# Patient Record
Sex: Male | Born: 1965 | Race: Black or African American | Hispanic: No | Smoking: Never smoker
Health system: Southern US, Community
[De-identification: ages and names within clinical notes are randomized; demographics above are authoritative.]

## PROBLEM LIST (undated history)

## (undated) DIAGNOSIS — I10 Essential (primary) hypertension: Secondary | ICD-10-CM

## (undated) DIAGNOSIS — I639 Cerebral infarction, unspecified: Secondary | ICD-10-CM

## (undated) DIAGNOSIS — E119 Type 2 diabetes mellitus without complications: Secondary | ICD-10-CM

## (undated) HISTORY — DX: Type 2 diabetes mellitus without complications: E11.9

## (undated) HISTORY — DX: Cerebral infarction, unspecified: I63.9

## (undated) HISTORY — PX: CHOLECYSTECTOMY: SHX55

---

## 2016-07-21 ENCOUNTER — Ambulatory Visit: Payer: Self-pay

## 2016-07-21 ENCOUNTER — Other Ambulatory Visit: Payer: Self-pay | Admitting: Occupational Medicine

## 2016-07-21 DIAGNOSIS — M79641 Pain in right hand: Secondary | ICD-10-CM

## 2019-08-31 ENCOUNTER — Encounter (HOSPITAL_COMMUNITY): Payer: Self-pay

## 2019-08-31 ENCOUNTER — Emergency Department (HOSPITAL_COMMUNITY)
Admission: EM | Admit: 2019-08-31 | Discharge: 2019-08-31 | Disposition: A | Discharge status: Home / Self Care | Payer: BC Managed Care – PPO | Attending: Emergency Medicine | Admitting: Emergency Medicine

## 2019-08-31 ENCOUNTER — Other Ambulatory Visit: Payer: Self-pay

## 2019-08-31 DIAGNOSIS — R509 Fever, unspecified: Secondary | ICD-10-CM | POA: Diagnosis not present

## 2019-08-31 DIAGNOSIS — M791 Myalgia, unspecified site: Secondary | ICD-10-CM | POA: Diagnosis not present

## 2019-08-31 DIAGNOSIS — Z20822 Contact with and (suspected) exposure to covid-19: Secondary | ICD-10-CM | POA: Insufficient documentation

## 2019-08-31 DIAGNOSIS — I10 Essential (primary) hypertension: Secondary | ICD-10-CM | POA: Insufficient documentation

## 2019-08-31 DIAGNOSIS — R519 Headache, unspecified: Secondary | ICD-10-CM | POA: Diagnosis not present

## 2019-08-31 DIAGNOSIS — Z79899 Other long term (current) drug therapy: Secondary | ICD-10-CM | POA: Diagnosis not present

## 2019-08-31 HISTORY — DX: Essential (primary) hypertension: I10

## 2019-08-31 LAB — SARS CORONAVIRUS 2 BY RT PCR (HOSPITAL ORDER, PERFORMED IN ~~LOC~~ HOSPITAL LAB): SARS Coronavirus 2: NEGATIVE

## 2019-08-31 MED ORDER — DOXYCYCLINE HYCLATE 100 MG PO CAPS: 100.0000 mg | ORAL_CAPSULE | Freq: Two times a day (BID) | ORAL | 0 refills | Status: AC

## 2019-08-31 MED ORDER — AMLODIPINE BESYLATE 10 MG PO TABS: 10.0000 mg | ORAL_TABLET | Freq: Every day | ORAL | 0 refills | Status: DC

## 2019-08-31 MED ORDER — ACETAMINOPHEN 500 MG PO TABS
1000.0000 mg | ORAL_TABLET | Freq: Once | ORAL | Status: AC
  Administered 2019-08-31: 18:00:00 1000 mg via ORAL
  Filled 2019-08-31: qty 2

## 2019-08-31 MED ORDER — AMLODIPINE BESYLATE 5 MG PO TABS
10.0000 mg | ORAL_TABLET | Freq: Once | ORAL | Status: AC
  Administered 2019-08-31: 18:00:00 10 mg via ORAL
  Filled 2019-08-31: qty 2

## 2019-08-31 MED ORDER — HYDROCHLOROTHIAZIDE 12.5 MG PO CAPS
25.0000 mg | ORAL_CAPSULE | Freq: Once | ORAL | Status: AC
  Administered 2019-08-31: 18:00:00 25 mg via ORAL
  Filled 2019-08-31: qty 2

## 2019-08-31 MED ORDER — HYDROCHLOROTHIAZIDE 25 MG PO TABS: 25.0000 mg | ORAL_TABLET | Freq: Every day | ORAL | 0 refills | Status: DC

## 2019-08-31 NOTE — Discharge Instructions (Addendum)
You were given a prescription for antibiotics. Please take the antibiotic prescription fully.   Please follow up with your primary care provider within 5-7 days for re-evaluation of your symptoms. If you do not have a primary care provider, information for a healthcare clinic has been provided for you to make arrangements for follow up care. Please return to the emergency department for any new or worsening symptoms.  

## 2019-08-31 NOTE — ED Triage Notes (Signed)
She c/o aches and uri sx x 1-2 days. She is in no distress.

## 2019-08-31 NOTE — ED Provider Notes (Signed)
[facility]-EMERGENCY DEPT Provider Note   CSN: [id number] Arrival date & time: 08/31/19  1521     History Chief Complaint  Patient presents with   Fever    [name] [name] is a 54 y.o. male.  HPI   Pt is a 54 y/o male who history of gout, hypertension, who presents the emergency department today for evaluation of generalized body aches and headache.  States symptoms started about a week ago, headache located to the right side of the head.  Symptoms started gradually and have been intermittent since onset.  Rates pain 5-6/10.  Denies worst headache of life. Denies vision changes, facial droop, speech difficulty, numbness/weakness, dizziness, lightheadedness. Denies rhinorrhea, congestion, sore throat, loss of taste and smell, cough, chest pain, shortness of breath, NVD, abd pain, dysuria, frequency.  Denies h/o IVDU or HIV  Past Medical History:  Diagnosis Date   Hypertension     There are no problems to display for this patient.   Past Surgical History:  Procedure Laterality Date   CHOLECYSTECTOMY         History reviewed. No pertinent family history.  Social History   Tobacco Use   Smoking status: Never Smoker   Smokeless tobacco: Never Used  Substance Use Topics   Alcohol use: Never   Drug use: Not on file    Home Medications Prior to Admission medications   Medication Sig Start Date End Date Taking? Authorizing Provider  amLODipine (NORVASC) 10 MG tablet Take 1 tablet (10 mg total) by mouth daily. 08/31/19 09/30/19  Ethyn Schetter S, PA-C  doxycycline (VIBRAMYCIN) 100 MG capsule Take 1 capsule (100 mg total) by mouth 2 (two) times daily for 7 days. 08/31/19 09/07/19  Laveyah Oriol S, PA-C  hydrochlorothiazide (HYDRODIURIL) 25 MG tablet Take 1 tablet (25 mg total) by mouth daily. 08/31/19 09/30/19  Calayah Guadarrama S, PA-C    Allergies    Patient has no known allergies.  Review of Systems   Review of Systems  Constitutional: Negative  for fever.  HENT: Negative for ear pain and sore throat.   Eyes: Negative for visual disturbance.  Respiratory: Negative for cough and shortness of breath.   Cardiovascular: Negative for chest pain.  Gastrointestinal: Negative for abdominal pain, constipation, diarrhea, nausea and vomiting.  Genitourinary: Negative for dysuria and hematuria.  Musculoskeletal: Positive for myalgias.  Skin: Negative for rash.  Neurological: Positive for headaches. Negative for dizziness, syncope, speech difficulty, weakness, light-headedness and numbness.  All other systems reviewed and are negative.   Physical Exam Updated Vital Signs BP (!) 158/118 (BP Location: Left Arm)    Pulse 98    Temp 100.2 F (37.9 C) (Oral)    Resp 18    Ht 6' (1.829 m)    Wt (!) 157.3 kg    SpO2 96%    BMI 47.03 kg/m   Physical Exam Vitals and nursing note reviewed.  Constitutional:      Appearance: He is well-developed.  HENT:     Head: Normocephalic and atraumatic.  Eyes:     Extraocular Movements: Extraocular movements intact.     Conjunctiva/sclera: Conjunctivae normal.     Pupils: Pupils are equal, round, and reactive to light.  Neck:     Comments: No nuchal rigidity Cardiovascular:     Rate and Rhythm: Normal rate and regular rhythm.     Heart sounds: Normal heart sounds. No murmur heard.   Pulmonary:     Effort: Pulmonary effort is normal. No respiratory distress.  Breath sounds: Normal breath sounds. No wheezing, rhonchi or rales.  Abdominal:     General: Bowel sounds are normal.     Palpations: Abdomen is soft.     Tenderness: There is no abdominal tenderness. There is no guarding or rebound.  Musculoskeletal:     Cervical back: Neck supple.  Skin:    General: Skin is warm and dry.  Neurological:     Mental Status: He is alert.     Comments: Mental Status:  Alert, thought content appropriate, able to give a coherent history. Speech fluent without evidence of aphasia. Able to follow 2 step  commands without difficulty.  Cranial Nerves:  II: pupils equal, round, reactive to light III,IV, VI: ptosis not present, extra-ocular motions intact bilaterally  V,VII: smile symmetric, facial light touch sensation equal VIII: hearing grossly normal to voice  X: uvula elevates symmetrically  XI: bilateral shoulder shrug symmetric and strong XII: midline tongue extension without fassiculations Motor:  Normal tone. 5/5 strength of BUE and BLE major muscle groups including strong and equal grip strength and dorsiflexion/plantar flexion Sensory: light touch normal in all extremities.      ED Results / Procedures / Treatments   Labs (all labs ordered are listed, but only abnormal results are displayed) Labs Reviewed  SARS CORONAVIRUS 2 BY RT PCR (HOSPITAL ORDER, PERFORMED IN [facility])    EKG None  Radiology No results found.  Procedures Procedures (including critical care time)  Medications Ordered in ED Medications  amLODipine (NORVASC) tablet 10 mg (10 mg Oral Given 08/31/19 1748)  hydrochlorothiazide (MICROZIDE) capsule 25 mg (25 mg Oral Given 08/31/19 1748)  acetaminophen (TYLENOL) tablet 1,000 mg (1,000 mg Oral Given 08/31/19 1748)    ED Course  I have reviewed the triage vital signs and the nursing notes.  Pertinent labs & imaging results that were available during my care of the patient were reviewed by me and considered in my medical decision making (see chart for details).    MDM Rules/Calculators/A&P                          54 year old male presenting for evaluation of headache and body aches for the last week.  Denies any other associated symptoms.  Was noted to have significantly elevated blood pressure initially.  Was also noted to be borderline febrile and marginally tachycardic.  Reviewed records per care everywhere, patient has had hypertension at multiple of his prior visits in the same range as noted today.  He reports he has been out of his  blood pressure medications for the last 3 months due to traveling for his job.  He is due to see his PCP in 5 days.  Denies any neurologic complaints other than headache.  Neuro exam is normal.  No chest pain, shortness of breath, or other symptoms to suggest hypertensive emergency at this time.  Suspect that he may have viral illness such as coronavirus and will test for such.  We will also administer his blood pressure medications and give Tylenol.  Will recheck BP.  COVID neg  Rechecked BP after meds/tylenol and BP improving. HA also improving. States he may have been exposed to a tick bite as he noticed an abnormality to the skin on his right thigh a few days ago and pulled something off of it. We will cover his from [facility] with doxy. He does not have any other infectious sxs at this time and is well  appearing on exam. Have advised close f/u with pcp and strict return precautions. He voices understanding of the plan and reasons to return. All questions answered, pt stable for discharge.   Final Clinical Impression(s) / ED Diagnoses Final diagnoses:  Febrile illness    Rx / DC Orders ED Discharge Orders         Ordered    amLODipine (NORVASC) 10 MG tablet  Daily     Discontinue  Reprint     08/31/19 1824    hydrochlorothiazide (HYDRODIURIL) 25 MG tablet  Daily     Discontinue  Reprint     08/31/19 1824    doxycycline (VIBRAMYCIN) 100 MG capsule  2 times daily     Discontinue  Reprint     08/31/19 1824           Sabreena Vogan S, PA-C 08/31/19 1826    Alvira Monday, MD 09/02/19 2056

## 2020-02-19 ENCOUNTER — Encounter (HOSPITAL_COMMUNITY): Payer: Self-pay

## 2020-02-19 ENCOUNTER — Other Ambulatory Visit: Payer: Self-pay

## 2020-02-19 DIAGNOSIS — Z79899 Other long term (current) drug therapy: Secondary | ICD-10-CM | POA: Diagnosis not present

## 2020-02-19 DIAGNOSIS — L03116 Cellulitis of left lower limb: Secondary | ICD-10-CM | POA: Diagnosis not present

## 2020-02-19 DIAGNOSIS — U071 COVID-19: Secondary | ICD-10-CM | POA: Insufficient documentation

## 2020-02-19 DIAGNOSIS — I10 Essential (primary) hypertension: Secondary | ICD-10-CM | POA: Insufficient documentation

## 2020-02-19 DIAGNOSIS — R059 Cough, unspecified: Secondary | ICD-10-CM | POA: Diagnosis present

## 2020-02-19 LAB — CBC WITH DIFFERENTIAL/PLATELET
Abs Immature Granulocytes: 0.04 10*3/uL (ref 0.00–0.07)
Basophils Absolute: 0 10*3/uL (ref 0.0–0.1)
Basophils Relative: 0 %
Eosinophils Absolute: 0.1 10*3/uL (ref 0.0–0.5)
Eosinophils Relative: 1 %
HCT: 40.5 % (ref 39.0–52.0)
Hemoglobin: 13.5 g/dL (ref 13.0–17.0)
Immature Granulocytes: 1 %
Lymphocytes Relative: 17 %
Lymphs Abs: 1.1 10*3/uL (ref 0.7–4.0)
MCH: 30 pg (ref 26.0–34.0)
MCHC: 33.3 g/dL (ref 30.0–36.0)
MCV: 90 fL (ref 80.0–100.0)
Monocytes Absolute: 0.6 10*3/uL (ref 0.1–1.0)
Monocytes Relative: 9 %
Neutro Abs: 4.6 10*3/uL (ref 1.7–7.7)
Neutrophils Relative %: 72 %
Platelets: 203 10*3/uL (ref 150–400)
RBC: 4.5 MIL/uL (ref 4.22–5.81)
RDW: 13.7 % (ref 11.5–15.5)
WBC: 6.4 10*3/uL (ref 4.0–10.5)
nRBC: 0 % (ref 0.0–0.2)

## 2020-02-19 LAB — COMPREHENSIVE METABOLIC PANEL
ALT: 61 U/L — ABNORMAL HIGH (ref 0–44)
AST: 58 U/L — ABNORMAL HIGH (ref 15–41)
Albumin: 3.4 g/dL — ABNORMAL LOW (ref 3.5–5.0)
Alkaline Phosphatase: 61 U/L (ref 38–126)
Anion gap: 11 (ref 5–15)
BUN: 22 mg/dL — ABNORMAL HIGH (ref 6–20)
CO2: 26 mmol/L (ref 22–32)
Calcium: 8.6 mg/dL — ABNORMAL LOW (ref 8.9–10.3)
Chloride: 100 mmol/L (ref 98–111)
Creatinine, Ser: 1.68 mg/dL — ABNORMAL HIGH (ref 0.61–1.24)
GFR, Estimated: 48 mL/min — ABNORMAL LOW (ref >60)
Glucose, Bld: 220 mg/dL — ABNORMAL HIGH (ref 70–99)
Potassium: 4 mmol/L (ref 3.5–5.1)
Sodium: 137 mmol/L (ref 135–145)
Total Bilirubin: 1.1 mg/dL (ref 0.3–1.2)
Total Protein: 7.6 g/dL (ref 6.5–8.1)

## 2020-02-19 LAB — LACTIC ACID, PLASMA
Lactic Acid, Venous: 1.2 mmol/L (ref 0.5–1.9)
Lactic Acid, Venous: 1 mmol/L (ref 0.5–1.9)

## 2020-02-19 NOTE — ED Triage Notes (Signed)
Pt arrived via walk in, c/o left leg wound infection. States he had two open areas from work. Was seen at [facility], given abx, last dose x2 days ago. States it is draining the last couple 24 hrs.

## 2020-02-20 ENCOUNTER — Emergency Department (HOSPITAL_COMMUNITY)
Admission: EM | Admit: 2020-02-20 | Discharge: 2020-02-20 | Disposition: A | Discharge status: Home / Self Care | Payer: BC Managed Care – PPO | Attending: Emergency Medicine | Admitting: Emergency Medicine

## 2020-02-20 DIAGNOSIS — R0602 Shortness of breath: Secondary | ICD-10-CM

## 2020-02-20 DIAGNOSIS — R509 Fever, unspecified: Secondary | ICD-10-CM

## 2020-02-20 DIAGNOSIS — L03116 Cellulitis of left lower limb: Secondary | ICD-10-CM

## 2020-02-20 LAB — SARS CORONAVIRUS 2 (TAT 6-24 HRS): SARS Coronavirus 2: POSITIVE — AB

## 2020-02-20 MED ORDER — DOXYCYCLINE HYCLATE 100 MG PO TABS
100.0000 mg | ORAL_TABLET | Freq: Once | ORAL | Status: AC
  Administered 2020-02-20: 100 mg via ORAL
  Filled 2020-02-20: qty 1

## 2020-02-20 MED ORDER — DOXYCYCLINE HYCLATE 100 MG PO CAPS: 100.0000 mg | ORAL_CAPSULE | Freq: Two times a day (BID) | ORAL | 0 refills | Status: AC

## 2020-02-20 NOTE — ED Provider Notes (Signed)
[facility]-EMERGENCY DEPT Provider Note   CSN: [id number] Arrival date & time: 02/19/20  1516     History Chief Complaint  Patient presents with  . Wound Infection    [name] [name] is a 55 y.o. male with a history of hypertension who presents the emergency department with a chief complaint of left leg wound.  The patient reports that he sustained 2 cuts on his left lower leg while he was at work on December 12.  He was cleaning the area with alcohol and peroxide, but the wound did not improve so he went to urgent care on December 19 and was started on a 10-day course of Keflex.  He reports that he was compliant with the course of Keflex, but has continued to have sharp, burning, nonradiating pain to the wound on the left lower leg and the wound has not healed.  He denies redness, warmth, red streaking up the leg, numbness, weakness, or discharge from the wound.  He denies a history of previous nonhealing wounds.  No history of diabetes mellitus.  He also notes that he had a fever of 100.4 on 01/04.  He also reports that he has had some mild shortness of breath over the last week and a nonproductive cough.  No loss of sense of taste or smell, chest pain, abdominal pain, leg swelling, sore throat, or other URI symptoms.  He is unvaccinated against COVID-19.  He has unsure of any known, sick contacts.   The history is provided by the patient and medical records. No language interpreter was used.       Past Medical History:  Diagnosis Date  . Hypertension     There are no problems to display for this patient.   Past Surgical History:  Procedure Laterality Date  . CHOLECYSTECTOMY         History reviewed. No pertinent family history.  Social History   Tobacco Use  . Smoking status: Never Smoker  . Smokeless tobacco: Never Used  Substance Use Topics  . Alcohol use: Never    Home Medications Prior to Admission medications   Medication Sig Start Date End  Date Taking? Authorizing Provider  doxycycline (VIBRAMYCIN) 100 MG capsule Take 1 capsule (100 mg total) by mouth 2 (two) times daily for 7 days. 02/20/20 02/27/20 Yes Naveh Rickles A, PA-C  amLODipine (NORVASC) 10 MG tablet Take 1 tablet (10 mg total) by mouth daily. 08/31/19 09/30/19  Couture, Cortni S, PA-C  hydrochlorothiazide (HYDRODIURIL) 25 MG tablet Take 1 tablet (25 mg total) by mouth daily. 08/31/19 09/30/19  Couture, Cortni S, PA-C    Allergies    Patient has no known allergies.  Review of Systems   Review of Systems  Constitutional: Positive for fever. Negative for appetite change.  HENT: Negative for congestion and sore throat.   Respiratory: Positive for cough and shortness of breath. Negative for wheezing.   Cardiovascular: Negative for chest pain.  Gastrointestinal: Negative for abdominal pain, constipation, diarrhea, nausea and vomiting.  Genitourinary: Negative for dysuria.  Musculoskeletal: Positive for myalgias. Negative for arthralgias, back pain and gait problem.  Skin: Positive for wound. Negative for color change and rash.  Allergic/Immunologic: Negative for immunocompromised state.  Neurological: Negative for syncope, weakness, numbness and headaches.  Psychiatric/Behavioral: Negative for confusion.    Physical Exam Updated Vital Signs BP (!) 185/106 (BP Location: Left Arm)   Pulse 97   Temp 99.3 F (37.4 C) (Oral)   Resp (!) 27   Ht 6' (  1.829 m)   Wt (!) 166.5 kg   SpO2 95%   BMI 49.77 kg/m   Physical Exam Vitals and nursing note reviewed.  Constitutional:      Appearance: He is well-developed.     Comments: Morbidly obese  HENT:     Head: Normocephalic.  Eyes:     Conjunctiva/sclera: Conjunctivae normal.  Cardiovascular:     Rate and Rhythm: Normal rate and regular rhythm.     Heart sounds: No murmur heard.   Pulmonary:     Effort: Pulmonary effort is normal. No respiratory distress.     Breath sounds: Normal breath sounds. No stridor. No  wheezing, rhonchi or rales.     Comments: Lungs are clear to auscultation bilaterally.  No increased work of breathing. Chest:     Chest wall: No tenderness.  Abdominal:     General: There is no distension.     Palpations: Abdomen is soft.  Musculoskeletal:     Cervical back: Neck supple.     Comments: Chronic appearing lymphedema noted to the bilateral lower extremities.  3+ pitting edema bilaterally.  Neurovascularly intact throughout the bilateral lower extremities.  Skin:    General: Skin is warm and dry.     Comments: There are 2 superficial skin ulceration noted to the left lower leg with a small amount of surrounding erythema, but no warmth.  Erythema is not circumferential.  No red streaking.  No active drainage or drainage able to be expressed.  No fluctuance.  No induration.  Neurological:     Mental Status: He is alert.  Psychiatric:        Behavior: Behavior normal.          ED Results / Procedures / Treatments   Labs (all labs ordered are listed, but only abnormal results are displayed) Labs Reviewed  COMPREHENSIVE METABOLIC PANEL - Abnormal; Notable for the following components:      Result Value   Glucose, Bld 220 (*)    BUN 22 (*)    Creatinine, Ser 1.68 (*)    Calcium 8.6 (*)    Albumin 3.4 (*)    AST 58 (*)    ALT 61 (*)    GFR, Estimated 48 (*)    All other components within normal limits  SARS CORONAVIRUS 2 (TAT 6-24 HRS)  LACTIC ACID, PLASMA  LACTIC ACID, PLASMA  CBC WITH DIFFERENTIAL/PLATELET    EKG None  Radiology No results found.  Procedures Procedures (including critical care time)  Medications Ordered in ED Medications  doxycycline (VIBRA-TABS) tablet 100 mg (100 mg Oral Given 02/20/20 5621)    ED Course  I have reviewed the triage vital signs and the nursing notes.  Pertinent labs & imaging results that were available during my care of the patient were reviewed by me and considered in my medical decision making (see chart for  details).    MDM Rules/Calculators/A&P                          55 year old male with a history of hypertension who presents to the emergency department with 2 nonhealing wounds to the left lower leg after he was cut by a piece of glass on December 12.  He was treated with a 10-day course of Keflex, but wounds have not resolved.  He reports they have not been worsening, but he did have a fever 2 days ago.  Notably, he mentions mild shortness of breath that began  about a week ago and a nonproductive cough.  Labs have been reviewed and independently interpreted by me.  Glucose is 220.  Patient has no history of diabetes mellitus.  I have reviewed his recent A1c's.  He does report that he drank 2 sodas during his extended wait in the emergency department due to boarding.  Creatinine is 1.68, stable from previous.  He has no leukocytosis.  Lactate is normal.  I performed a bedside ultrasound, but was unable to save the images.  There is a small amount of cobblestoning, but no fluid collection concerning for an abscess.  I think it is reasonable to treat the patient with a course of Doxy cyclin and have him follow closely with primary care and/or wound care.  In regard to his fever, I have a low suspicion that it is from his left leg wound as he reports that it has not been worsening, but just has not resolved.  Given that he has had some mild shortness of breath and cough over the last week, I am suspicious for COVID-19 and a COVID-19 test has been ordered and is pending.  He has had no chest pain.  Patient is aware that test is pending.  He has been advised to check his MyChart account or that he may receive a call from the hospital if his test is positive.  He has been advised that he must remain out of work until he receives the results of his COVID-19 test.  Doubt abscess, myositis, osteomyelitis, bacteremia.  His first dose of doxycycline has been administered in the ER.  Home wound care  instructions discussed.  He is hemodynamically stable and in no acute distress.  Safe for discharge to home with outpatient follow-up as indicated.   Final Clinical Impression(s) / ED Diagnoses Final diagnoses:  Cellulitis of left leg  Fever, unspecified fever cause  Shortness of breath    Rx / DC Orders ED Discharge Orders         Ordered    doxycycline (VIBRAMYCIN) 100 MG capsule  2 times daily        02/20/20 0331           Joline Maxcy A, PA-C 02/20/20 0341    Fatima Blank, MD 02/20/20 1845

## 2020-02-20 NOTE — Discharge Instructions (Signed)
Thank you for allowing me to care for you today in the Emergency Department.   You are seen today for a wound on your left leg.  Your bedside ultrasound did not show an abscess.  You were given a dose of doxycycline.  You will be sent home with a 7-day course of doxycycline.  Take 1 tablet by mouth 2 times daily until you finish the entire course.  You can take 650 mg of Tylenol once every 6 hours for pain.  I would avoid NSAID medications, such as ibuprofen or naproxen, because this can be harmful to your kidneys.  Elevate your leg so that your toes are at or above the level of your nose to help with pain and swelling.  You can apply an ice pack or heating pad to the area for 15 to 20 minutes up to 3-4 times a day.  Gently clean the area with warm water and soap once daily.  Pat the area dry then cover it with a bandage.  Avoid putting peroxide and alcohol on the wound as this may cause worsening damage to the skin.  Please follow-up with primary care for a wound recheck.  However, if you finish the entire course of antibiotics and the wound does not start healing, you can follow-up with wound care.  Their office information is listed above.  Since he did have a fever 1 day earlier this week and have been having some mild shortness of breath over the last week, you were tested for COVID-19 today.  Your test result is pending.  You should receive a call from the hospital if the test is positive.  You can also check the results in MyChart.  Instructions on MyChart are attached along with your discharge paperwork.  If the test is positive, you must quarantine at home for 10 days after your symptoms began.  Since she started having some mild shortness of breath a week ago, you would need to quarantine for 10 days from that time.  If your test is negative, you can return to work on Friday.  If you do test positive for COVID-19, avoid public spaces.  Try to social distance at least 6 feet and wear a mask  around other individuals.  He should return to the emergency department if you start having persistent fevers with worsening shortness of breath, redness, worsening swelling to your leg, red streaking up the leg, thick, mucus-like drainage from the wound, or other new, concerning symptoms

## 2020-03-25 ENCOUNTER — Other Ambulatory Visit: Payer: Self-pay

## 2020-03-25 ENCOUNTER — Encounter (HOSPITAL_BASED_OUTPATIENT_CLINIC_OR_DEPARTMENT_OTHER): Payer: BC Managed Care – PPO | Attending: Physician Assistant | Admitting: Physician Assistant

## 2020-03-25 DIAGNOSIS — I872 Venous insufficiency (chronic) (peripheral): Secondary | ICD-10-CM | POA: Insufficient documentation

## 2020-03-25 DIAGNOSIS — E11622 Type 2 diabetes mellitus with other skin ulcer: Secondary | ICD-10-CM | POA: Insufficient documentation

## 2020-03-25 DIAGNOSIS — L97822 Non-pressure chronic ulcer of other part of left lower leg with fat layer exposed: Secondary | ICD-10-CM | POA: Diagnosis not present

## 2020-03-25 NOTE — Progress Notes (Addendum)
NICOLES, MONTESINO ([id number]) Visit Report for 03/25/2020 Chief Complaint Document Details Patient Name: Date of Service: [place], [place] 03/25/2020 9:00 A M Medical Record Number: [id number] Patient Account Number: [id number] Date of Birth/Sex: Treating RN: 1965/09/20 (55 y.o. Male) Zenaida Deed Primary Care Provider: [name] Other Clinician: Referring Provider: Treating Provider/Extender: [name] in Treatment: 0 Information Obtained from: Patient Chief Complaint Left LE Ulcers Electronic Signature(s) Signed: 03/25/2020 9:37:52 AM By: Lenda Kelp PA-C Entered By: Lenda Kelp on 03/25/2020 09:37:51 -------------------------------------------------------------------------------- Debridement Details Patient Name: Date of Service: [place], [place] 03/25/2020 9:00 A M Medical Record Number: [id number] Patient Account Number: [id number] Date of Birth/Sex: Treating RN: [birth year] (55 y.o. Male) Zenaida Deed Primary Care Provider: [name] Other Clinician: Referring Provider: Treating Provider/Extender: [name] in Treatment: 0 Debridement Performed for Assessment: Wound #1 Left,Proximal,Anterior Lower Leg Performed By: Physician Lenda Kelp, PA Debridement Type: Debridement Severity of Tissue Pre Debridement: Fat layer exposed Level of Consciousness (Pre-procedure): Awake and Alert Pre-procedure Verification/Time Out Yes - 09:40 Taken: Start Time: 09:43 Pain Control: Lidocaine 4% T opical Solution T Area Debrided (L x W): otal 1.5 (cm) x 1.7 (cm) = 2.55 (cm) Tissue and other material debrided: Viable, Non-Viable, Slough, Subcutaneous, Slough Level: Skin/Subcutaneous Tissue Debridement Description: Excisional Instrument: Curette Bleeding: Minimum Hemostasis Achieved: Pressure Procedural Pain: 6 Post Procedural Pain: 4 Response to Treatment: Procedure was tolerated well Level of Consciousness (Post- Awake  and Alert procedure): Post Debridement Measurements of Total Wound Length: (cm) 1.5 Width: (cm) 1.7 Depth: (cm) 0.1 Volume: (cm) 0.2 Character of Wound/Ulcer Post Debridement: Requires Further Debridement Severity of Tissue Post Debridement: Fat layer exposed Post Procedure Diagnosis Same as Pre-procedure Electronic Signature(s) Signed: 03/25/2020 5:10:26 PM By: Zenaida Deed RN, BSN Signed: 03/25/2020 6:51:30 PM By: Lenda Kelp PA-C Entered By: Zenaida Deed on 03/25/2020 09:45:46 -------------------------------------------------------------------------------- Debridement Details Patient Name: Date of Service: [place], [place] 03/25/2020 9:00 A M Medical Record Number: [id number] Patient Account Number: [id number] Date of Birth/Sex: Treating RN: Aug 18, 1965 (55 y.o. Male) Zenaida Deed Primary Care Provider: [name] Other Clinician: Referring Provider: Treating Provider/Extender: [name] Weeks in Treatment: 0 Debridement Performed for Assessment: Wound #2 Left,Distal,Anterior Lower Leg Performed By: Physician Lenda Kelp, PA Debridement Type: Debridement Severity of Tissue Pre Debridement: Fat layer exposed Level of Consciousness (Pre-procedure): Awake and Alert Pre-procedure Verification/Time Out Yes - 09:40 Taken: Start Time: 09:43 Pain Control: Lidocaine 4% T opical Solution T Area Debrided (L x W): otal 1 (cm) x 0.6 (cm) = 0.6 (cm) Tissue and other material debrided: Viable, Non-Viable, Slough, Subcutaneous, Slough Level: Skin/Subcutaneous Tissue Debridement Description: Excisional Instrument: Curette Bleeding: Minimum Hemostasis Achieved: Pressure End Time: 09:46 Procedural Pain: 6 Post Procedural Pain: 4 Response to Treatment: Procedure was tolerated well Level of Consciousness (Post- Awake and Alert procedure): Post Debridement Measurements of Total Wound Length: (cm) 1 Width: (cm) 0.6 Depth: (cm) 0.1 Volume: (cm)  0.047 Character of Wound/Ulcer Post Debridement: Requires Further Debridement Severity of Tissue Post Debridement: Fat layer exposed Post Procedure Diagnosis Same as Pre-procedure Electronic Signature(s) Signed: 03/25/2020 5:10:26 PM By: Zenaida Deed RN, BSN Signed: 03/25/2020 6:51:30 PM By: Lenda Kelp PA-C Entered By: Zenaida Deed on 03/25/2020 09:46:37 -------------------------------------------------------------------------------- HPI Details Patient Name: Date of Service: [place], [place] 03/25/2020 9:00 A M Medical Record Number: [id number] Patient Account Number: [id number] Date of Birth/Sex: Treating RN: 16-Jun-1965 (55 y.o. Male) Zenaida Deed Primary Care Provider:  [name] Other Clinician: Referring Provider: Treating Provider/Extender: [name] in Treatment: 0 History of Present Illness HPI Description: 03/25/2020 upon evaluation today patient appears to be doing somewhat poorly in regard to his left lower extremity on the anterior portion. He has 2 wounds here that are actually giving him quite a bit of pain. They occurred due to an injury on December 12 when he struck his anterior portion of the leg with a window. He was carrying this at the time. With that being said it was a little cut he thought it would heal he put a little bit antibiotic ointment was using peroxide. With that being said he tells me that on December 19 he actually went [facility] because it did not seem to be healing appropriately they give him Keflex told him to quit using the peroxide and it he was hoping that would be the end of it. Unfortunately 2 weeks later on January 5 he ended up in the ER at [facility] where they gave him doxycycline and recommended that he come here. He does have a history of diabetes mellitus type 2 his most recent hemoglobin A1c on October 2021 was 6.7. With that being said he tells me he does not take any medications for this currently. He  does also have a history of sleep apnea though his ischium is broken at the moment he does know he needs to call to get that fixed. He also has hypertension which has been higher due to the fact that his sleep apnea is not controlled he tells me. With regard to his wound I think the biggest issue we see here is really that he has significant swelling due to venous stasis that likely is limiting his ability to be able to heal appropriately. I think that we may need to see what we can do to improve his edema status. Electronic Signature(s) Signed: 03/25/2020 9:51:29 AM By: Lenda Kelp PA-C Entered By: Lenda Kelp on 03/25/2020 09:51:29 -------------------------------------------------------------------------------- Physical Exam Details Patient Name: Date of Service: [place], [place] 03/25/2020 9:00 A M Medical Record Number: [id number] Patient Account Number: [id number] Date of Birth/Sex: Treating RN: 02/06/1966 (55 y.o. Male) Zenaida Deed Primary Care Provider: [name] Other Clinician: Referring Provider: Treating Provider/Extender: [name] in Treatment: 0 Constitutional patient is hypertensive.. pulse regular and within target range for patient.Marland Kitchen respirations regular, non-labored and within target range for patient.Marland Kitchen temperature within target range for patient.. Well-nourished and well-hydrated in no acute distress. Eyes conjunctiva clear no eyelid edema noted. pupils equal round and reactive to light and accommodation. Ears, Nose, Mouth, and Throat no gross abnormality of ear auricles or external auditory canals. normal hearing noted during conversation. mucus membranes moist. Respiratory normal breathing without difficulty. Cardiovascular 2+ dorsalis pedis/posterior tibialis pulses. 2+ pitting edema of the bilateral lower extremities. Musculoskeletal normal gait and posture. no significant deformity or arthritic changes, no loss or range of  motion, no clubbing. Psychiatric this patient is able to make decisions and demonstrates good insight into disease process. Alert and Oriented x 3. pleasant and cooperative. Notes Upon inspection patient's wound bed actually did not require some sharp debridement due to the fact that he had some necrotic tissue on the surface of the wound. I did perform sharp debridement though unfortunately he was having some discomfort I was not able to completely clean this away. For that reason I am likely can initiate treatment with Iodoflex to  try to help clean this up a little better. Electronic Signature(s) Signed: 03/25/2020 9:52:14 AM By: Lenda Kelp PA-C Entered By: Lenda Kelp on 03/25/2020 09:52:14 -------------------------------------------------------------------------------- Physician Orders Details Patient Name: Date of Service: [place], [place] 03/25/2020 9:00 A M Medical Record Number: [id number] Patient Account Number: [id number] Date of Birth/Sex: Treating RN: August 03, 1965 (55 y.o. Male) Zenaida Deed Primary Care Provider: [name] Other Clinician: Referring Provider: Treating Provider/Extender: [name] in Treatment: 0 Verbal / Phone Orders: No Diagnosis Coding ICD-10 Coding Code Description I87.2 Venous insufficiency (chronic) (peripheral) L97.822 Non-pressure chronic ulcer of other part of left lower leg with fat layer exposed I10 Essential (primary) hypertension E11.622 Type 2 diabetes mellitus with other skin ulcer G47.30 Sleep apnea, unspecified Follow-up Appointments Return Appointment in 1 week. Bathing/ Shower/ Hygiene May shower with protection but do not get wound dressing(s) wet. - use cast protector to cover wrap in shower Edema Control - Lymphedema / SCD / Other Bilateral Lower Extremities Elevate legs to the level of the heart or above for 30 minutes daily and/or when sitting, a frequency of: - 3-4 times per day Avoid  standing for long periods of time. Exercise regularly Wound Treatment Wound #1 - Lower Leg Wound Laterality: Left, Anterior, Proximal Peri-Wound Care: Sween Lotion (Moisturizing lotion) 1 x Per Week/7 Days Discharge Instructions: Apply moisturizing lotion as directed Prim Dressing: IODOFLEX 0.9% Cadexomer Iodine Pad 4x6 cm 1 x Per Week/7 Days ary Discharge Instructions: Apply to wound bed as instructed Secondary Dressing: Woven Gauze Sponge, Non-Sterile 4x4 in 1 x Per Week/7 Days Discharge Instructions: Apply over primary dressing as directed. Compression Wrap: FourPress (4 layer compression wrap) 1 x Per Week/7 Days Discharge Instructions: Apply four layer compression as directed. Wound #2 - Lower Leg Wound Laterality: Left, Anterior, Distal Peri-Wound Care: Sween Lotion (Moisturizing lotion) 1 x Per Week/7 Days Discharge Instructions: Apply moisturizing lotion as directed Prim Dressing: IODOFLEX 0.9% Cadexomer Iodine Pad 4x6 cm 1 x Per Week/7 Days ary Discharge Instructions: Apply to wound bed as instructed Secondary Dressing: Woven Gauze Sponge, Non-Sterile 4x4 in 1 x Per Week/7 Days Discharge Instructions: Apply over primary dressing as directed. Compression Wrap: FourPress (4 layer compression wrap) 1 x Per Week/7 Days Discharge Instructions: Apply four layer compression as directed. Electronic Signature(s) Signed: 03/25/2020 5:10:26 PM By: Zenaida Deed RN, BSN Signed: 03/25/2020 6:51:30 PM By: Lenda Kelp PA-C Entered By: Zenaida Deed on 03/25/2020 09:48:57 -------------------------------------------------------------------------------- Problem List Details Patient Name: Date of Service: [place], [place] 03/25/2020 9:00 A M Medical Record Number: [id number] Patient Account Number: [id number] Date of Birth/Sex: Treating RN: 13-Feb-1966 (55 y.o. 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/10/2020 No Yes I10 Essential (primary) hypertension 03/25/2020 No Yes E11.622 Type 2 diabetes mellitus with other skin ulcer 03/25/2020 No Yes G47.30 Sleep apnea, unspecified 03/25/2020 No Yes Inactive Problems Resolved Problems Electronic Signature(s) Signed: 03/25/2020 9:37:40 AM By: Lenda Kelp PA-C Entered By: Lenda Kelp on 03/25/2020 09:37:40 -------------------------------------------------------------------------------- Progress Note Details Patient Name: Date of Service: [place], [place] 03/25/2020 9:00 A M Medical Record Number: [id number] Patient Account Number: [id number] Date of Birth/Sex: Treating RN: 1965/12/22 (55 y.o. Male) Zenaida Deed Primary Care  Provider: Azzie RoupAnderson, Shane Other Clinician: Referring Provider: Treating Provider/Extender: Julious PayerStone III, Reeves Musick Anderson, Shane Weeks in Treatment: 0 Subjective Chief Complaint Information obtained from Patient Left LE Ulcers History of Present Illness (HPI) 03/25/2020 upon evaluation today patient appears to be doing somewhat poorly in regard to his left lower extremity on the anterior portion. He has 2 wounds here that are actually giving him quite a bit of pain. They occurred due to an injury on December 12 when he struck his anterior portion of the leg with a window. He was carrying this at the time. With that being said it was a little cut he thought it would heal he put a little bit antibiotic ointment was using peroxide. With that being said he tells me that on December 19 [facility] because it did not seem to be healing appropriately they give him Keflex told him to quit using the peroxide and it he was hoping that would be  the end of it. Unfortunately 2 weeks later on January 5 he ended up in the ER at [facility] where they gave him doxycycline and recommended that he come here. He does have a history of diabetes mellitus type 2 his most recent hemoglobin A1c on October 2021 was 6.7. With that being said he tells me he does not take any medications for this currently. He does also have a history of sleep apnea though his ischium is broken at the moment he does know he needs to call to get that fixed. He also has hypertension which has been higher due to the fact that his sleep apnea is not controlled he tells me. With regard to his wound I think the biggest issue we see here is really that he has significant swelling due to venous stasis that likely is limiting his ability to be able to heal appropriately. I think that we may need to see what we can do to improve his edema status. Patient History Information obtained from Patient. Allergies prednisone (Reaction: nausea) Family History Cancer - Siblings, Diabetes - Siblings, Hypertension - Siblings,Father, Kidney Disease - Siblings, Stroke - Mother, No family history of Heart Disease, Hereditary Spherocytosis, Lung Disease, Seizures, Thyroid Problems, Tuberculosis. Social History Never smoker, Marital Status - Single, Alcohol Use - Rarely, Drug Use - No History, Caffeine Use - Moderate - tea. Medical History Respiratory Patient has history of Sleep Apnea Cardiovascular Patient has history of Hypertension Endocrine Patient has history of Type II Diabetes Musculoskeletal Patient has history of Gout Patient is treated with Controlled Diet. Blood sugar is not tested. Review of Systems (ROS) Constitutional Symptoms (General Health) Denies complaints or symptoms of Fatigue, Fever, Chills, Marked Weight Change. Eyes Complains or has symptoms of Glasses / Contacts - glasses. Ear/Nose/Mouth/Throat Denies complaints or symptoms of Chronic sinus problems or  rhinitis. Respiratory Denies complaints or symptoms of Chronic or frequent coughs, Shortness of Breath. Gastrointestinal Denies complaints or symptoms of Frequent diarrhea, Nausea, Vomiting. Endocrine Denies complaints or symptoms of Heat/cold intolerance. Genitourinary Denies complaints or symptoms of Frequent urination. Integumentary (Skin) Complains or has symptoms of Wounds - wound on left lower leg. Musculoskeletal Denies complaints or symptoms of Muscle Pain, Muscle Weakness. Neurologic Denies complaints or symptoms of Numbness/parasthesias. Psychiatric Denies complaints or symptoms of Claustrophobia, Suicidal. Objective Constitutional patient is hypertensive.. pulse regular and within target range for patient.Marland Kitchen. respirations regular, non-labored and within target range for patient.Marland Kitchen. temperature within target range for patient.. Well-nourished and well-hydrated in no acute distress. Vitals Time  Taken: 8:58 AM, Height: 72 in, Source: Stated, Weight: 360 lbs, Source: Stated, BMI: 48.8, Temperature: 99.4 F. Eyes conjunctiva clear no eyelid edema noted. pupils equal round and reactive to light and accommodation. Ears, Nose, Mouth, and Throat no gross abnormality of ear auricles or external auditory canals. normal hearing noted during conversation. mucus membranes moist. Respiratory normal breathing without difficulty. Cardiovascular 2+ dorsalis pedis/posterior tibialis pulses. 2+ pitting edema of the bilateral lower extremities. Musculoskeletal normal gait and posture. no significant deformity or arthritic changes, no loss or range of motion, no clubbing. Psychiatric this patient is able to make decisions and demonstrates good insight into disease process. Alert and Oriented x 3. pleasant and cooperative. General Notes: Upon inspection patient's wound bed actually did not require some sharp debridement due to the fact that he had some necrotic tissue on the surface of the wound.  I did perform sharp debridement though unfortunately he was having some discomfort I was not able to completely clean this away. For that reason I am likely can initiate treatment with Iodoflex to try to help clean this up a little better. Integumentary (Hair, Skin) Wound #1 status is Open. Original cause of wound was Trauma. The wound is located on the Left,Proximal,Anterior Lower Leg. The wound measures 1.5cm length x 1.7cm width x 0.1cm depth; 2.003cm^2 area and 0.2cm^3 volume. There is Fat Layer (Subcutaneous Tissue) exposed. There is no tunneling or undermining noted. There is a medium amount of serosanguineous drainage noted. The wound margin is flat and intact. There is medium (34-66%) pink granulation within the wound bed. There is a medium (34-66%) amount of necrotic tissue within the wound bed including Adherent Slough. Wound #2 status is Open. Original cause of wound was Trauma. The wound is located on the [facility] Lower Leg. The wound measures 1cm length x 0.6cm width x 0.1cm depth; 0.471cm^2 area and 0.047cm^3 volume. There is Fat Layer (Subcutaneous Tissue) exposed. There is no tunneling or undermining noted. There is a medium amount of serosanguineous drainage noted. The wound margin is flat and intact. There is medium (34-66%) pink granulation within the wound bed. There is a medium (34-66%) amount of necrotic tissue within the wound bed including Adherent Slough. Assessment Active Problems ICD-10 Venous insufficiency (chronic) (peripheral) Non-pressure chronic ulcer of other part of left lower leg with fat layer exposed Essential (primary) hypertension Type 2 diabetes mellitus with other skin ulcer Sleep apnea, unspecified Procedures Wound #1 Pre-procedure diagnosis of Wound #1 is a Venous Leg Ulcer located on the Left,Proximal,Anterior Lower Leg .Severity of Tissue Pre Debridement is: Fat layer exposed. There was a Excisional Skin/Subcutaneous Tissue Debridement  with a total area of 2.55 sq cm performed by Lenda Kelp, PA. With the following instrument(s): Curette to remove Viable and Non-Viable tissue/material. Material removed includes Subcutaneous Tissue and Slough and after achieving pain control using Lidocaine 4% T opical Solution. No specimens were taken. A time out was conducted at 09:40, prior to the start of the procedure. A Minimum amount of bleeding was controlled with Pressure. The procedure was tolerated well with a pain level of 6 throughout and a pain level of 4 following the procedure. Post Debridement Measurements: 1.5cm length x 1.7cm width x 0.1cm depth; 0.2cm^3 volume. Character of Wound/Ulcer Post Debridement requires further debridement. Severity of Tissue Post Debridement is: Fat layer exposed. Post procedure Diagnosis Wound #1: Same as Pre-Procedure Pre-procedure diagnosis of Wound #1 is a Venous Leg Ulcer located on the Left,Proximal,Anterior Lower Leg . There was a Four  Layer Compression Therapy Procedure by Fonnie Mu, RN. Post procedure Diagnosis Wound #1: Same as Pre-Procedure Wound #2 Pre-procedure diagnosis of Wound #2 is a Venous Leg Ulcer located on the Left,Distal,Anterior Lower Leg .Severity of Tissue Pre Debridement is: Fat layer exposed. There was a Excisional Skin/Subcutaneous Tissue Debridement with a total area of 0.6 sq cm performed by Lenda Kelp, PA. With the following instrument(s): Curette to remove Viable and Non-Viable tissue/material. Material removed includes Subcutaneous Tissue and Slough and after achieving pain control using Lidocaine 4% T opical Solution. No specimens were taken. A time out was conducted at 09:40, prior to the start of the procedure. A Minimum amount of bleeding was controlled with Pressure. The procedure was tolerated well with a pain level of 6 throughout and a pain level of 4 following the procedure. Post Debridement Measurements: 1cm length x 0.6cm width x 0.1cm depth;  0.047cm^3 volume. Character of Wound/Ulcer Post Debridement requires further debridement. Severity of Tissue Post Debridement is: Fat layer exposed. Post procedure Diagnosis Wound #2: Same as Pre-Procedure Pre-procedure diagnosis of Wound #2 is a Venous Leg Ulcer located on the Left,Distal,Anterior Lower Leg . There was a Four Layer Compression Therapy Procedure by Fonnie Mu, RN. Post procedure Diagnosis Wound #2: Same as Pre-Procedure Plan Follow-up Appointments: Return Appointment in 1 week. Bathing/ Shower/ Hygiene: May shower with protection but do not get wound dressing(s) wet. - use cast protector to cover wrap in shower Edema Control - Lymphedema / SCD / Other: Elevate legs to the level of the heart or above for 30 minutes daily and/or when sitting, a frequency of: - 3-4 times per day Avoid standing for long periods of time. Exercise regularly WOUND #1: - Lower Leg Wound Laterality: Left, Anterior, Proximal Peri-Wound Care: Sween Lotion (Moisturizing lotion) 1 x Per Week/7 Days Discharge Instructions: Apply moisturizing lotion as directed Prim Dressing: IODOFLEX 0.9% Cadexomer Iodine Pad 4x6 cm 1 x Per Week/7 Days ary Discharge Instructions: Apply to wound bed as instructed Secondary Dressing: Woven Gauze Sponge, Non-Sterile 4x4 in 1 x Per Week/7 Days Discharge Instructions: Apply over primary dressing as directed. Com pression Wrap: FourPress (4 layer compression wrap) 1 x Per Week/7 Days Discharge Instructions: Apply four layer compression as directed. WOUND #2: - Lower Leg Wound Laterality: Left, Anterior, Distal Peri-Wound Care: Sween Lotion (Moisturizing lotion) 1 x Per Week/7 Days Discharge Instructions: Apply moisturizing lotion as directed Prim Dressing: IODOFLEX 0.9% Cadexomer Iodine Pad 4x6 cm 1 x Per Week/7 Days ary Discharge Instructions: Apply to wound bed as instructed Secondary Dressing: Woven Gauze Sponge, Non-Sterile 4x4 in 1 x Per Week/7  Days Discharge Instructions: Apply over primary dressing as directed. Com pression Wrap: FourPress (4 layer compression wrap) 1 x Per Week/7 Days Discharge Instructions: Apply four layer compression as directed. 1. Would recommend currently that we going continue with the recommendation for elevation. I do believe the patient is getting the compression stockings as we get closer to time will give him measurements for this. With that being said I do think that at this moment Iodoflex is good to be the best option as far as medication is concerned to help treat the ulceration. 2. Muscle can recommend a 4-layer compression wrap try to help keep edema under control. 3. I am also can recommend the patient should try to avoid standing long periods of time and elevate his legs much as possible I have no limitations on him being able to walk. We will see patient back for reevaluation in 1  week here in the clinic. If anything worsens or changes patient will contact our office for additional recommendations. Electronic Signature(s) Signed: 03/25/2020 9:52:51 AM By: Lenda Kelp PA-C Entered By: Lenda Kelp on 03/25/2020 09:52:51 -------------------------------------------------------------------------------- HxROS Details Patient Name: Date of Service: [place], [place] 03/25/2020 9:00 A M Medical Record Number: [id number] Patient Account Number: [id number] Date of Birth/Sex: Treating RN: [birth year] (55 y.o. Male) Zandra Abts Primary Care Provider: [name] Other Clinician: Referring Provider: Treating Provider/Extender: [name] in Treatment: 0 Information Obtained From Patient Constitutional Symptoms (General Health) Complaints and Symptoms: Negative for: Fatigue; Fever; Chills; Marked Weight Change Eyes Complaints and Symptoms: Positive for: Glasses / Contacts - glasses Ear/Nose/Mouth/Throat Complaints and Symptoms: Negative for: Chronic sinus  problems or rhinitis Respiratory Complaints and Symptoms: Negative for: Chronic or frequent coughs; Shortness of Breath Medical History: Positive for: Sleep Apnea Gastrointestinal Complaints and Symptoms: Negative for: Frequent diarrhea; Nausea; Vomiting Endocrine Complaints and Symptoms: Negative for: Heat/cold intolerance Medical History: Positive for: Type II Diabetes Treated with: Diet Blood sugar tested every day: No Genitourinary Complaints and Symptoms: Negative for: Frequent urination Integumentary (Skin) Complaints and Symptoms: Positive for: Wounds - wound on left lower leg Musculoskeletal Complaints and Symptoms: Negative for: Muscle Pain; Muscle Weakness Medical History: Positive for: Gout Neurologic Complaints and Symptoms: Negative for: Numbness/parasthesias Psychiatric Complaints and Symptoms: Negative for: Claustrophobia; Suicidal Cardiovascular Medical History: Positive for: Hypertension Immunological Oncologic Immunizations Pneumococcal Vaccine: Received Pneumococcal Vaccination: No Implantable Devices None Family and Social History Cancer: Yes - Siblings; Diabetes: Yes - Siblings; Heart Disease: No; Hereditary Spherocytosis: No; Hypertension: Yes - Siblings,Father; Kidney Disease: Yes - Siblings; Lung Disease: No; Seizures: No; Stroke: Yes - Mother; Thyroid Problems: No; Tuberculosis: No; Never smoker; Marital Status - Single; Alcohol Use: Rarely; Drug Use: No History; Caffeine Use: Moderate - tea; Financial Concerns: No; Food, Clothing or Shelter Needs: No; Support System Lacking: No; Transportation Concerns: No Electronic Signature(s) Signed: 03/25/2020 5:02:12 PM By: Zandra Abts RN, BSN Signed: 03/25/2020 6:51:30 PM By: Lenda Kelp PA-C Entered By: Zandra Abts on 03/25/2020 09:12:35 -------------------------------------------------------------------------------- SuperBill Details Patient Name: Date of Service: [place], [place]  03/25/2020 Medical Record Number: [id number] Patient Account Number: [id number] Date of Birth/Sex: Treating RN: 05/07/1965 (55 y.o. Male) Zenaida Deed Primary Care Provider: [name] Other Clinician: Referring Provider: Treating Provider/Extender: [name] in Treatment: 0 Diagnosis Coding ICD-10 Codes Code Description I87.2 Venous insufficiency (chronic) (peripheral) L97.822 Non-pressure chronic ulcer of other part of left lower leg with fat layer exposed I10 Essential (primary) hypertension E11.622 Type 2 diabetes mellitus with other skin ulcer G47.30 Sleep apnea, unspecified Facility Procedures CPT4 Code: 12458099 Description: 99213 - WOUND CARE VISIT-LEV 3 EST PT Modifier: 25 Quantity: 1 CPT4 Code: 83382505 Description: 11042 - DEB SUBQ TISSUE 20 SQ CM/< ICD-10 Diagnosis Description L97.822 Non-pressure chronic ulcer of other part of left lower leg with fat layer expos Modifier: ed Quantity: 1 Physician Procedures : CPT4 Code Description Modifier [id number] WC PHYS LEVEL 3 NEW PT 25 ICD-10 Diagnosis Description I87.2 Venous insufficiency (chronic) (peripheral) L97.822 Non-pressure chronic ulcer of other part of left lower leg with fat layer exposed I10 Essential  (primary) hypertension E11.622 Type 2 diabetes mellitus with other skin ulcer Quantity: 1 : 1937902 11042 - WC PHYS SUBQ TISS 20 SQ CM ICD-10 Diagnosis Description L97.822 Non-pressure chronic ulcer of other part of left lower leg with fat layer exposed Quantity: 1 Electronic Signature(s) Signed: 03/26/2020 5:58:12 PM By: Burnadette Peter,  Emeterio Reeve RN, BSN Signed: 03/27/2020 4:22:56 PM By: Lenda Kelp PA-C Previous Signature: 03/25/2020 9:53:11 AM Version By: Lenda Kelp PA-C Entered By: Zandra Abts on 03/26/2020 09:57:01

## 2020-03-25 NOTE — Progress Notes (Signed)
ARTORIUS, EGELAND ([id number]) Visit Report for 03/25/2020 Allergy List Details Patient Name: Date of Service: [place], [place] 03/25/2020 9:00 A M Medical Record Number: [id number] Patient Account Number: [id number] Date of Birth/Sex: Treating RN: 03-02-1965 (55 y.o. Elizebeth Koller Primary Care Latorie Montesano: [name] Other Clinician: Referring Baby Stairs: Treating Seon Gaertner/Extender: [name] Weeks in Treatment: 0 Allergies Active Allergies prednisone Reaction: nausea Type: Food Allergy Notes Electronic Signature(s) Signed: 03/25/2020 5:02:12 PM By: Zandra Abts RN, BSN Entered By: Zandra Abts on 03/25/2020 09:02:14 -------------------------------------------------------------------------------- Arrival Information Details Patient Name: Date of Service: [place] 03/25/2020 9:00 A M Medical Record Number: [id number] Patient Account Number: [id number] Date of Birth/Sex: Treating RN: [birth year] (56 y.o. Elizebeth Koller Primary Care Demarie Uhlig: [name] Other Clinician: Referring Domonick Sittner: Treating Boubacar Lerette/Extender: [name] in Treatment: 0 Visit Information Patient Arrived: Ambulatory Arrival Time: 08:57 Accompanied By: alone Transfer Assistance: None Patient Identification Verified: Yes Secondary Verification Process Completed: Yes Patient Requires Transmission-Based Precautions: No Patient Has Alerts: Yes Patient Alerts: L ABI non compressible Electronic Signature(s) Signed: 03/25/2020 5:02:12 PM By: Zandra Abts RN, BSN Entered By: Zandra Abts on 03/25/2020 09:17:36 -------------------------------------------------------------------------------- Clinic Level of Care Assessment Details Patient Name: Date of Service: [place], [place] 03/25/2020 9:00 A M Medical Record Number: [id number] Patient Account Number: [id number] Date of Birth/Sex: Treating RN: [birth year] (55 y.o. [name] Primary Care Amberley Hamler:  [name] Other Clinician: Referring Jago Carton: Treating Darlisa Spruiell/Extender: [name] in Treatment: 0 Clinic Level of Care Assessment Items TOOL 1 Quantity Score []  - 0 Use when EandM and Procedure is performed on INITIAL visit ASSESSMENTS - Nursing Assessment / Reassessment X- 1 20 General Physical Exam (combine w/ comprehensive assessment (listed just below) when performed on new pt. evals) X- 1 25 Comprehensive Assessment (HX, ROS, Risk Assessments, Wounds Hx, etc.) ASSESSMENTS - Wound and Skin Assessment / Reassessment []  - 0 Dermatologic / Skin Assessment (not related to wound area) ASSESSMENTS - Ostomy and/or Continence Assessment and Care []  - 0 Incontinence Assessment and Management []  - 0 Ostomy Care Assessment and Management (repouching, etc.) PROCESS - Coordination of Care X - Simple Patient / Family Education for ongoing care 1 15 []  - 0 Complex (extensive) Patient / Family Education for ongoing care X- 1 10 Staff obtains Chiropractor, Records, T Results / Process Orders est []  - 0 Staff telephones HHA, Nursing Homes / Clarify orders / etc []  - 0 Routine Transfer to another Facility (non-emergent condition) []  - 0 Routine Hospital Admission (non-emergent condition) X- 1 15 New Admissions / Manufacturing engineer / Ordering NPWT Apligraf, etc. , []  - 0 Emergency Hospital Admission (emergent condition) PROCESS - Special Needs []  - 0 Pediatric / Minor Patient Management []  - 0 Isolation Patient Management []  - 0 Hearing / Language / Visual special needs []  - 0 Assessment of Community assistance (transportation, D/C planning, etc.) []  - 0 Additional assistance / Altered mentation []  - 0 Support Surface(s) Assessment (bed, cushion, seat, etc.) INTERVENTIONS - Miscellaneous []  - 0 External ear exam []  - 0 Patient Transfer (multiple staff / Nurse, adult / Similar devices) []  - 0 Simple Staple / Suture removal (25 or  less) []  - 0 Complex Staple / Suture removal (26 or more) []  - 0 Hypo/Hyperglycemic Management (do not check if billed separately) X- 1 15 Ankle / Brachial Index (ABI) - do not check if billed separately Has the patient been seen at the hospital within the  last three years: Yes Total Score: 100 Level Of Care: New/Established - Level 3 Electronic Signature(s) Signed: 03/25/2020 5:10:26 PM By: Zenaida Deed RN, BSN Entered By: Zenaida Deed on 03/25/2020 09:44:18 -------------------------------------------------------------------------------- Compression Therapy Details Patient Name: Date of Service: [place], [place] 03/25/2020 9:00 A M Medical Record Number: [id number] Patient Account Number: [id number] Date of Birth/Sex: Treating RN: 1965/05/19 (55 y.o. [name] Primary Care Tiasia Weberg: [name] Other Clinician: Referring Tamala Manzer: Treating Kierre Deines/Extender: [name] in Treatment: 0 Compression Therapy Performed for Wound Assessment: Wound #1 Left,Proximal,Anterior Lower Leg Performed By: Clinician Fonnie Mu, RN Compression Type: Four Layer Post Procedure Diagnosis Same as Pre-procedure Electronic Signature(s) Signed: 03/25/2020 5:10:26 PM By: Zenaida Deed RN, BSN Entered By: Zenaida Deed on 03/25/2020 09:50:34 -------------------------------------------------------------------------------- Compression Therapy Details Patient Name: Date of Service: [place], [place] 03/25/2020 9:00 A M Medical Record Number: [id number] Patient Account Number: [id number] Date of Birth/Sex: Treating RN: 10-14-1965 (55 y.o. [name] Primary Care Zamiyah Resendes: [name] Other Clinician: Referring Torrin Crihfield: Treating Shaneequa Bahner/Extender: [name] in Treatment: 0 Compression Therapy Performed for Wound Assessment: Wound #2 Left,Distal,Anterior Lower Leg Performed By: Clinician Fonnie Mu,  RN Compression Type: Four Layer Post Procedure Diagnosis Same as Pre-procedure Electronic Signature(s) Signed: 03/25/2020 5:10:26 PM By: Zenaida Deed RN, BSN Entered By: Zenaida Deed on 03/25/2020 09:50:34 -------------------------------------------------------------------------------- Encounter Discharge Information Details Patient Name: Date of Service: [place], TRA [place] 03/25/2020 9:00 A M Medical Record Number: [id number] Patient Account Number: [id number] Date of Birth/Sex: Treating RN: March 27, 1965 (55 y.o. [name], [name] Primary Care Perry Brucato: [name] Other Clinician: Referring Kaylan Yates: Treating Cheyne Bungert/Extender: [name] in Treatment: 0 Encounter Discharge Information Items Post Procedure Vitals Discharge Condition: Stable Temperature (F): 99.4 Ambulatory Status: Ambulatory Pulse (bpm): 102 Discharge Destination: Home Respiratory Rate (breaths/min): 17 Transportation: Private Auto Blood Pressure (mmHg): 195/115 Accompanied By: self Schedule Follow-up Appointment: Yes Clinical Summary of Care: Patient Declined Electronic Signature(s) Signed: 03/25/2020 5:02:07 PM By: Fonnie Mu RN Entered By: Fonnie Mu on 03/25/2020 10:30:02 -------------------------------------------------------------------------------- Lower Extremity Assessment Details Patient Name: Date of Service: [place], [place] 03/25/2020 9:00 A M Medical Record Number: [id number] Patient Account Number: [id number] Date of Birth/Sex: Treating RN: 27-Mar-1965 (55 y.o. Elizebeth Koller Primary Care Andrei Mccook: [name] Other Clinician: Referring Wynnie Pacetti: Treating Temprance Wyre/Extender: [name] Weeks in Treatment: 0 Edema Assessment Assessed: [Left: No] [Right: No] Edema: [Left: Ye] [Right: s] Calf Left: Right: Point of Measurement: 40 cm From Medial Instep 52.5 cm Ankle Left: Right: Point of Measurement: 10 cm From Medial  Instep 32.8 cm Knee To Floor Left: Right: From Medial Instep 46 cm Vascular Assessment Pulses: Dorsalis Pedis Palpable: [Left:Yes] Electronic Signature(s) Signed: 03/25/2020 5:02:07 PM By: Fonnie Mu RN Signed: 03/25/2020 5:02:12 PM By: Zandra Abts RN, BSN Entered By: Fonnie Mu on 03/25/2020 10:12:52 -------------------------------------------------------------------------------- Multi-Disciplinary Care Plan Details Patient Name: Date of Service: [place], [place] 03/25/2020 9:00 A M Medical Record Number: [id number] Patient Account Number: [id number] Date of Birth/Sex: Treating RN: September 15, 1965 (55 y.o. [name] Primary Care Verlin Duke: [name] Other Clinician: Referring Damond Borchers: Treating Aimee Timmons/Extender: [name] in Treatment: 0 Active Inactive Nutrition Nursing Diagnoses: Impaired glucose control: actual or potential Potential for alteratiion in Nutrition/Potential for imbalanced nutrition Goals: Patient/caregiver will maintain therapeutic glucose control Date Initiated: 03/25/2020 Target Resolution Date: 04/22/2020 Goal Status: Active Interventions: Assess HgA1c results as ordered upon admission and as needed Assess patient  nutrition upon admission and as needed per policy Provide education on elevated blood sugars and impact on wound healing Treatment Activities: Patient referred to Primary Care Physician for further nutritional evaluation : 03/25/2020 Notes: Venous Leg Ulcer Nursing Diagnoses: Knowledge deficit related to disease process and management Potential for venous Insuffiency (use before diagnosis confirmed) Goals: Patient will maintain optimal edema control Date Initiated: 03/25/2020 Target Resolution Date: 04/22/2020 Goal Status: Active Interventions: Assess peripheral edema status every visit. Compression as ordered Treatment Activities: Therapeutic compression applied : 03/25/2020 Notes: Wound/Skin  Impairment Nursing Diagnoses: Impaired tissue integrity Knowledge deficit related to smoking impact on wound healing Knowledge deficit related to ulceration/compromised skin integrity Goals: Patient/caregiver will verbalize understanding of skin care regimen Date Initiated: 03/25/2020 Target Resolution Date: 04/22/2020 Goal Status: Active Ulcer/skin breakdown will have a volume reduction of 30% by week 4 Date Initiated: 03/25/2020 Target Resolution Date: 04/22/2020 Goal Status: Active Interventions: Assess patient/caregiver ability to obtain necessary supplies Assess patient/caregiver ability to perform ulcer/skin care regimen upon admission and as needed Assess ulceration(s) every visit Provide education on ulcer and skin care Treatment Activities: Skin care regimen initiated : 03/25/2020 Topical wound management initiated : 03/25/2020 Notes: Electronic Signature(s) Signed: 03/25/2020 5:10:26 PM By: Zenaida Deed RN, BSN Entered By: Zenaida Deed on 03/25/2020 09:42:30 -------------------------------------------------------------------------------- Pain Assessment Details Patient Name: Date of Service: [place], [place] 03/25/2020 9:00 A M Medical Record Number: [id number] Patient Account Number: [id number] Date of Birth/Sex: Treating RN: 12-04-1965 (55 y.o. Elizebeth Koller Primary Care Jannifer Fischler: [name] Other Clinician: Referring Jemina Scahill: Treating Anuel Sitter/Extender: [name] in Treatment: 0 Active Problems Location of Pain Severity and Description of Pain Patient Has Paino Yes Site Locations Pain Location: Pain in Ulcers With Dressing Change: Yes Duration of the Pain. Constant / Intermittento Intermittent Rate the pain. Current Pain Level: 5 Worst Pain Level: 8 Least Pain Level: 0 Character of Pain Describe the Pain: Stabbing, Throbbing Pain Management and Medication Current Pain Management: Medication: Yes Cold Application: No Rest:  No Massage: No Activity: No T.E.N.S.: No Heat Application: No Leg drop or elevation: No Is the Current Pain Management Adequate: Adequate How does your wound impact your activities of daily livingo Sleep: No Bathing: No Appetite: No Relationship With Others: No Bladder Continence: No Emotions: No Bowel Continence: No Work: No Toileting: No Drive: No Dressing: No Hobbies: No Psychologist, prison and probation services) Signed: 03/25/2020 5:02:12 PM By: Zandra Abts RN, BSN Entered By: Zandra Abts on 03/25/2020 09:20:49 -------------------------------------------------------------------------------- Patient/Caregiver Education Details Patient Name: Date of Service: [place], [place] 2/9/2022andnbsp9:00 A M Medical Record Number: [id number] Patient Account Number: [id number] Date of Birth/Gender: Treating RN: 1965-08-10 (55 y.o. [name] Primary Care Physician: [name] Other Clinician: Referring Physician: Treating Physician/Extender: [name] in Treatment: 0 Education Assessment Education Provided To: Patient Education Topics Provided Elevated Blood Sugar/ Impact on Healing: Handouts: Elevated Blood Sugars: How Do They Affect Wound Healing Methods: Explain/Verbal, Printed Responses: Reinforcements needed, State content correctly Venous: Handouts: Controlling Swelling with Multilayered Compression Wraps Methods: Explain/Verbal, Printed Responses: Reinforcements needed, State content correctly [facility]: o Handouts: Welcome [facility] o Methods: Explain/Verbal, Printed Responses: Reinforcements needed, State content correctly Wound/Skin Impairment: Electronic Signature(s) Signed: 03/25/2020 5:10:26 PM By: Zenaida Deed RN, BSN Entered By: Zenaida Deed on 03/25/2020 09:43:41 -------------------------------------------------------------------------------- Wound Assessment Details Patient Name: Date  of Service: [place], [place] 03/25/2020 9:00 A M Medical Record Number: [id number] Patient Account Number: [id number]  Date of Birth/Sex: Treating RN: [birth year] (55 y.o. Elizebeth KollerM) Lynch, Shatara Primary Care Jihad Brownlow: Azzie RoupAnderson, Shane Other Clinician: Referring Virlee Stroschein: Treating Breahna Boylen/Extender: Concha PyoStone III, Hoyt Anderson, Shane Weeks in Treatment: 0 Wound Status Wound Number: 1 Primary Etiology: Venous Leg Ulcer Wound Location: Left, Proximal, Anterior Lower Leg Wound Status: Open Wounding Event: Trauma Comorbid History: Sleep Apnea, Hypertension, Type II Diabetes, Gout Date Acquired: 01/26/2020 Weeks Of Treatment: 0 Clustered Wound: No Wound Measurements Length: (cm) 1.5 Width: (cm) 1.7 Depth: (cm) 0.1 Area: (cm) 2.003 Volume: (cm) 0.2 % Reduction in Area: % Reduction in Volume: Epithelialization: None Tunneling: No Undermining: No Wound Description Classification: Full Thickness Without Exposed Support Structures Wound Margin: Flat and Intact Exudate Amount: Medium Exudate Type: Serosanguineous Exudate Color: red, brown Foul Odor After Cleansing: No Slough/Fibrino Yes Wound Bed Granulation Amount: Medium (34-66%) Exposed Structure Granulation Quality: Pink Fascia Exposed: No Necrotic Amount: Medium (34-66%) Fat Layer (Subcutaneous Tissue) Exposed: Yes Necrotic Quality: Adherent Slough Tendon Exposed: No Muscle Exposed: No Joint Exposed: No Bone Exposed: No Treatment Notes Wound #1 (Lower Leg) Wound Laterality: Left, Anterior, Proximal Cleanser Peri-Wound Care Sween Lotion (Moisturizing lotion) Discharge Instruction: Apply moisturizing lotion as directed Topical Primary Dressing IODOFLEX 0.9% Cadexomer Iodine Pad 4x6 cm Discharge Instruction: Apply to wound bed as instructed Secondary Dressing Woven Gauze Sponge, Non-Sterile 4x4 in Discharge Instruction: Apply over primary dressing as directed. Secured With Compression Wrap FourPress (4 layer compression  wrap) Discharge Instruction: Apply four layer compression as directed. Compression Stockings Add-Ons Electronic Signature(s) Signed: 03/25/2020 5:02:12 PM By: Zandra AbtsLynch, Shatara RN, BSN Entered By: Zandra AbtsLynch, Shatara on 03/25/2020 09:17:14 -------------------------------------------------------------------------------- Wound Assessment Details Patient Name: Date of Service: [place] 03/25/2020 9:00 A M Medical Record Number: [id number] Patient Account Number: [id number] Date of Birth/Sex: Treating RN: [birth year] (55 y.o. Elizebeth KollerM) Lynch, Shatara Primary Care Garnett Rekowski: Azzie RoupAnderson, Shane Other Clinician: Referring Tjay Velazquez: Treating Virgilia Quigg/Extender: Concha PyoStone III, Hoyt Anderson, Shane Weeks in Treatment: 0 Wound Status Wound Number: 2 Primary Etiology: Venous Leg Ulcer Wound Location: Left, Distal, Anterior Lower Leg Wound Status: Open Wounding Event: Trauma Comorbid History: Sleep Apnea, Hypertension, Type II Diabetes, Gout Date Acquired: 01/26/2020 Weeks Of Treatment: 0 Clustered Wound: No Wound Measurements Length: (cm) 1 Width: (cm) 0.6 Depth: (cm) 0.1 Area: (cm) 0.471 Volume: (cm) 0.047 % Reduction in Area: % Reduction in Volume: Epithelialization: None Tunneling: No Undermining: No Wound Description Classification: Full Thickness Without Exposed Support Str Wound Margin: Flat and Intact Exudate Amount: Medium Exudate Type: Serosanguineous Exudate Color: red, brown uctures Foul Odor After Cleansing: No Slough/Fibrino Yes Wound Bed Granulation Amount: Medium (34-66%) Exposed Structure Granulation Quality: Pink Fascia Exposed: No Necrotic Amount: Medium (34-66%) Fat Layer (Subcutaneous Tissue) Exposed: Yes Necrotic Quality: Adherent Slough Tendon Exposed: No Muscle Exposed: No Joint Exposed: No Bone Exposed: No Treatment Notes Wound #2 (Lower Leg) Wound Laterality: Left, Anterior, Distal Cleanser Peri-Wound Care Sween Lotion (Moisturizing lotion) Discharge  Instruction: Apply moisturizing lotion as directed Topical Primary Dressing IODOFLEX 0.9% Cadexomer Iodine Pad 4x6 cm Discharge Instruction: Apply to wound bed as instructed Secondary Dressing Woven Gauze Sponge, Non-Sterile 4x4 in Discharge Instruction: Apply over primary dressing as directed. Secured With Compression Wrap FourPress (4 layer compression wrap) Discharge Instruction: Apply four layer compression as directed. Compression Stockings Add-Ons Electronic Signature(s) Signed: 03/25/2020 5:02:12 PM By: Zandra AbtsLynch, Shatara RN, BSN Entered By: Zandra AbtsLynch, Shatara on 03/25/2020 09:18:46 -------------------------------------------------------------------------------- Vitals Details Patient Name: Date of Service: [place] 03/25/2020 9:00 A M Medical Record Number: [id number] Patient Account Number: [id number] Date of Birth/Sex: Treating  RN: 1965-05-11 (55 y.o. Elizebeth Koller Primary Care Symon Norwood: [name] Other Clinician: Referring Zelie Asbill: Treating Ryla Cauthon/Extender: [name] in Treatment: 0 Vital Signs Time Taken: 08:58 Temperature (F): 99.4 Height (in): 72 Pulse (bpm): 102 Source: Stated Respiratory Rate (breaths/min): 18 Weight (lbs): 360 Blood Pressure (mmHg): 195/115 Source: Stated Reference Range: 80 - 120 mg / dl Body Mass Index (BMI): 48.8 Electronic Signature(s) Signed: 03/25/2020 5:02:12 PM By: Zandra Abts RN, BSN Entered By: Zandra Abts on 03/25/2020 10:12:45

## 2020-03-25 NOTE — Progress Notes (Signed)
[name] [name], [name] [name] ([id number]) Visit Report for 03/25/2020 Abuse/Suicide Risk Screen Details Patient Name: Date of Service: [name], [place] 03/25/2020 9:00 A M Medical Record Number: [id number] Patient Account Number: [id number] Date of Birth/Sex: Treating RN: May 12, 1965 (55 y.o. [name] [name] Primary Care [name] [name]: [name] Other Clinician: Referring [name] [name]: Treating [name] [name]/Extender: [name] in Treatment: 0 Abuse/Suicide Risk Screen Items Answer ABUSE RISK SCREEN: Has anyone close to you tried to hurt or harm you recentlyo No Do you feel uncomfortable with anyone in your familyo No Has anyone forced you do things that you didnt want to doo No Electronic Signature(s) Signed: 03/25/2020 5:02:12 PM By: Zandra Abts RN, BSN Entered By: Zandra Abts on 03/25/2020 09:12:41 -------------------------------------------------------------------------------- Activities of Daily Living Details Patient Name: Date of Service: [place] 03/25/2020 9:00 A M Medical Record Number: [id number] Patient Account Number: [id number] Date of Birth/Sex: Treating RN: 1965-09-01 (55 y.o. [name] [name] Primary Care [name] [name]: [name] Other Clinician: Referring [name] [name]: Treating [name] [name]/Extender: [name] in Treatment: 0 Activities of Daily Living Items Answer Activities of Daily Living (Please select one for each item) Drive Automobile Completely Able T Medications ake Completely Able Use T elephone Completely Able Care for Appearance Completely Able Use T oilet Completely Able Bath / Shower Completely Able Dress Self Completely Able Feed Self Completely Able Walk Completely Able Get In / Out Bed Completely Able Housework Completely Able Prepare Meals Completely Able Handle Money Completely Able Shop for Self Completely Able Electronic Signature(s) Signed: 03/25/2020 5:02:12 PM By: Zandra Abts RN, BSN Entered By:  Zandra Abts on 03/25/2020 09:13:09 -------------------------------------------------------------------------------- Education Screening Details Patient Name: Date of Service: [place] 03/25/2020 9:00 A M Medical Record Number: [id number] Patient Account Number: [id number] Date of Birth/Sex: Treating RN: 1965/08/17 (55 y.o. [name] [name] Primary Care [name] [name]: [name] Other Clinician: Referring [name] [name]: Treating Darly Massi/Extender: [name] in Treatment: 0 Primary Learner Assessed: Patient Learning Preferences/Education Level/Primary Language Learning Preference: Explanation, Demonstration, Printed Material Highest Education Level: High School Preferred Language: English Cognitive Barrier Language Barrier: No Translator Needed: No Memory Deficit: No Emotional Barrier: No Cultural/Religious Beliefs Affecting Medical Care: No Physical Barrier Impaired Vision: No Impaired Hearing: No Decreased Hand dexterity: No Knowledge/Comprehension Knowledge Level: High Comprehension Level: High Ability to understand written instructions: High Ability to understand verbal instructions: High Motivation Anxiety Level: Calm Cooperation: Cooperative Education Importance: Acknowledges Need Interest in Health Problems: Asks Questions Perception: Coherent Willingness to Engage in Self-Management High Activities: Readiness to Engage in Self-Management High Activities: Electronic Signature(s) Signed: 03/25/2020 5:02:12 PM By: Zandra Abts RN, BSN Entered By: Zandra Abts on 03/25/2020 09:13:31 -------------------------------------------------------------------------------- Fall Risk Assessment Details Patient Name: Date of Service: [place] 03/25/2020 9:00 A M Medical Record Number: [id number] Patient Account Number: [id number] Date of Birth/Sex: Treating RN: [birth year] (54 y.o. [name] [name] Primary Care [name] [name]: [name] Other Clinician: Referring [name] [name]: Treating [name] [name]/Extender: [name] in Treatment: 0 Fall Risk Assessment Items Have you had 2 or more falls in the last 12 monthso 0 No Have you had any fall that resulted in injury in the last 12 monthso 0 No FALLS RISK SCREEN History of falling - immediate or within 3 months 0 No Secondary diagnosis (Do you have 2 or more medical diagnoseso) 15 Yes Ambulatory aid None/bed rest/wheelchair/nurse 0 Yes Crutches/cane/walker 0 No Furniture 0 No Intravenous therapy Access/Saline/Heparin Lock 0 No Gait/Transferring Normal/ bed rest/  wheelchair 0 Yes Weak (short steps with or without shuffle, stooped but able to lift head while walking, may seek 0 No support from furniture) Impaired (short steps with shuffle, may have difficulty arising from chair, head down, impaired 0 No balance) Mental Status Oriented to own ability 0 Yes Electronic Signature(s) Signed: 03/25/2020 5:02:12 PM By: Zandra Abts RN, BSN Entered By: Zandra Abts on 03/25/2020 09:13:41 -------------------------------------------------------------------------------- Foot Assessment Details Patient Name: Date of Service: [place] 03/25/2020 9:00 A M Medical Record Number: [id number] Patient Account Number: [id number] Date of Birth/Sex: Treating RN: October 14, 1965 (55 y.o. [name] [name] Primary Care [name] [name]: [name] Other Clinician: Referring [name] [name]: Treating [name] [name]/Extender: [name] in Treatment: 0 Foot Assessment Items Site Locations + = Sensation present, - = Sensation absent, C = Callus, U = Ulcer R = Redness, W = Warmth, M = Maceration, PU = Pre-ulcerative lesion F = Fissure, S = Swelling, D = Dryness Assessment Right: Left: Other Deformity: No No Prior Foot Ulcer: No No Prior Amputation: No No Charcot Joint: No No Ambulatory Status: Ambulatory Without Help Gait: Steady Electronic  Signature(s) Signed: 03/25/2020 5:02:12 PM By: Zandra Abts RN, BSN Entered By: Zandra Abts on 03/25/2020 09:21:55 -------------------------------------------------------------------------------- Nutrition Risk Screening Details Patient Name: Date of Service: [place] 03/25/2020 9:00 A M Medical Record Number: [id number] Patient Account Number: [id number] Date of Birth/Sex: Treating RN: 1965-04-10 (55 y.o. [name] [name] Primary Care Malekai Markwood: [name] Other Clinician: Referring Laketra Bowdish: Treating Naydene Kamrowski/Extender: [name] in Treatment: 0 Height (in): 72 Weight (lbs): 360 Body Mass Index (BMI): 48.8 Nutrition Risk Screening Items Score Screening NUTRITION RISK SCREEN: I have an illness or condition that made me change the kind and/or amount of food I eat 2 Yes I eat fewer than two meals per day 0 No I eat few fruits and vegetables, or milk products 0 No I have three or more drinks of beer, liquor or wine almost every day 0 No I have tooth or mouth problems that make it hard for me to eat 0 No I don't always have enough money to buy the food I need 0 No I eat alone most of the time 0 No I take three or more different prescribed or over-the-counter drugs a day 1 Yes Without wanting to, I have lost or gained 10 pounds in the last six months 0 No I am not always physically able to shop, cook and/or feed myself 0 No Nutrition Protocols Good Risk Protocol Moderate Risk Protocol 0 Provide education on nutrition High Risk Proctocol Risk Level: Moderate Risk Score: 3 Electronic Signature(s) Signed: 03/25/2020 5:02:12 PM By: Zandra Abts RN, BSN Entered By: Zandra Abts on 03/25/2020 09:13:58

## 2020-04-03 ENCOUNTER — Other Ambulatory Visit: Payer: Self-pay

## 2020-04-03 ENCOUNTER — Encounter (HOSPITAL_BASED_OUTPATIENT_CLINIC_OR_DEPARTMENT_OTHER): Payer: BC Managed Care – PPO | Admitting: Internal Medicine

## 2020-04-03 DIAGNOSIS — E11622 Type 2 diabetes mellitus with other skin ulcer: Secondary | ICD-10-CM | POA: Diagnosis not present

## 2020-04-03 NOTE — Progress Notes (Signed)
[name] [name], [name] [name] ([id number]) Visit Report for 04/03/2020 Arrival Information Details Patient Name: [place], [place] [facility]03/2020 3:30 PM Medical Record Number: [id number] Patient Account Number: [id number] Date of Birth/Sex: Treating RN: 1965/08/18 (55 y.o. [name] SoursM) Deaton, Bobbi Primary Care Shalese Strahan: Azzie RoupAnderson, Shane Other Clinician: Referring Keiona Jenison: Treating Siah Kannan/Extender: Tenny Crawobson, Michael Anderson, Shane Weeks in Treatment: 1 Visit Information History Since Last Visit Added or deleted any medications: No Patient Arrived: Ambulatory Any new allergies or adverse reactions: No Arrival Time: 15:38 Had a fall or experienced change in No Accompanied By: self activities of daily living that may affect Transfer Assistance: None risk of falls: Patient Identification Verified: Yes Signs or symptoms of abuse/neglect since last visito No Secondary Verification Process Completed: Yes Hospitalized since last visit: No Patient Requires Transmission-Based Precautions: No Implantable device outside of the clinic excluding No Patient Has Alerts: Yes cellular tissue based products placed in the center Patient Alerts: L ABI non compressible since last visit: Has Dressing in Place as Prescribed: Yes Has Compression in Place as Prescribed: Yes Pain Present Now: Yes Electronic Signature(s) Signed: 04/03/2020 5:04:58 PM By: Shawn Stalleaton, Bobbi Entered By: Shawn Stalleaton, Bobbi on 04/03/2020 15:38:32 -------------------------------------------------------------------------------- Compression Therapy Details Patient Name: [place] 04/03/2020 3:30 PM Medical Record Number: [id number] Patient Account Number: [id number] Date of Birth/Sex: Treating RN: 1965/08/18 (55 y.o. [name] SchoonerM) Boehlein, Linda Primary Care Macy Lingenfelter: Azzie RoupAnderson, Shane Other Clinician: Referring Mcgwire Dasaro: Treating Caliph Borowiak/Extender: Tenny Crawobson, Michael Anderson, Shane Weeks in Treatment: 1 Compression Therapy Performed for Wound  Assessment: Wound #1 Left,Proximal,Anterior Lower Leg Performed By: Clinician Shawn Stalleaton, Bobbi, RN Compression Type: Double Layer Post Procedure Diagnosis Same as Pre-procedure Electronic Signature(s) Signed: 04/03/2020 5:33:43 PM By: Zenaida DeedBoehlein, Linda RN, BSN Entered By: Zenaida DeedBoehlein, Linda on 04/03/2020 16:03:54 -------------------------------------------------------------------------------- Encounter Discharge Information Details Patient Name: [place] of [place] 04/03/2020 3:30 PM Medical Record Number: [id number] Patient Account Number: [id number] Date of Birth/Sex: Treating RN: 1965/08/18 (55 y.o. [name] SoursM) Deaton, Bobbi Primary Care [name]: Azzie RoupAnderson, Shane Other Clinician: Referring Adalid Beckmann: Treating Quenton Recendez/Extender: Tenny Crawobson, Michael Anderson, Shane Weeks in Treatment: 1 Encounter Discharge Information Items Discharge Condition: Stable Ambulatory Status: Ambulatory Discharge Destination: Home Transportation: Private Auto Accompanied By: self Schedule Follow-up Appointment: Yes Clinical Summary of Care: Electronic Signature(s) Signed: 04/03/2020 5:04:58 PM By: Shawn Stalleaton, Bobbi Entered By: Shawn Stalleaton, Bobbi on 04/03/2020 16:16:53 -------------------------------------------------------------------------------- Lower Extremity Assessment Details Patient Name: [place] 04/03/2020 3:30 PM Medical Record Number: [id number] Patient Account Number: [id number] Date of Birth/Sex: Treating RN: 1965/08/18 (55 y.o. [name] SoursM) Deaton, Bobbi Primary Care Jerad Dunlap: Azzie RoupAnderson, Shane Other Clinician: Referring Olga Bourbeau: Treating Jenissa Tyrell/Extender: Tenny Crawobson, Michael Anderson, Shane Weeks in Treatment: 1 Edema Assessment Assessed: Kyra Searles[Left: Yes] Franne Forts[Right: No] Edema: [Left: Ye] [Right: s] Calf Left: Right: Point of Measurement: 40 cm From Medial Instep 49.5 cm Ankle Left: Right: Point of Measurement: 10 cm From Medial Instep 30.2 cm Vascular Assessment Pulses: Dorsalis  Pedis Palpable: [Left:Yes] Electronic Signature(s) Signed: 04/03/2020 5:04:58 PM By: Shawn Stalleaton, Bobbi Entered By: Shawn Stalleaton, Bobbi on 04/03/2020 15:48:04 -------------------------------------------------------------------------------- Multi Wound Chart Details Patient Name: [place] 04/03/2020 3:30 PM Medical Record Number: [id number] Patient Account Number: [id number] Date of Birth/Sex: Treating RN: 1965/08/18 (55 y.o. [name] SchoonerM) Boehlein, Linda Primary Care [name] [name]: Azzie RoupAnderson, Shane Other Clinician: Referring Dave Mannes: Treating Calistro Rauf/Extender: Tenny Crawobson, Michael Anderson, Shane Weeks in Treatment: 1 Vital Signs Height(in): 72 Pulse(bpm): 94 Weight(lbs): 360 Blood Pressure(mmHg): 185/113 Body Mass Index(BMI): 49 Temperature(F): 98.5 Respiratory Rate(breaths/min): 20 Photos: [1:No Photos Left, Proximal, Anterior Lower Leg] [2:No Photos  Left, Distal, Anterior Lower Leg] [N/A:N/A N/A] Wound Location: [1:Trauma] [2:Trauma] [N/A:N/A] Wounding Event: [1:Venous Leg Ulcer] [2:Venous Leg Ulcer] [N/A:N/A] Primary Etiology: [1:Sleep Apnea, Hypertension, Type II Sleep Apnea, Hypertension, Type II N/A] Comorbid History: [1:Diabetes, Gout 01/26/2020] [2:Diabetes, Gout 01/26/2020] [N/A:N/A] Date Acquired: [1:1] [2:1] [N/A:N/A] Weeks of Treatment: [1:Open] [2:Open] [N/A:N/A] Wound Status: [1:1.7x2.1x0.2] [2:0.1x0.1x0.1] [N/A:N/A] Measurements L x W x D (cm) [1:2.804] [2:0.008] [N/A:N/A] A (cm) : rea [1:0.561] [2:0.001] [N/A:N/A] Volume (cm) : [1:-40.00%] [2:98.30%] [N/A:N/A] % Reduction in Area: [1:-180.50%] [2:97.90%] [N/A:N/A] % Reduction in Volume: [1:Full Thickness Without Exposed] [2:Full Thickness Without Exposed] [N/A:N/A] Classification: [1:Support Structures Medium] [2:Support Structures Medium] [N/A:N/A] Exudate A mount: [1:Serosanguineous] [2:Serosanguineous] [N/A:N/A] Exudate Type: [1:red, brown] [2:red, brown] [N/A:N/A] Exudate Color: [1:Flat and Intact] [2:Flat and  Intact] [N/A:N/A] Wound Margin: [1:Large (67-100%)] [2:Large (67-100%)] [N/A:N/A] Granulation Amount: [1:Pink] [2:Pink] [N/A:N/A] Granulation Quality: [1:Small (1-33%)] [2:None Present (0%)] [N/A:N/A] Necrotic Amount: [1:Eschar, Adherent Slough] [2:N/A] [N/A:N/A] Necrotic Tissue: [1:Fat Layer (Subcutaneous Tissue): Yes Fat Layer (Subcutaneous Tissue): Yes N/A] Exposed Structures: [1:Fascia: No Tendon: No Muscle: No Joint: No Bone: No None] [2:Fascia: No Tendon: No Muscle: No Joint: No Bone: No Large (67-100%)] [N/A:N/A] Epithelialization: [1:Compression Therapy] [2:N/A] [N/A:N/A] Treatment Notes Electronic Signature(s) Signed: 04/03/2020 5:25:02 PM By: Baltazar Najjar MD Signed: 04/03/2020 5:33:43 PM By: Zenaida Deed RN, BSN Entered By: Baltazar Najjar on 04/03/2020 16:10:00 -------------------------------------------------------------------------------- Multi-Disciplinary Care Plan Details Patient Name: Date of Service: [place], [place] 04/03/2020 3:30 PM Medical Record Number: [id number] Patient Account Number: [id number] Date of Birth/Sex: Treating RN: 12-28-1965 (55 y.o. [name] [name] Primary Care Francely Craw: [name] Other Clinician: Referring Siddhant Hashemi: Treating Devonte Migues/Extender: Tenny Craw in Treatment: 1 Active Inactive Nutrition Nursing Diagnoses: Impaired glucose control: actual or potential Potential for alteratiion in Nutrition/Potential for imbalanced nutrition Goals: Patient/caregiver will maintain therapeutic glucose control Date Initiated: 03/25/2020 Target Resolution Date: 04/22/2020 Goal Status: Active Interventions: Assess HgA1c results as ordered upon admission and as needed Assess patient nutrition upon admission and as needed per policy Provide education on elevated blood sugars and impact on wound healing Treatment Activities: Patient referred to Primary Care Physician for further nutritional evaluation :  03/25/2020 Notes: Venous Leg Ulcer Nursing Diagnoses: Knowledge deficit related to disease process and management Potential for venous Insuffiency (use before diagnosis confirmed) Goals: Patient will maintain optimal edema control Date Initiated: 03/25/2020 Target Resolution Date: 04/22/2020 Goal Status: Active Interventions: Assess peripheral edema status every visit. Compression as ordered Treatment Activities: Therapeutic compression applied : 03/25/2020 Notes: Wound/Skin Impairment Nursing Diagnoses: Impaired tissue integrity Knowledge deficit related to smoking impact on wound healing Knowledge deficit related to ulceration/compromised skin integrity Goals: Patient/caregiver will verbalize understanding of skin care regimen Date Initiated: 03/25/2020 Target Resolution Date: 04/22/2020 Goal Status: Active Ulcer/skin breakdown will have a volume reduction of 30% by week 4 Date Initiated: 03/25/2020 Target Resolution Date: 04/22/2020 Goal Status: Active Interventions: Assess patient/caregiver ability to obtain necessary supplies Assess patient/caregiver ability to perform ulcer/skin care regimen upon admission and as needed Assess ulceration(s) every visit Provide education on ulcer and skin care Treatment Activities: Skin care regimen initiated : 03/25/2020 Topical wound management initiated : 03/25/2020 Notes: Electronic Signature(s) Signed: 04/03/2020 5:33:43 PM By: Zenaida Deed RN, BSN Entered By: Zenaida Deed on 04/03/2020 16:01:45 -------------------------------------------------------------------------------- Pain Assessment Details Patient Name: Date of Service: [place], [place] 04/03/2020 3:30 PM Medical Record Number: [id number] Patient Account Number: [id number] Date of Birth/Sex: Treating RN: [birth year] (55 y.o. [name] [name] Primary Care Asheton Viramontes: [name] Other Clinician: Referring Naraya Stoneberg:  Treating Kynadee Dam/Extender: Tenny Craw in Treatment: 1 Active Problems Location of Pain Severity and Description of Pain Patient Has Paino Yes Site Locations Pain Location: Pain in Ulcers Rate the pain. Current Pain Level: 7 Worst Pain Level: 10 Least Pain Level: 0 Tolerable Pain Level: 8 Character of Pain Describe the Pain: Heavy, Sharp Pain Management and Medication Current Pain Management: Medication: No Cold Application: No Rest: No Massage: No Activity: No T.E.N.S.: No Heat Application: No Leg drop or elevation: No Is the Current Pain Management Adequate: Adequate How does your wound impact your activities of daily livingo Sleep: No Bathing: No Appetite: No Relationship With Others: No Bladder Continence: No Emotions: No Bowel Continence: No Work: No Toileting: No Drive: No Dressing: No Hobbies: No Electronic Signature(s) Signed: 04/03/2020 5:04:58 PM By: Shawn Stall Entered By: Shawn Stall on 04/03/2020 15:47:36 -------------------------------------------------------------------------------- Patient/Caregiver Education Details Patient Name: Date of Service: Perlie Mayo 2/18/2022andnbsp3:30 PM Medical Record Number: [id number] Patient Account Number: [id number] Date of Birth/Gender: Treating RN: [birth year] (55 y.o. [name] [name] Primary Care Physician: [name] Other Clinician: Referring Physician: Treating Physician/Extender: Tenny Craw in Treatment: 1 Education Assessment Education Provided To: Patient Education Topics Provided Elevated Blood Sugar/ Impact on Healing: Methods: Explain/Verbal Responses: Reinforcements needed, State content correctly Venous: Methods: Explain/Verbal Responses: Reinforcements needed, State content correctly Wound/Skin Impairment: Methods: Explain/Verbal Responses: Reinforcements needed, State content correctly Electronic Signature(s) Signed: 04/03/2020 5:33:43 PM By: Zenaida Deed RN,  BSN Entered By: Zenaida Deed on 04/03/2020 16:02:14 -------------------------------------------------------------------------------- Wound Assessment Details Patient Name: Date of Service: [place], [place] 04/03/2020 3:30 PM Medical Record Number: [id number] Patient Account Number: [id number] Date of Birth/Sex: Treating RN: 1965/04/21 (55 y.o. Harlon Flor, Yvonne Kendall Primary Care Candyce Gambino: [name] Other Clinician: Referring Reanna Scoggin: Treating Mays Paino/Extender: Tenny Craw in Treatment: 1 Wound Status Wound Number: 1 Primary Etiology: Venous Leg Ulcer Wound Location: Left, Proximal, Anterior Lower Leg Wound Status: Open Wounding Event: Trauma Comorbid History: Sleep Apnea, Hypertension, Type II Diabetes, Gout Date Acquired: 01/26/2020 Weeks Of Treatment: 1 Clustered Wound: No Wound Measurements Length: (cm) 1.7 Width: (cm) 2.1 Depth: (cm) 0.2 Area: (cm) 2.804 Volume: (cm) 0.561 % Reduction in Area: -40% % Reduction in Volume: -180.5% Epithelialization: None Tunneling: No Undermining: No Wound Description Classification: Full Thickness Without Exposed Support Structures Wound Margin: Flat and Intact Exudate Amount: Medium Exudate Type: Serosanguineous Exudate Color: red, brown Foul Odor After Cleansing: No Slough/Fibrino Yes Wound Bed Granulation Amount: Large (67-100%) Exposed Structure Granulation Quality: Pink Fascia Exposed: No Necrotic Amount: Small (1-33%) Fat Layer (Subcutaneous Tissue) Exposed: Yes Necrotic Quality: Eschar, Adherent Slough Tendon Exposed: No Muscle Exposed: No Joint Exposed: No Bone Exposed: No Treatment Notes Wound #1 (Lower Leg) Wound Laterality: Left, Anterior, Proximal Cleanser Peri-Wound Care Sween Lotion (Moisturizing lotion) Discharge Instruction: Apply moisturizing lotion as directed Topical Primary Dressing Hydrofera Blue Classic Foam, 2x2 in Discharge Instruction: Moisten with saline prior to  applying to wound bed Secondary Dressing Woven Gauze Sponge, Non-Sterile 4x4 in Discharge Instruction: Apply over primary dressing as directed. Secured With Compression Wrap 2 press compression wrap Compression Stockings Add-Ons Electronic Signature(s) Signed: 04/03/2020 5:04:58 PM By: Shawn Stall Entered By: Shawn Stall on 04/03/2020 15:51:22 -------------------------------------------------------------------------------- Wound Assessment Details Patient Name: Date of Service: [place] 04/03/2020 3:30 PM Medical Record Number: [id number] Patient Account Number: [id number] Date of Birth/Sex: Treating RN: 1965/02/22 (55 y.o. [name] [name] Primary Care Karder Goodin: [name] Other Clinician: Referring Edan Serratore: Treating Ledger Heindl/Extender: Baltazar Najjar  [name] Weeks in Treatment: 1 Wound Status Wound Number: 2 Primary Etiology: Venous Leg Ulcer Wound Location: Left, Distal, Anterior Lower Leg Wound Status: Open Wounding Event: Trauma Comorbid History: Sleep Apnea, Hypertension, Type II Diabetes, Gout Date Acquired: 01/26/2020 Weeks Of Treatment: 1 Clustered Wound: No Wound Measurements Length: (cm) 0.1 Width: (cm) 0.1 Depth: (cm) 0.1 Area: (cm) 0.008 Volume: (cm) 0.001 % Reduction in Area: 98.3% % Reduction in Volume: 97.9% Epithelialization: Large (67-100%) Tunneling: No Undermining: No Wound Description Classification: Full Thickness Without Exposed Support Structures Wound Margin: Flat and Intact Exudate Amount: Medium Exudate Type: Serosanguineous Exudate Color: red, brown Foul Odor After Cleansing: No Slough/Fibrino Yes Wound Bed Granulation Amount: Large (67-100%) Exposed Structure Granulation Quality: Pink Fascia Exposed: No Necrotic Amount: None Present (0%) Fat Layer (Subcutaneous Tissue) Exposed: Yes Tendon Exposed: No Muscle Exposed: No Joint Exposed: No Bone Exposed: No Treatment Notes Wound #2 (Lower Leg) Wound  Laterality: Left, Anterior, Distal Cleanser Peri-Wound Care Sween Lotion (Moisturizing lotion) Discharge Instruction: Apply moisturizing lotion as directed Topical Primary Dressing Hydrofera Blue Classic Foam, 2x2 in Discharge Instruction: Moisten with saline prior to applying to wound bed Secondary Dressing Woven Gauze Sponge, Non-Sterile 4x4 in Discharge Instruction: Apply over primary dressing as directed. Secured With Compression Wrap 2 press compression wrap Compression Stockings Add-Ons Electronic Signature(s) Signed: 04/03/2020 5:04:58 PM By: Shawn Stall Entered By: Shawn Stall on 04/03/2020 15:49:41 -------------------------------------------------------------------------------- Vitals Details Patient Name: Date of Service: [place], [place] 04/03/2020 3:30 PM Medical Record Number: [id number] Patient Account Number: [id number] Date of Birth/Sex: Treating RN: 1966-01-06 (55 y.o. [name] [name] Primary Care Kekoa Fyock: [name] Other Clinician: Referring Aunya Lemler: Treating Zyaira Vejar/Extender: Tenny Craw in Treatment: 1 Vital Signs Time Taken: 15:38 Temperature (F): 98.5 Height (in): 72 Pulse (bpm): 94 Weight (lbs): 360 Respiratory Rate (breaths/min): 20 Body Mass Index (BMI): 48.8 Blood Pressure (mmHg): 185/113 Reference Range: 80 - 120 mg / dl Notes MD and case manager made aware of BP. Recheck once intake complete and increased 195/122. Per patient seen cardiologist Monday and had added additional BP med, unfortunately patient does not know what BP med it is. Electronic Signature(s) Signed: 04/03/2020 5:04:58 PM By: Shawn Stall Entered By: Shawn Stall on 04/03/2020 16:16:05

## 2020-04-03 NOTE — Progress Notes (Signed)
[name] [name], [name] [name] ([id number]) Visit Report for 04/03/2020 HPI Details Patient Name: Date of Service: [place], [place] 04/03/2020 3:30 PM Medical Record Number: [id number] Patient Account Number: [id number] Date of Birth/Sex: Treating RN: June 11, 1965 (55 y.o. [name] [name] Primary Care Provider: [name] Other Clinician: Referring Provider: Treating Provider/Extender: Tenny Craw in Treatment: 1 History of Present Illness HPI Description: 03/25/2020 upon evaluation today patient appears to be doing somewhat poorly in regard to his left lower extremity on the anterior portion. He has 2 wounds here that are actually giving him quite a bit of pain. They occurred due to an injury on December 12 when he struck his anterior portion of the leg with a window. He was carrying this at the time. With that being said it was a little cut he thought it would heal he put a little bit antibiotic ointment was using peroxide. With that being said he tells me that on December 19 he actually went [facility] because it did not seem to be healing appropriately they give him Keflex told him to quit using the peroxide and it he was hoping that would be the end of it. Unfortunately 2 weeks later on January 5 he ended up in the ER at [facility] where they gave him doxycycline and recommended that he come here. He does have a history of diabetes mellitus type 2 his most recent hemoglobin A1c on October 2021 was 6.7. With that being said he tells me he does not take any medications for this currently. He does also have a history of sleep apnea though his ischium is broken at the moment he does know he needs to call to get that fixed. He also has hypertension which has been higher due to the fact that his sleep apnea is not controlled he tells me. With regard to his wound I think the biggest issue we see here is really that he has significant swelling due to venous stasis that likely is limiting his  ability to be able to heal appropriately. I think that we may need to see what we can do to improve his edema status. 2/18; this was a patient who was admitted that the clinic last week. He has a area on the left anterior lower leg x2 when he arrived here. Changes of chronic venous insufficiency. We used Iodoflex under a 2 layer compression system. He arrives back in clinic complaining of a lot of pain, pain with elevation of his leg. 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 He arrives in clinic with the more distal wound closed over although there is some callus in this area. The more proximal wound is larger still with some debris on the center. Because of his aggression of the complaints of pain I did not debride this. Change the primary dressing to [facility] question irritation from [facility]) Signed: 04/03/2020 5:25:02 PM By: Baltazar Najjar MD Entered By: Baltazar Najjar on 04/03/2020 16:11:52 -------------------------------------------------------------------------------- Physical Exam Details Patient Name: Date of Service: [name] [name], [place] 04/03/2020 3:30 PM Medical Record Number: [id number] Patient Account Number: [id number] Date of Birth/Sex: Treating RN: 1965-07-26 (55 y.o. [name] [name] Primary Care Provider: [name] Other Clinician: Referring Provider: Treating Provider/Extender: Tenny Craw in Treatment: 1 Constitutional Patient  is hypertensive.. Pulse regular and within target range for patient.Marland Kitchen Respirations regular, non-labored and within target range.. Temperature is normal and within the target range for the patient.Marland Kitchen Appears in no distress. Cardiovascular Pedal pulses palpable and strong on the left. Notes Wound exam; the  distal area seems to have closed over although I am not really happy with the dry callus the skin here. The more proximal wound actually is larger. Still some debris on the surface. Because of the patient's complaints of pain I did not debride this. It does not look infected. His edema control is strictly marginal Electronic Signature(s) Signed: 04/03/2020 5:25:02 PM By: Baltazar Najjar MD Entered By: Baltazar Najjar on 04/03/2020 16:14:39 -------------------------------------------------------------------------------- Physician Orders Details Patient Name: Date of Service: [place], [place] 04/03/2020 3:30 PM Medical Record Number: [id number] Patient Account Number: [id number] Date of Birth/Sex: Treating RN: 23-Feb-1965 (55 y.o. [name] [name] Primary Care Provider: [name] Other Clinician: Referring Provider: Treating Provider/Extender: Tenny Craw in Treatment: 1 Verbal / Phone Orders: No Diagnosis Coding ICD-10 Coding Code Description I87.2 Venous insufficiency (chronic) (peripheral) L97.822 Non-pressure chronic ulcer of other part of left lower leg with fat layer exposed I10 Essential (primary) hypertension E11.622 Type 2 diabetes mellitus with other skin ulcer G47.30 Sleep apnea, unspecified Follow-up Appointments Return Appointment in 1 week. Bathing/ Shower/ Hygiene May shower with protection but do not get wound dressing(s) wet. - use cast protector to cover wrap in shower Edema Control - Lymphedema / SCD / Other Bilateral Lower Extremities Elevate legs to the level of the heart or above for 30 minutes daily and/or when sitting, a frequency of: - 3-4 times per day Avoid standing for long periods of time. Exercise regularly Wound Treatment Wound #1 - Lower Leg Wound Laterality: Left, Anterior, Proximal Peri-Wound Care: Sween Lotion (Moisturizing lotion) 1 x Per Week/7 Days Discharge Instructions: Apply moisturizing lotion as  directed Prim Dressing: Hydrofera Blue Classic Foam, 2x2 in 1 x Per Week/7 Days ary Discharge Instructions: Moisten with saline prior to applying to wound bed Secondary Dressing: Woven Gauze Sponge, Non-Sterile 4x4 in 1 x Per Week/7 Days Discharge Instructions: Apply over primary dressing as directed. Compression Wrap: 2 press compression wrap 1 x Per Week/7 Days Wound #2 - Lower Leg Wound Laterality: Left, Anterior, Distal Peri-Wound Care: Sween Lotion (Moisturizing lotion) 1 x Per Week/7 Days Discharge Instructions: Apply moisturizing lotion as directed Prim Dressing: Hydrofera Blue Classic Foam, 2x2 in 1 x Per Week/7 Days ary Discharge Instructions: Moisten with saline prior to applying to wound bed Secondary Dressing: Woven Gauze Sponge, Non-Sterile 4x4 in 1 x Per Week/7 Days Discharge Instructions: Apply over primary dressing as directed. Compression Wrap: 2 press compression wrap 1 x Per Week/7 Days Electronic Signature(s) Signed: 04/03/2020 5:25:02 PM By: Baltazar Najjar MD Signed: 04/03/2020 5:33:43 PM By: Zenaida Deed RN, BSN Signed: 04/03/2020 5:33:43 PM By: Zenaida Deed RN, BSN Entered By: Zenaida Deed on 04/03/2020 16:10:03 -------------------------------------------------------------------------------- Problem List Details Patient Name: Date of Service: [place], [place] 04/03/2020 3:30 PM Medical Record Number: [id number] Patient Account Number: [id number] Date of Birth/Sex: Treating RN: 18-Oct-1965 (55 y.o. [name] [name] Primary Care Provider: [name] Other Clinician: Referring Provider: Treating Provider/Extender: Tenny Craw in Treatment: 1 Active Problems ICD-10 Encounter Code Description Active Date MDM Diagnosis I87.2 Venous insufficiency (chronic) (peripheral) 03/25/2020 No Yes L97.822 Non-pressure chronic ulcer of other part of left lower leg with fat layer exposed2/10/2020 No Yes I10 Essential (primary) hypertension  03/25/2020 No  Yes E11.622 Type 2 diabetes mellitus with other skin ulcer 03/25/2020 No Yes G47.30 Sleep apnea, unspecified 03/25/2020 No Yes Inactive Problems Resolved Problems Electronic Signature(s) Signed: 04/03/2020 5:25:02 PM By: Baltazar Najjar MD Entered By: Baltazar Najjar on 04/03/2020 16:09:47 -------------------------------------------------------------------------------- Progress Note Details Patient Name: Date of Service: [place], [place] 04/03/2020 3:30 PM Medical Record Number: [id number] Patient Account Number: [id number] Date of Birth/Sex: Treating RN: 07-Oct-1965 (55 y.o. [name] [name] Primary Care Provider: [name] Other Clinician: Referring Provider: Treating Provider/Extender: Tenny Craw in Treatment: 1 Subjective History of Present Illness (HPI) 03/25/2020 upon evaluation today patient appears to be doing somewhat poorly in regard to his left lower extremity on the anterior portion. He has 2 wounds here that are actually giving him quite a bit of pain. They occurred due to an injury on December 12 when he struck his anterior portion of the leg with a window. He was carrying this at the time. With that being said it was a little cut he thought it would heal he put a little bit antibiotic ointment was using peroxide. With that being said he tells me that on December 19 [facility] because it did not seem to be healing appropriately they give him Keflex told him to quit using the peroxide and it he was hoping that would be the end of it. Unfortunately 2 weeks later on January 5 he ended up in the ER at [facility] where they gave him doxycycline and recommended that he come here. He does have a history of diabetes mellitus type 2 his most recent hemoglobin A1c on October 2021 was 6.7. With that being said he tells me he does not take any medications for this currently. He does also have a history of sleep apnea though his  ischium is broken at the moment he does know he needs to call to get that fixed. He also has hypertension which has been higher due to the fact that his sleep apnea is not controlled he tells me. With regard to his wound I think the biggest issue we see here is really that he has significant swelling due to venous stasis that likely is limiting his ability to be able to heal appropriately. I think that we may need to see what we can do to improve his edema status. 2/18; this was a patient who was admitted that the clinic last week. He has a area on the left anterior lower leg x2 when he arrived here. Changes of chronic venous insufficiency. We used Iodoflex under a 2 layer compression system. He arrives back in clinic complaining of a lot of pain, pain with elevation of his leg. 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 He arrives in clinic with the more distal wound closed over although there is some callus in this area. The more proximal wound is larger still with some debris on the center. Because of his aggression of the complaints of pain I did not debride this. Change the primary dressing to [facility] Blue question irritation from Iodoflex Objective Constitutional Patient is hypertensive.. Pulse regular and within target range for patient.Marland Kitchen Respirations regular, non-labored and within target range.. Temperature is normal and within the target range for the patient.Marland Kitchen Appears in no distress. Vitals Time  Taken: 3:38 PM, Height: 72 in, Weight: 360 lbs, BMI: 48.8, Temperature: 98.5 F, Pulse: 94 bpm, Respiratory Rate: 20 breaths/min, Blood Pressure: 185/113 mmHg. Cardiovascular Pedal pulses palpable and strong on the left. General Notes: Wound exam; the distal area seems to have closed over although I am not  really happy with the dry callus the skin here. The more proximal wound actually is larger. Still some debris on the surface. Because of the patient's complaints of pain I did not debride this. It does not look infected. His edema control is strictly marginal Integumentary (Hair, Skin) Wound #1 status is Open. Original cause of wound was Trauma. The wound is located on the Left,Proximal,Anterior Lower Leg. The wound measures 1.7cm length x 2.1cm width x 0.2cm depth; 2.804cm^2 area and 0.561cm^3 volume. There is Fat Layer (Subcutaneous Tissue) exposed. There is no tunneling or undermining noted. There is a medium amount of serosanguineous drainage noted. The wound margin is flat and intact. There is large (67-100%) pink granulation within the wound bed. There is a small (1-33%) amount of necrotic tissue within the wound bed including Eschar and Adherent Slough. Wound #2 status is Open. Original cause of wound was Trauma. The wound is [facility] the [facility],Distal,Anterior Lower Leg. The wound measures 0.1cm length x 0.1cm width x 0.1cm depth; 0.008cm^2 area and 0.001cm^3 volume. There is Fat Layer (Subcutaneous Tissue) exposed. There is no tunneling or undermining noted. There is a medium amount of serosanguineous drainage noted. The wound margin is flat and intact. There is large (67-100%) pink granulation within the wound bed. There is no necrotic tissue within the wound bed. Assessment Active Problems ICD-10 Venous insufficiency (chronic) (peripheral) Non-pressure chronic ulcer of other part of left lower leg with fat layer exposed Essential (primary) hypertension Type 2 diabetes mellitus with other skin ulcer Sleep apnea, unspecified Procedures Wound #1 Pre-procedure diagnosis of Wound #1 is a Venous Leg Ulcer located on the Left,Proximal,Anterior Lower Leg . There was a Double Layer Compression Therapy Procedure by Shawn Stalleaton, Bobbi, RN. Post procedure Diagnosis Wound #1: Same as  Pre-Procedure Plan Follow-up Appointments: Return Appointment in 1 week. Bathing/ Shower/ Hygiene: May shower with protection but do not get wound dressing(s) wet. - use cast protector to cover wrap in shower Edema Control - Lymphedema / SCD / Other: Elevate legs to the level of the heart or above for 30 minutes daily and/or when sitting, a frequency of: - 3-4 times per day Avoid standing for long periods of time. Exercise regularly WOUND #1: - Lower Leg Wound Laterality: Left, Anterior, Proximal Peri-Wound Care: Sween Lotion (Moisturizing lotion) 1 x Per Week/7 Days Discharge Instructions: Apply moisturizing lotion as directed Prim Dressing: Hydrofera Blue Classic Foam, 2x2 in 1 x Per Week/7 Days ary Discharge Instructions: Moisten with saline prior to applying to wound bed Secondary Dressing: Woven Gauze Sponge, Non-Sterile 4x4 in 1 x Per Week/7 Days Discharge Instructions: Apply over primary dressing as directed. Com pression Wrap: 2 press compression wrap 1 x Per Week/7 Days WOUND #2: - Lower Leg Wound Laterality: Left, Anterior, Distal Peri-Wound Care: Sween Lotion (Moisturizing lotion) 1 x Per Week/7 Days Discharge Instructions: Apply moisturizing lotion as directed Prim Dressing: Hydrofera Blue Classic Foam, 2x2 in 1 x Per Week/7 Days ary Discharge Instructions: Moisten with saline prior to applying to wound bed Secondary Dressing: Woven Gauze Sponge, Non-Sterile 4x4 in 1 x Per Week/7 Days Discharge Instructions: Apply over primary dressing as directed. Com pression Wrap: 2 press compression wrap 1 x Per Week/7 Days  1. I change the primary dressing here to [facility] hoping perhaps the Iodoflex was is source of some of this patient's complaint of discomfort. 2. He is on his feet for 6 hours a day I think he will probably need 4-layer compression. I asked him to bring his work boot next time to see if we can wrap more tightly especially if the wound does not progress here. 3.  His blood pressure here was really very high although he says he has recently been to his cardiologist and had medication adjustments he has not picked up the medications. He also relates that his blood pressure is much better in his home environment at roughly 145/85 4. I do not think there is any infection or significant PAD even though he complains about pain when he elevates his legs. His pedal pulses on the left are really quite robust Electronic Signature(s) Signed: 04/03/2020 5:25:02 PM By: Baltazar Najjar MD Entered By: Baltazar Najjar on 04/03/2020 16:17:20 -------------------------------------------------------------------------------- SuperBill Details Patient Name: Date of Service: [place], [place] 04/03/2020 Medical Record Number: [id number] Patient Account Number: [id number] Date of Birth/Sex: Treating RN: 05/25/1965 (55 y.o. [name] [name] Primary Care Provider: [name] Other Clinician: Referring Provider: Treating Provider/Extender: Tenny Craw in Treatment: 1 Diagnosis Coding ICD-10 Codes Code Description I87.2 Venous insufficiency (chronic) (peripheral) L97.822 Non-pressure chronic ulcer of other part of left lower leg with fat layer exposed I10 Essential (primary) hypertension E11.622 Type 2 diabetes mellitus with other skin ulcer G47.30 Sleep apnea, unspecified Facility Procedures CPT4 Code: [id number] Description: (Facility Use Only) [phone] - APPLY MULTLAY COMPRS LWR LT LEG Modifier: Quantity: 1 Physician Procedures : CPT4 Code Description Modifier [id number] - WC PHYS LEVEL 3 - EST PT ICD-10 Diagnosis Description L97.822 Non-pressure chronic ulcer of other part of left lower leg with fat layer exposed I87.2 Venous insufficiency (chronic) (peripheral) Quantity: 1 Electronic Signature(s) Signed: 04/03/2020 5:25:02 PM By: Baltazar Najjar MD Entered By: Baltazar Najjar on 04/03/2020 16:16:52

## 2020-04-10 ENCOUNTER — Encounter (HOSPITAL_BASED_OUTPATIENT_CLINIC_OR_DEPARTMENT_OTHER): Payer: BC Managed Care – PPO | Admitting: Internal Medicine

## 2020-04-10 ENCOUNTER — Other Ambulatory Visit: Payer: Self-pay

## 2020-04-10 DIAGNOSIS — E11622 Type 2 diabetes mellitus with other skin ulcer: Secondary | ICD-10-CM | POA: Diagnosis not present

## 2020-04-10 NOTE — Progress Notes (Signed)
DALVIN, CLIPPER ([id number]) Visit Report for 04/10/2020 Arrival Information Details Patient Name: Date of Service: [place], [place] [facility] 04/10/2020 2:30 PM Medical Record Number: [id number] Patient Account Number: [id number] Date of Birth/Sex: Treating RN: 05-31-1965 (55 y.o. [name], [name] Primary Care Provider: [name] Other Clinician: Referring Provider: Treating Provider/Extender: Tenny Craw in Treatment: 2 Visit Information History Since Last Visit Added or deleted any medications: No Patient Arrived: Ambulatory Any new allergies or adverse reactions: No Arrival Time: 14:24 Had a fall or experienced change in No Accompanied By: self activities of daily living that may affect Transfer Assistance: None risk of falls: Patient Identification Verified: Yes Signs or symptoms of abuse/neglect since last visito No Secondary Verification Process Completed: Yes Hospitalized since last visit: No Patient Requires Transmission-Based Precautions: No Implantable device outside of the clinic excluding No Patient Has Alerts: Yes cellular tissue based products placed in the center Patient Alerts: L ABI non compressible since last visit: Has Dressing in Place as Prescribed: Yes Pain Present Now: Yes Electronic Signature(s) Signed: 04/10/2020 5:08:13 PM By: Fonnie Mu RN Entered By: Fonnie Mu on 04/10/2020 14:26:00 -------------------------------------------------------------------------------- Compression Therapy Details Patient Name: Date of Service: [place], [place] 04/10/2020 2:30 PM Medical Record Number: [id number] Patient Account Number: [id number] Date of Birth/Sex: Treating RN: 1966-02-12 (55 y.o. [name] Primary Care Provider: [name] Other Clinician: Referring Provider: Treating Provider/Extender: Tenny Craw in Treatment: 2 Compression Therapy Performed for Wound Assessment: Wound #1  Left,Proximal,Anterior Lower Leg Performed By: Clinician Shawn Stall, RN Compression Type: Four Layer Post Procedure Diagnosis Same as Pre-procedure Electronic Signature(s) Signed: 04/10/2020 4:54:48 PM By: Zenaida Deed RN, BSN Entered By: Zenaida Deed on 04/10/2020 14:58:58 -------------------------------------------------------------------------------- Encounter Discharge Information Details Patient Name: Date of Service: [place], [place] 04/10/2020 2:30 PM Medical Record Number: [id number] Patient Account Number: [id number] Date of Birth/Sex: Treating RN: 03-Nov-1965 (55 y.o. [name] Primary Care Provider: [name] Other Clinician: Referring Provider: Treating Provider/Extender: Tenny Craw in Treatment: 2 Encounter Discharge Information Items Discharge Condition: Stable Ambulatory Status: Ambulatory Discharge Destination: Home Transportation: Private Auto Accompanied By: self Schedule Follow-up Appointment: Yes Clinical Summary of Care: Electronic Signature(s) Signed: 04/10/2020 5:07:55 PM By: Shawn Stall Entered By: Shawn Stall on 04/10/2020 17:06:04 -------------------------------------------------------------------------------- Lower Extremity Assessment Details Patient Name: Date of Service: [place] 04/10/2020 2:30 PM Medical Record Number: [id number] Patient Account Number: [id number] Date of Birth/Sex: Treating RN: 1966-01-05 (55 y.o. [name], [name] Primary Care Provider: [name] Other Clinician: Referring Provider: Treating Provider/Extender: Tenny Craw in Treatment: 2 Edema Assessment Assessed: Kyra Searles: Yes] Franne Forts: No] Edema: [Left: Ye] [Right: s] Calf Left: Right: Point of Measurement: 40 cm From Medial Instep 49.5 cm Ankle Left: Right: Point of Measurement: 10 cm From Medial Instep 30.2 cm Vascular Assessment Pulses: Dorsalis Pedis Palpable:  [Left:Yes] Posterior Tibial Palpable: [Left:Yes] Electronic Signature(s) Signed: 04/10/2020 5:08:13 PM By: Fonnie Mu RN Entered By: Fonnie Mu on 04/10/2020 14:36:56 -------------------------------------------------------------------------------- Multi Wound Chart Details Patient Name: Date of Service: [place], [place] 04/10/2020 2:30 PM Medical Record Number: [id number] Patient Account Number: [id number] Date of Birth/Sex: Treating RN: 11-14-1965 (55 y.o. [name] Primary Care Provider: [name] Other Clinician: Referring Provider: Treating Provider/Extender: Tenny Craw in Treatment: 2 Vital Signs Height(in): 72 Pulse(bpm): 99 Weight(lbs): 360 Blood Pressure(mmHg): 178/102 Body Mass Index(BMI): 49 Temperature(F): 97.9 Respiratory Rate(breaths/min): 17 Photos: [1:No Photos Left, Proximal, Anterior Lower Leg] [2:No Photos Left,  Distal, Anterior Lower Leg] [N/A:N/A N/A] Wound Location: [1:Trauma] [2:Trauma] [N/A:N/A] Wounding Event: [1:Venous Leg Ulcer] [2:Venous Leg Ulcer] [N/A:N/A] Primary Etiology: [1:Sleep Apnea, Hypertension, Type II N/A] [N/A:N/A] Comorbid History: [1:Diabetes, Gout 01/26/2020] [2:01/26/2020] [N/A:N/A] Date Acquired: [1:2] [2:2] [N/A:N/A] Weeks of Treatment: [1:Open] [2:Healed - Epithelialized] [N/A:N/A] Wound Status: [1:1.8x2x0.2] [2:0x0x0] [N/A:N/A] Measurements L x W x D (cm) [1:2.827] [2:0] [N/A:N/A] A (cm) : rea [1:0.565] [2:0] [N/A:N/A] Volume (cm) : [1:-41.10%] [2:100.00%] [N/A:N/A] % Reduction in Area: [1:-182.50%] [2:100.00%] [N/A:N/A] % Reduction in Volume: [1:Full Thickness Without Exposed] [2:Full Thickness Without Exposed] [N/A:N/A] Classification: [1:Support Structures Medium] [2:Support Structures N/A] [N/A:N/A] Exudate A mount: [1:Serosanguineous] [2:N/A] [N/A:N/A] Exudate Type: [1:red, brown] [2:N/A] [N/A:N/A] Exudate Color: [1:Flat and Intact] [2:N/A] [N/A:N/A] Wound Margin:  [1:Medium (34-66%)] [2:N/A] [N/A:N/A] Granulation Amount: [1:Pink] [2:N/A] [N/A:N/A] Granulation Quality: [1:Medium (34-66%)] [2:N/A] [N/A:N/A] Necrotic Amount: [1:Eschar, Adherent Slough] [2:N/A] [N/A:N/A] Necrotic Tissue: [1:Fat Layer (Subcutaneous Tissue): Yes N/A] [N/A:N/A] Exposed Structures: [1:Fascia: No Tendon: No Muscle: No Joint: No Bone: No Small (1-33%)] [2:N/A] [N/A:N/A] Epithelialization: [1:Compression Therapy] [2:N/A] [N/A:N/A] Treatment Notes Electronic Signature(s) Signed: 04/10/2020 4:32:00 PM By: Baltazar Najjar MD Signed: 04/10/2020 4:54:48 PM By: Zenaida Deed RN, BSN Entered By: Baltazar Najjar on 04/10/2020 15:19:45 -------------------------------------------------------------------------------- Multi-Disciplinary Care Plan Details Patient Name: Date of Service: [place], [place] 04/10/2020 2:30 PM Medical Record Number: [id number] Patient Account Number: [id number] Date of Birth/Sex: Treating RN: [birth year] (55 y.o. [name] Primary Care Provider: [name] Other Clinician: Referring Provider: Treating Provider/Extender: Tenny Craw in Treatment: 2 Multidisciplinary Care Plan reviewed with physician Active Inactive Nutrition Nursing Diagnoses: Impaired glucose control: actual or potential Potential for alteratiion in Nutrition/Potential for imbalanced nutrition Goals: Patient/caregiver will maintain therapeutic glucose control Date Initiated: 03/25/2020 Target Resolution Date: 04/22/2020 Goal Status: Active Interventions: Assess HgA1c results as ordered upon admission and as needed Assess patient nutrition upon admission and as needed per policy Provide education on elevated blood sugars and impact on wound healing Treatment Activities: Patient referred to Primary Care Physician for further nutritional evaluation : 03/25/2020 Notes: Venous Leg Ulcer Nursing Diagnoses: Knowledge deficit related to disease process  and management Potential for venous Insuffiency (use before diagnosis confirmed) Goals: Patient will maintain optimal edema control Date Initiated: 03/25/2020 Target Resolution Date: 04/22/2020 Goal Status: Active Interventions: Assess peripheral edema status every visit. Compression as ordered Treatment Activities: Therapeutic compression applied : 03/25/2020 Notes: Wound/Skin Impairment Nursing Diagnoses: Impaired tissue integrity Knowledge deficit related to smoking impact on wound healing Knowledge deficit related to ulceration/compromised skin integrity Goals: Patient/caregiver will verbalize understanding of skin care regimen Date Initiated: 03/25/2020 Target Resolution Date: 04/22/2020 Goal Status: Active Ulcer/skin breakdown will have a volume reduction of 30% by week 4 Date Initiated: 03/25/2020 Target Resolution Date: 04/22/2020 Goal Status: Active Interventions: Assess patient/caregiver ability to obtain necessary supplies Assess patient/caregiver ability to perform ulcer/skin care regimen upon admission and as needed Assess ulceration(s) every visit Provide education on ulcer and skin care Treatment Activities: Skin care regimen initiated : 03/25/2020 Topical wound management initiated : 03/25/2020 Notes: Electronic Signature(s) Signed: 04/10/2020 4:54:48 PM By: Zenaida Deed RN, BSN Entered By: Zenaida Deed on 04/10/2020 14:57:42 -------------------------------------------------------------------------------- Pain Assessment Details Patient Name: Date of Service: [place], [place] 04/10/2020 2:30 PM Medical Record Number: [id number] Patient Account Number: [id number] Date of Birth/Sex: Treating RN: January 29, 1966 (56 y.o. [name] Primary Care Provider: [name] Other Clinician: Referring Provider: Treating Provider/Extender: Tenny Craw in Treatment: 2 Active Problems Location of Pain Severity and Description of  Pain Patient  Has Paino Yes Site Locations Pain Location: Pain in Ulcers With Dressing Change: Yes Duration of the Pain. Constant / Intermittento Intermittent Rate the pain. Current Pain Level: 3 Worst Pain Level: 10 Least Pain Level: 0 Tolerable Pain Level: 7 Character of Pain Describe the Pain: Aching Pain Management and Medication Current Pain Management: Medication: Yes Cold Application: No Rest: Yes Massage: No Activity: No T.E.N.S.: No Heat Application: No Leg drop or elevation: No Is the Current Pain Management Adequate: Adequate How does your wound impact your activities of daily livingo Sleep: No Bathing: No Appetite: No Relationship With Others: No Bladder Continence: No Emotions: No Bowel Continence: No Work: No Toileting: No Drive: No Dressing: No Hobbies: No Electronic Signature(s) Signed: 04/10/2020 5:08:13 PM By: Fonnie Mu RN Entered By: Fonnie Mu on 04/10/2020 14:36:44 -------------------------------------------------------------------------------- Patient/Caregiver Education Details Patient Name: Date of Service: Perlie [place] 2/25/2022andnbsp2:30 PM Medical Record Number: [id number] Patient Account Number: [id number] Date of Birth/Gender: Treating RN: 20-Nov-1965 (55 y.o. [name] Primary Care Physician: [name] Other Clinician: Referring Physician: Treating Physician/Extender: Tenny Craw in Treatment: 2 Education Assessment Education Provided To: Patient Education Topics Provided Venous: Methods: Explain/Verbal Responses: Reinforcements needed, State content correctly Wound/Skin Impairment: Methods: Explain/Verbal Responses: Reinforcements needed, State content correctly Electronic Signature(s) Signed: 04/10/2020 4:54:48 PM By: Zenaida Deed RN, BSN Entered By: Zenaida Deed on 04/10/2020 14:58:07 -------------------------------------------------------------------------------- Wound  Assessment Details Patient Name: Date of Service: [place], [place] 04/10/2020 2:30 PM Medical Record Number: [id number] Patient Account Number: [id number] Date of Birth/Sex: Treating RN: 1965-07-28 (55 y.o. [name], [name] Primary Care Provider: [name] Other Clinician: Referring Provider: Treating Provider/Extender: Tenny Craw in Treatment: 2 Wound Status Wound Number: 1 Primary Etiology: Venous Leg Ulcer Wound Location: Left, Proximal, Anterior Lower Leg Wound Status: Open Wounding Event: Trauma Comorbid History: Sleep Apnea, Hypertension, Type II Diabetes, Gout Date Acquired: 01/26/2020 Weeks Of Treatment: 2 Clustered Wound: No Photos Wound Measurements Length: (cm) 1.8 Width: (cm) 2 Depth: (cm) 0.2 Area: (cm) 2.827 Volume: (cm) 0.565 % Reduction in Area: -41.1% % Reduction in Volume: -182.5% Epithelialization: Small (1-33%) Tunneling: No Undermining: No Wound Description Classification: Full Thickness Without Exposed Support Structures Wound Margin: Flat and Intact Exudate Amount: Medium Exudate Type: Serosanguineous Exudate Color: red, brown Foul Odor After Cleansing: No Slough/Fibrino Yes Wound Bed Granulation Amount: Medium (34-66%) Exposed Structure Granulation Quality: Pink Fascia Exposed: No Necrotic Amount: Medium (34-66%) Fat Layer (Subcutaneous Tissue) Exposed: Yes Necrotic Quality: Eschar, Adherent Slough Tendon Exposed: No Muscle Exposed: No Joint Exposed: No Bone Exposed: No Treatment Notes Wound #1 (Lower Leg) Wound Laterality: Left, Anterior, Proximal Cleanser Peri-Wound Care Sween Lotion (Moisturizing lotion) Discharge Instruction: Apply moisturizing lotion as directed Topical Primary Dressing Hydrofera Blue Classic Foam, 2x2 in Discharge Instruction: Moisten with saline prior to applying to wound bed Secondary Dressing Woven Gauze Sponge, Non-Sterile 4x4 in Discharge Instruction: Apply over  primary dressing as directed. Secured With Compression Wrap FourPress (4 layer compression wrap) Discharge Instruction: Apply four layer compression as directed. Compression Stockings Add-Ons Electronic Signature(s) Signed: 04/10/2020 4:34:48 PM By: Karl Ito Signed: 04/10/2020 5:08:13 PM By: Fonnie Mu RN Entered By: Karl Ito on 04/10/2020 16:33:49 -------------------------------------------------------------------------------- Wound Assessment Details Patient Name: Date of Service: [place], [place] 04/10/2020 2:30 PM Medical Record Number: [id number] Patient Account Number: [id number] Date of Birth/Sex: Treating RN: [birth year] (55 y.o. [name], [name] Primary Care Provider: [name] Other Clinician: Referring Provider: Treating Provider/Extender: Erich Montane  Weeks in Treatment: 2 Wound Status Wound Number: 2 Primary Etiology: Venous Leg Ulcer Wound Location: Left, Distal, Anterior Lower Leg Wound Status: Healed - Epithelialized Wounding Event: Trauma Date Acquired: 01/26/2020 Weeks Of Treatment: 2 Clustered Wound: No Wound Measurements Length: (cm) Width: (cm) Depth: (cm) Area: (cm) Volume: (cm) 0 % Reduction in Area: 100% 0 % Reduction in Volume: 100% 0 0 0 Wound Description Classification: Full Thickness Without Exposed Support Structur es Treatment Notes Wound #2 (Lower Leg) Wound Laterality: Left, Anterior, Distal Cleanser Peri-Wound Care Topical Primary Dressing Secondary Dressing Secured With Compression Wrap Compression Stockings Add-Ons Electronic Signature(s) Signed: 04/10/2020 5:08:13 PM By: Fonnie MuBreedlove, [name] RN Entered By: Fonnie MuBreedlove, [name] on 04/10/2020 14:37:08 -------------------------------------------------------------------------------- Vitals Details Patient Name: Date of Service: [place], [place] 04/10/2020 2:30 PM Medical Record Number: [id number] Patient Account Number:  [id number] Date of Birth/Sex: Treating RN: Oct 11, 1965 (55 y.o. Charlean MerlM) Breedlove, [name] Primary Care Provider: Azzie RoupAnderson, Shane Other Clinician: Referring Provider: Treating Provider/Extender: Tenny Crawobson, Michael Anderson, Shane Weeks in Treatment: 2 Vital Signs Time Taken: 14:26 Temperature (F): 97.9 Height (in): 72 Pulse (bpm): 99 Weight (lbs): 360 Respiratory Rate (breaths/min): 17 Body Mass Index (BMI): 48.8 Blood Pressure (mmHg): 178/102 Reference Range: 80 - 120 mg / dl Electronic Signature(s) Signed: 04/10/2020 5:08:13 PM By: Fonnie MuBreedlove, [name] RN Entered By: Fonnie MuBreedlove, [name] on 04/10/2020 14:34:45

## 2020-04-10 NOTE — Progress Notes (Signed)
KREG, EARHART ([id number]) Visit Report for 04/10/2020 HPI Details Patient Name: Date of Service: [place], [place] 04/10/2020 2:30 PM Medical Record Number: [id number] Patient Account Number: [id number] Date of Birth/Sex: Treating RN: [birth year] (55 y.o. [name] Primary Care Provider: [name] Other Clinician: Referring Provider: Treating Provider/Extender: Tenny Craw in Treatment: 2 History of Present Illness HPI Description: 03/25/2020 upon evaluation today patient appears to be doing somewhat poorly in regard to his left lower extremity on the anterior portion. He has 2 wounds here that are actually giving him quite a bit of pain. They occurred due to an injury on December 12 when he struck his anterior portion of the leg with a window. He was carrying this at the time. With that being said it was a little cut he thought it would heal he put a little bit antibiotic ointment was using peroxide. With that being said he tells me that on December 19 he actually went [facility] because it did not seem to be healing appropriately they give him Keflex told him to quit using the peroxide and it he was hoping that would be the end of it. Unfortunately 2 weeks later on January 5 he ended up in the ER at [facility] where they gave him doxycycline and recommended that he come here. He does have a history of diabetes mellitus type 2 his most recent hemoglobin A1c on October 2021 was 6.7. With that being said he tells me he does not take any medications for this currently. He does also have a history of sleep apnea though his ischium is broken at the moment he does know he needs to call to get that fixed. He also has hypertension which has been higher due to the fact that his sleep apnea is not controlled he tells me. With regard to his wound I think the biggest issue we see here is really that he has significant swelling due to venous stasis that likely is limiting his  ability to be able to heal appropriately. I think that we may need to see what we can do to improve his edema status. 2/18; this was a patient who was admitted that the clinic last week. He has a area on the left anterior lower leg x2 when he arrived here. Changes of chronic venous insufficiency. We used Iodoflex under a 2 layer compression system. He arrives back in clinic complaining of a lot of pain, pain with elevation of his leg. 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 He arrives in clinic with the more distal wound closed over although there is some callus in this area. The more proximal wound is larger still with some debris on the center. Because of his aggression of the complaints of pain I did not debride this. Change the primary dressing to [facility] question irritation from Iodoflex 2/25; left anterior lower leg. Better controlled today. We are going to try 4-layer compression on him today. Hydrofera Blue seems to be better tolerated than I had of Electronic Signature(s) Signed: 04/10/2020 4:32:00 PM By: Baltazar Najjar MD Entered By: Baltazar Najjar on 04/10/2020 15:20:30 -------------------------------------------------------------------------------- Physical Exam Details Patient Name: Date of Service: [place], [place] 04/10/2020 2:30 PM Medical Record Number: [id number] Patient Account Number: [id number] Date of Birth/Sex:  Treating RN: 1965/08/30 (55 y.o. [name] Primary Care Provider: [name] Other Clinician: Referring Provider: Treating Provider/Extender: Tenny Craw in Treatment: 2 Constitutional Patient is hypertensive.. Pulse regular and within target range for patient.Marland Kitchen Respirations regular, non-labored and within target range.. Temperature  is normal and within the target range for the patient.Marland Kitchen Appears in no distress. Cardiovascular Pedal pulses palpable. Notes Wound exam; distal area seems to have closed the superior area slightly smaller still debris on the surface washes soft with Anasept and gauze. He does not tolerate this well. His swelling control is better Electronic Signature(s) Signed: 04/10/2020 4:32:00 PM By: Baltazar Najjar MD Entered By: Baltazar Najjar on 04/10/2020 15:21:25 -------------------------------------------------------------------------------- Physician Orders Details Patient Name: Date of Service: [place], [place] 04/10/2020 2:30 PM Medical Record Number: [id number] Patient Account Number: [id number] Date of Birth/Sex: Treating RN: Jun 28, 1965 (55 y.o. [name] Primary Care Provider: [name] Other Clinician: Referring Provider: Treating Provider/Extender: Tenny Craw in Treatment: 2 Verbal / Phone Orders: No Diagnosis Coding ICD-10 Coding Code Description I87.2 Venous insufficiency (chronic) (peripheral) L97.822 Non-pressure chronic ulcer of other part of left lower leg with fat layer exposed I10 Essential (primary) hypertension E11.622 Type 2 diabetes mellitus with other skin ulcer G47.30 Sleep apnea, unspecified Follow-up Appointments Return Appointment in 1 week. Bathing/ Shower/ Hygiene May shower with protection but do not get wound dressing(s) wet. - use cast protector to cover wrap in shower Edema Control - Lymphedema / SCD / Other Bilateral Lower Extremities Elevate legs to the level of the heart or above for 30 minutes daily and/or when sitting, a frequency of: - 3-4 times per day Avoid standing for long periods of time. Exercise regularly Wound Treatment Wound #1 - Lower Leg Wound Laterality: Left, Anterior, Proximal Peri-Wound Care: Sween Lotion (Moisturizing lotion) 1 x Per Week/7 Days Discharge Instructions: Apply moisturizing  lotion as directed Prim Dressing: Hydrofera Blue Classic Foam, 2x2 in 1 x Per Week/7 Days ary Discharge Instructions: Moisten with saline prior to applying to wound bed Secondary Dressing: Woven Gauze Sponge, Non-Sterile 4x4 in 1 x Per Week/7 Days Discharge Instructions: Apply over primary dressing as directed. Compression Wrap: FourPress (4 layer compression wrap) 1 x Per Week/7 Days Discharge Instructions: Apply four layer compression as directed. Electronic Signature(s) Signed: 04/10/2020 4:32:00 PM By: Baltazar Najjar MD Signed: 04/10/2020 4:54:48 PM By: Zenaida Deed RN, BSN Entered By: Zenaida Deed on 04/10/2020 15:00:45 -------------------------------------------------------------------------------- Problem List Details Patient Name: Date of Service: [place], [place] 04/10/2020 2:30 PM Medical Record Number: [id number] Patient Account Number: [id number] Date of Birth/Sex: Treating RN: 18-Feb-1965 (55 y.o. [name] Primary Care Provider: [name] Other Clinician: Referring Provider: Treating Provider/Extender: Tenny Craw in Treatment: 2 Active Problems ICD-10 Encounter Code Description Active Date MDM Diagnosis I87.2 Venous insufficiency (chronic) (peripheral) 03/25/2020 No Yes L97.822 Non-pressure chronic ulcer of other part of left lower leg with fat layer exposed2/10/2020 No Yes I10 Essential (primary) hypertension 03/25/2020 No Yes E11.622 Type 2 diabetes mellitus with other skin ulcer 03/25/2020 No Yes G47.30 Sleep apnea, unspecified 03/25/2020 No Yes Inactive Problems Resolved Problems Electronic Signature(s) Signed: 04/10/2020 4:32:00 PM By: Baltazar Najjar MD Entered By: Baltazar Najjar on 04/10/2020 15:19:37 -------------------------------------------------------------------------------- Progress Note Details Patient Name: Date of Service: [place], [place] 04/10/2020 2:30 PM Medical Record Number: [id number] Patient Account Number:  [id number] Date of Birth/Sex: Treating RN: [birth year] (55 y.o. [name] Primary Care Provider: [name] Other Clinician: Referring Provider:  Treating Provider/Extender: Tenny Craw in Treatment: 2 Subjective History of Present Illness (HPI) 03/25/2020 upon evaluation today patient appears to be doing somewhat poorly in regard to his left lower extremity on the anterior portion. He has 2 wounds here that are actually giving him quite a bit of pain. They occurred due to an injury on December 12 when he struck his anterior portion of the leg with a window. He was carrying this at the time. With that being said it was a little cut he thought it would heal he put a little bit antibiotic ointment was using peroxide. With that being said he tells me that on December 19 [facility] because it did not seem to be healing appropriately they give him Keflex told him to quit using the peroxide and it he was hoping that would be the end of it. Unfortunately 2 weeks later on January 5 he ended up in the ER at [facility] where they gave him doxycycline and recommended that he come here. He does have a history of diabetes mellitus type 2 his most recent hemoglobin A1c on October 2021 was 6.7. With that being said he tells me he does not take any medications for this currently. He does also have a history of sleep apnea though his ischium is broken at the moment he does know he needs to call to get that fixed. He also has hypertension which has been higher due to the fact that his sleep apnea is not controlled he tells me. With regard to his wound I think the biggest issue we see here is really that he has significant swelling due to venous stasis that likely is limiting his ability to be able to heal appropriately. I think that we may need to see what we can do to improve his edema status. 2/18; this was a patient who was admitted that the clinic last week.  He has a area on the left anterior lower leg x2 when he arrived here. Changes of chronic venous insufficiency. We used Iodoflex under a 2 layer compression system. He arrives back in clinic complaining of a lot of pain, pain with elevation of his leg. 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 He arrives in clinic with the more distal wound closed over although there is some callus in this area. The more proximal wound is larger still with some debris on the center. Because of his aggression of the complaints of pain I did not debride this. Change the primary dressing to [facility] question irritation from Iodoflex 2/25; left anterior lower leg. Better controlled today. We are going to try 4-layer compression on him today. Hydrofera Blue seems to be better tolerated than I had of Objective Constitutional Patient is hypertensive.. Pulse regular and within target range for patient.Marland Kitchen Respirations regular, non-labored and within target range.. Temperature is normal and within the target range for the patient.Marland Kitchen Appears in no distress. Vitals Time Taken: 2:26 PM, Height: 72 in, Weight: 360 lbs, BMI: 48.8, Temperature: 97.9 F, Pulse: 99 bpm, Respiratory Rate: 17 breaths/min, Blood Pressure: 178/102 mmHg. Cardiovascular Pedal pulses palpable. General Notes: Wound exam; distal area seems to have closed the superior area slightly smaller still debris on the surface washes soft with Anasept and gauze. He  does not tolerate this well. His swelling control is better Integumentary (Hair, Skin) Wound #1 status is Open. Original cause of wound was Trauma. The date acquired was: 01/26/2020. The wound has been in treatment 2 weeks. The wound is located on the Left,Proximal,Anterior Lower Leg. The wound measures 1.8cm length x  2cm width x 0.2cm depth; 2.827cm^2 area and 0.565cm^3 volume. There is Fat Layer (Subcutaneous Tissue) exposed. There is no tunneling or undermining noted. There is a medium amount of serosanguineous drainage noted. The wound margin is flat and intact. There is medium (34-66%) pink granulation within the wound bed. There is a medium (34-66%) amount of necrotic tissue within the wound bed including Eschar and Adherent Slough. Wound #2 status is Healed - Epithelialized. Original cause of wound was Trauma. The date acquired was: 01/26/2020. The wound has been in treatment 2 weeks. The wound is located on the [facility],Distal,Anterior Lower Leg. The wound measures 0cm length x 0cm width x 0cm depth; 0cm^2 area and 0cm^3 volume. Assessment Active Problems ICD-10 Venous insufficiency (chronic) (peripheral) Non-pressure chronic ulcer of other part of left lower leg with fat layer exposed Essential (primary) hypertension Type 2 diabetes mellitus with other skin ulcer Sleep apnea, unspecified Procedures Wound #1 Pre-procedure diagnosis of Wound #1 is a Venous Leg Ulcer located on the Left,Proximal,Anterior Lower Leg . There was a Four Layer Compression Therapy Procedure by Shawn Stalleaton, Bobbi, RN. Post procedure Diagnosis Wound #1: Same as Pre-Procedure Plan Follow-up Appointments: Return Appointment in 1 week. Bathing/ Shower/ Hygiene: May shower with protection but do not get wound dressing(s) wet. - use cast protector to cover wrap in shower Edema Control - Lymphedema / SCD / Other: Elevate legs to the level of the heart or above for 30 minutes daily and/or when sitting, a frequency of: - 3-4 times per day Avoid standing for long periods of time. Exercise regularly WOUND #1: - Lower Leg Wound Laterality: Left, Anterior, Proximal Peri-Wound Care: Sween Lotion (Moisturizing lotion) 1 x Per Week/7 Days Discharge Instructions: Apply moisturizing lotion as directed Prim Dressing: Hydrofera Blue Classic  Foam, 2x2 in 1 x Per Week/7 Days ary Discharge Instructions: Moisten with saline prior to applying to wound bed Secondary Dressing: Woven Gauze Sponge, Non-Sterile 4x4 in 1 x Per Week/7 Days Discharge Instructions: Apply over primary dressing as directed. Compression Wrap: FourPress (4 layer compression wrap) 1 x Per Week/7 Days Discharge Instructions: Apply four layer compression as directed. 1. Still Hydrofera Blue I put him under 4-layer compression today. He does not tolerate debridement or even manipulation of the wound bed at all well Electronic Signature(s) Signed: 04/10/2020 4:32:00 PM By: Baltazar Najjarobson, Michael MD Entered By: Baltazar Najjarobson, Michael on 04/10/2020 15:22:13 -------------------------------------------------------------------------------- SuperBill Details Patient Name: [place] 04/10/2020 Medical Record Number: [id number] Patient Account Number: [id number] Date of Birth/Sex: Treating RN: 07/12/1965 (55 y.o. Damaris SchoonerM) Boehlein, Linda Primary Care Provider: Azzie RoupAnderson, Shane Other Clinician: Referring Provider: Treating Provider/Extender: Tenny Crawobson, Michael Anderson, Shane Weeks in Treatment: 2 Diagnosis Coding ICD-10 Codes Code Description I87.2 Venous insufficiency (chronic) (peripheral) L97.822 Non-pressure chronic ulcer of other part of left lower leg with fat layer exposed I10 Essential (primary) hypertension E11.622 Type 2 diabetes mellitus with other skin ulcer G47.30 Sleep apnea, unspecified Facility Procedures CPT4 Code: [id number] Description: (Facility Use Only) [id number] - APPLY MULTLAY COMPRS LWR LT LEG Modifier: Quantity: 1 Physician Procedures : CPT4 Code Description Modifier [id number] - WC PHYS LEVEL 3 - EST PT ICD-10 Diagnosis Description L97.822 Non-pressure chronic ulcer of other part  of left lower leg with fat layer exposed Quantity: 1 Electronic Signature(s) Signed: 04/10/2020 4:32:00 PM By: Baltazar Najjar MD Entered By: Baltazar Najjar on  04/10/2020 15:22:32

## 2020-04-17 ENCOUNTER — Other Ambulatory Visit: Payer: Self-pay

## 2020-04-17 ENCOUNTER — Encounter (HOSPITAL_BASED_OUTPATIENT_CLINIC_OR_DEPARTMENT_OTHER): Payer: BC Managed Care – PPO | Attending: Internal Medicine | Admitting: Physician Assistant

## 2020-04-17 DIAGNOSIS — I10 Essential (primary) hypertension: Secondary | ICD-10-CM | POA: Insufficient documentation

## 2020-04-17 DIAGNOSIS — E11622 Type 2 diabetes mellitus with other skin ulcer: Secondary | ICD-10-CM | POA: Insufficient documentation

## 2020-04-17 DIAGNOSIS — L97822 Non-pressure chronic ulcer of other part of left lower leg with fat layer exposed: Secondary | ICD-10-CM | POA: Diagnosis not present

## 2020-04-17 DIAGNOSIS — I872 Venous insufficiency (chronic) (peripheral): Secondary | ICD-10-CM | POA: Insufficient documentation

## 2020-04-17 NOTE — Progress Notes (Addendum)
EASON, HOUSMAN ([id number]) Visit Report for 04/17/2020 Chief Complaint Document Details Patient Name: Date of Service: [place] [facility] 04/17/2020 3:15 PM Medical Record Number: [id number] Patient Account Number: [id number] Date of Birth/Sex: Treating RN: 1965-07-17 (55 y.o. [name] [name] Primary Care Provider: [name] Other Clinician: Referring Provider: Treating Provider/Extender: [name] in Treatment: 3 Information Obtained from: Patient Chief Complaint Left LE Ulcers Electronic Signature(s) Signed: 04/17/2020 3:08:45 PM By: Lenda Kelp PA-C Entered By: Lenda Kelp on 04/17/2020 15:08:45 -------------------------------------------------------------------------------- Debridement Details Patient Name: Date of Service: [place], [place] 04/17/2020 3:15 PM Medical Record Number: [id number] Patient Account Number: [id number] Date of Birth/Sex: Treating RN: [birth year] (55 y.o. [name] [name] Primary Care Provider: [name] Other Clinician: Referring Provider: Treating Provider/Extender: [name] in Treatment: 3 Debridement Performed for Assessment: Wound #1 Left,Proximal,Anterior Lower Leg Performed By: Physician Lenda Kelp, PA Debridement Type: Debridement Severity of Tissue Pre Debridement: Fat layer exposed Level of Consciousness (Pre-procedure): Awake and Alert Pre-procedure Verification/Time Out Yes - 16:25 Taken: Start Time: 16:26 Pain Control: Lidocaine 4% T opical Solution T Area Debrided (L x W): otal 2 (cm) x 1.7 (cm) = 3.4 (cm) Tissue and other material debrided: Viable, Non-Viable, Slough, Subcutaneous, Slough Level: Skin/Subcutaneous Tissue Debridement Description: Excisional Instrument: Curette Bleeding: Minimum Hemostasis Achieved: Pressure End Time: 16:28 Procedural Pain: 5 Post Procedural Pain: 3 Response to Treatment: Procedure was tolerated well Level of Consciousness (Post-  Awake and Alert procedure): Post Debridement Measurements of Total Wound Length: (cm) 2 Width: (cm) 1.7 Depth: (cm) 0.2 Volume: (cm) 0.534 Character of Wound/Ulcer Post Debridement: Improved Severity of Tissue Post Debridement: Fat layer exposed Post Procedure Diagnosis Same as Pre-procedure Electronic Signature(s) Signed: 04/17/2020 4:38:50 PM By: Lenda Kelp PA-C Signed: 04/17/2020 5:05:02 PM By: Zenaida Deed RN, BSN Entered By: Zenaida Deed on 04/17/2020 16:29:10 -------------------------------------------------------------------------------- HPI Details Patient Name: Date of Service: [place], [place] 04/17/2020 3:15 PM Medical Record Number: [id number] Patient Account Number: [id number] Date of Birth/Sex: Treating RN: 1965-04-25 (55 y.o. [name] [name] Primary Care Provider: [name] Other Clinician: Referring Provider: Treating Provider/Extender: [name] in Treatment: 3 History of Present Illness HPI Description: 03/25/2020 upon evaluation today patient appears to be doing somewhat poorly in regard to his left lower extremity on the anterior portion. He has 2 wounds here that are actually giving him quite a bit of pain. They occurred due to an injury on December 12 when he struck his anterior portion of the leg with a window. He was carrying this at the time. With that being said it was a little cut he thought it would heal he put a little bit antibiotic ointment was using peroxide. With that being said he tells me that on December 19 he actually went [facility] because it did not seem to be healing appropriately they give him Keflex told him to quit using the peroxide and it he was hoping that would be the end of it. Unfortunately 2 weeks later on January 5 he ended up in the ER at [facility] where they gave him doxycycline and recommended that he come here. He does have a history of diabetes mellitus type 2 his most recent hemoglobin  A1c on October 2021 was 6.7. With that being said he tells me he does not take any medications for this currently. He does also have a history of sleep apnea though his ischium is broken  at the moment he does know he needs to call to get that fixed. He also has hypertension which has been higher due to the fact that his sleep apnea is not controlled he tells me. With regard to his wound I think the biggest issue we see here is really that he has significant swelling due to venous stasis that likely is limiting his ability to be able to heal appropriately. I think that we may need to see what we can do to improve his edema status. 2/18; this was a patient who was admitted that the clinic last week. He has a area on the left anterior lower leg x2 when he arrived here. Changes of chronic venous insufficiency. We used Iodoflex under a 2 layer compression system. He arrives back in clinic complaining of a lot of pain, pain with elevation of his leg. 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 He arrives in clinic with the more distal wound closed over although there is some callus in this area. The more proximal wound is larger still with some debris on the center. Because of his aggression of the complaints of pain I did not debride this. Change the primary dressing to [facility] question irritation from Iodoflex 2/25; left anterior lower leg. Better controlled today. We are going to try 4-layer compression on him today. Hydrofera Blue seems to be better tolerated than I had of 3//22 upon evaluation today patient appears to be doing well with regard to his wound. He has been tolerating the dressing changes. Fortunately this seems to be showing some signs of turnaround and improvement overall. There does not appear to be  any evidence of active infection at this time which is great news. Electronic Signature(s) Signed: 04/17/2020 4:37:23 PM By: Lenda Kelp PA-C Entered By: Lenda Kelp on 04/17/2020 16:37:23 -------------------------------------------------------------------------------- Physical Exam Details Patient Name: Date of Service: [place], [place] 04/17/2020 3:15 PM Medical Record Number: [id number] Patient Account Number: [id number] Date of Birth/Sex: Treating RN: [birth year] (55 y.o. [name] [name] Primary Care Provider: [name] Other Clinician: Referring Provider: Treating Provider/Extender: [name] Weeks in Treatment: 3 Constitutional Well-nourished and well-hydrated in no acute distress. Respiratory normal breathing without difficulty. Psychiatric this patient is able to make decisions and demonstrates good insight into disease process. Alert and Oriented x 3. pleasant and cooperative. Notes Patient's wound bed showed signs of some slough on the surface of the wound will been using Hydrofera Blue to help with cleaning the surface up. I think maybe adding some Santyl underneath the Hydrofera Blue could be of benefit. He is in agreement with giving this a shot. Electronic Signature(s) Signed: 04/17/2020 4:37:38 PM By: Lenda Kelp PA-C Entered By: Lenda Kelp on 04/17/2020 16:37:38 -------------------------------------------------------------------------------- Physician Orders Details Patient Name: Date of Service: [place], [place] 04/17/2020 3:15 PM Medical Record Number: [id number] Patient Account Number: [id number] Date of Birth/Sex: Treating RN: 27-Jun-1965 (55 y.o. [name] [name] Primary Care Provider: [name] Other Clinician: Referring Provider: Treating Provider/Extender: [name] in Treatment: 3 Verbal / Phone Orders: No Diagnosis Coding ICD-10 Coding Code Description I87.2 Venous insufficiency  (chronic) (peripheral) L97.822 Non-pressure chronic ulcer of other part of left lower leg with fat layer exposed I10 Essential (primary) hypertension E11.622  Type 2 diabetes mellitus with other skin ulcer G47.30 Sleep apnea, unspecified Follow-up Appointments ppointment in 2 weeks. - MD Return A Nurse Visit: - 1 week Bathing/ Shower/ Hygiene May shower with protection but do not get wound dressing(s) wet. - use cast protector to cover wrap in shower Edema Control - Lymphedema / SCD / Other Bilateral Lower Extremities Elevate legs to the level of the heart or above for 30 minutes daily and/or when sitting, a frequency of: - 3-4 times per day Avoid standing for long periods of time. Exercise regularly Wound Treatment Wound #1 - Lower Leg Wound Laterality: Left, Anterior, Proximal Peri-Wound Care: Sween Lotion (Moisturizing lotion) 1 x Per Week/7 Days Discharge Instructions: Apply moisturizing lotion as directed Prim Dressing: Hydrofera Blue Classic Foam, 2x2 in 1 x Per Week/7 Days ary Discharge Instructions: Moisten with saline prior to applying to wound bed Prim Dressing: Santyl Ointment 1 x Per Week/7 Days ary Discharge Instructions: Apply nickel thick amount to wound bed under hydrofera Secondary Dressing: Woven Gauze Sponge, Non-Sterile 4x4 in 1 x Per Week/7 Days Discharge Instructions: Apply over primary dressing as directed. Compression Wrap: FourPress (4 layer compression wrap) 1 x Per Week/7 Days Discharge Instructions: Apply four layer compression as directed. Electronic Signature(s) Signed: 04/17/2020 4:38:50 PM By: Lenda Kelp PA-C Signed: 04/17/2020 5:05:02 PM By: Zenaida Deed RN, BSN Entered By: Zenaida Deed on 04/17/2020 16:33:39 -------------------------------------------------------------------------------- Problem List Details Patient Name: Date of Service: [place] 04/17/2020 3:15 PM Medical Record Number: [id number] Patient Account Number:  [id number] Date of Birth/Sex: Treating RN: 1965-10-07 (55 y.o. [name] [name] Primary Care Provider: [name] Other Clinician: Referring Provider: Treating Provider/Extender: [name] in Treatment: 3 Active Problems ICD-10 Encounter Code Description Active Date MDM Diagnosis I87.2 Venous insufficiency (chronic) (peripheral) 03/25/2020 No Yes L97.822 Non-pressure chronic ulcer of other part of left lower leg with fat layer exposed2/10/2020 No Yes I10 Essential (primary) hypertension 03/25/2020 No Yes E11.622 Type 2 diabetes mellitus with other skin ulcer 03/25/2020 No Yes G47.30 Sleep apnea, unspecified 03/25/2020 No Yes Inactive Problems Resolved Problems Electronic Signature(s) Signed: 04/17/2020 3:08:38 PM By: Lenda Kelp PA-C Entered By: Lenda Kelp on 04/17/2020 15:08:37 -------------------------------------------------------------------------------- Progress Note Details Patient Name: Date of Service: [place] 04/17/2020 3:15 PM Medical Record Number: [id number] Patient Account Number: [id number] Date of Birth/Sex: Treating RN: November 21, 1965 (55 y.o. [name] [name] Primary Care Provider: [name] Other Clinician: Referring Provider: Treating Provider/Extender: [name] in Treatment: 3 Subjective Chief Complaint Information obtained from Patient Left LE Ulcers History of Present Illness (HPI) 03/25/2020 upon evaluation today patient appears to be doing somewhat poorly in regard to his left lower extremity on the anterior portion. He has 2 wounds here that are actually giving him quite a bit of pain. They occurred due to an injury on December 12 when he struck his anterior portion of the leg with a window. He was carrying this at the time. With that being said it was a little cut he thought it would heal he put a little bit antibiotic ointment was using peroxide. With that being said he tells me  that on December 19 [facility] because it did not seem to be healing appropriately they give him Keflex told him to quit using the peroxide and it he was hoping that would be the end of it. Unfortunately 2 weeks later on January 5 he ended up in the  ER at [facility] where they gave him doxycycline and recommended that he come here. He does have a history of diabetes mellitus type 2 his most recent hemoglobin A1c on October 2021 was 6.7. With that being said he tells me he does not take any medications for this currently. He does also have a history of sleep apnea though his ischium is broken at the moment he does know he needs to call to get that fixed. He also has hypertension which has been higher due to the fact that his sleep apnea is not controlled he tells me. With regard to his wound I think the biggest issue we see here is really that he has significant swelling due to venous stasis that likely is limiting his ability to be able to heal appropriately. I think that we may need to see what we can do to improve his edema status. 2/18; this was a patient who was admitted that the clinic last week. He has a area on the left anterior lower leg x2 when he arrived here. Changes of chronic venous insufficiency. We used Iodoflex under a 2 layer compression system. He arrives back in clinic complaining of a lot of pain, pain with elevation of his leg. 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 He arrives in clinic with the more distal wound closed over although there is some callus in this area. The more proximal wound is larger still with some debris on the center. Because of his aggression of the complaints of pain I did not debride this. Change the primary dressing to [facility] question irritation  from Iodoflex 2/25; left anterior lower leg. Better controlled today. We are going to try 4-layer compression on him today. Hydrofera Blue seems to be better tolerated than I had of 3//22 upon evaluation today patient appears to be doing well with regard to his wound. He has been tolerating the dressing changes. Fortunately this seems to be showing some signs of turnaround and improvement overall. There does not appear to be any evidence of active infection at this time which is great news. Objective Constitutional Well-nourished and well-hydrated in no acute distress. Vitals Time Taken: 3:43 PM, Height: 72 in, Weight: 360 lbs, BMI: 48.8, Temperature: 98.2 F, Pulse: 102 bpm, Respiratory Rate: 17 breaths/min, Blood Pressure: 189/100 mmHg. Respiratory normal breathing without difficulty. Psychiatric this patient is able to make decisions and demonstrates good insight into disease process. Alert and Oriented x 3. pleasant and cooperative. General Notes: Patient's wound bed showed signs of some slough on the surface of the wound will been using Hydrofera Blue to help with cleaning the surface up. I think maybe adding some Santyl underneath the Hydrofera Blue could be of benefit. He is in agreement with giving this a shot. Integumentary (Hair, Skin) Wound #1 status is Open. Original cause of wound was Trauma. The date acquired was: 01/26/2020. The wound has been in treatment 3 weeks. The wound is located on the Left,Proximal,Anterior Lower Leg. The wound measures 2cm length x 1.7cm width x 0.2cm depth; 2.67cm^2 area and 0.534cm^3 volume. There is Fat Layer (Subcutaneous Tissue) exposed. There is no tunneling or undermining noted. There is a medium amount of purulent drainage noted. The wound margin is distinct with the outline attached to the wound base. There is medium (  34-66%) red granulation within the wound bed. There is a medium (34-66%) amount of necrotic tissue within the wound bed  including Adherent Slough. Assessment Active Problems ICD-10 Venous insufficiency (chronic) (peripheral) Non-pressure chronic ulcer of other part of left lower leg with fat layer exposed Essential (primary) hypertension Type 2 diabetes mellitus with other skin ulcer Sleep apnea, unspecified Procedures Wound #1 Pre-procedure diagnosis of Wound #1 is a Venous Leg Ulcer located on the Left,Proximal,Anterior Lower Leg .Severity of Tissue Pre Debridement is: Fat layer exposed. There was a Excisional Skin/Subcutaneous Tissue Debridement with a total area of 3.4 sq cm performed by Lenda KelpStone III, Laranda Burkemper, PA. With the following instrument(s): Curette to remove Viable and Non-Viable tissue/material. Material removed includes Subcutaneous Tissue and Slough and after achieving pain control using Lidocaine 4% T opical Solution. No specimens were taken. A time out was conducted at 16:25, prior to the start of the procedure. A Minimum amount of bleeding was controlled with Pressure. The procedure was tolerated well with a pain level of 5 throughout and a pain level of 3 following the procedure. Post Debridement Measurements: 2cm length x 1.7cm width x 0.2cm depth; 0.534cm^3 volume. Character of Wound/Ulcer Post Debridement is improved. Severity of Tissue Post Debridement is: Fat layer exposed. Post procedure Diagnosis Wound #1: Same as Pre-Procedure Pre-procedure diagnosis of Wound #1 is a Venous Leg Ulcer located on the Left,Proximal,Anterior Lower Leg . There was a Four Layer Compression Therapy Procedure by Shawn Stalleaton, Bobbi, RN. Post procedure Diagnosis Wound #1: Same as Pre-Procedure Plan Follow-up Appointments: Return Appointment in 2 weeks. - MD Nurse Visit: - 1 week Bathing/ Shower/ Hygiene: May shower with protection but do not get wound dressing(s) wet. - use cast protector to cover wrap in shower Edema Control - Lymphedema / SCD / Other: Elevate legs to the level of the heart or above for 30 minutes  daily and/or when sitting, a frequency of: - 3-4 times per day Avoid standing for long periods of time. Exercise regularly WOUND #1: - Lower Leg Wound Laterality: Left, Anterior, Proximal Peri-Wound Care: Sween Lotion (Moisturizing lotion) 1 x Per Week/7 Days Discharge Instructions: Apply moisturizing lotion as directed Prim Dressing: Hydrofera Blue Classic Foam, 2x2 in 1 x Per Week/7 Days ary Discharge Instructions: Moisten with saline prior to applying to wound bed Prim Dressing: Santyl Ointment 1 x Per Week/7 Days ary Discharge Instructions: Apply nickel thick amount to wound bed under hydrofera Secondary Dressing: Woven Gauze Sponge, Non-Sterile 4x4 in 1 x Per Week/7 Days Discharge Instructions: Apply over primary dressing as directed. Com pression Wrap: FourPress (4 layer compression wrap) 1 x Per Week/7 Days Discharge Instructions: Apply four layer compression as directed. 1. I would [facility] that [facility] adding a little bit of Santyl underneath the patient is in agreement that plan. 2. I'm also can recommend that we have the patient go ahead and initiate treatment with a continuation of the compression wrap. We've been using a 4-layer compression wrap. 3. I'm also can recommend he continue to elevate his leg as much as possible try to keep edema under good control. We will see patient back for reevaluation in 1 week here in the clinic. If anything worsens or changes patient will contact our office for additional recommendations. This will be a nurse visit we'll see him in 2 weeks for doctor visit. Electronic Signature(s) Signed: 04/17/2020 4:38:08 PM By: Lenda KelpStone III, Aoi Kouns PA-C Entered By: Lenda KelpStone III, Terisha Losasso on 04/17/2020 16:38:08 -------------------------------------------------------------------------------- SuperBill Details Patient Name: Date  of Service: [place], [place] 04/17/2020 Medical Record Number: [id number] Patient Account Number: [id number] Date of  Birth/Sex: Treating RN: 07-Mar-1965 (55 y.o. [name] [name] Primary Care Provider: [name] Other Clinician: Referring Provider: Treating Provider/Extender: [name] in Treatment: 3 Diagnosis Coding ICD-10 Codes Code Description I87.2 Venous insufficiency (chronic) (peripheral) L97.822 Non-pressure chronic ulcer of other part of left lower leg with fat layer exposed I10 Essential (primary) hypertension E11.622 Type 2 diabetes mellitus with other skin ulcer G47.30 Sleep apnea, unspecified Facility Procedures CPT4 Code: [id number] Description: 11042 - DEB SUBQ TISSUE 20 SQ CM/< ICD-10 Diagnosis Description L97.822 Non-pressure chronic ulcer of other part of left lower leg with fat layer expo Modifier: sed Quantity: 1 Physician Procedures : CPT4 Code Description Modifier [id number] 11042 - WC PHYS SUBQ TISS 20 SQ CM ICD-10 Diagnosis Description L97.822 Non-pressure chronic ulcer of other part of left lower leg with fat layer exposed Quantity: 1 Electronic Signature(s) Signed: 04/17/2020 4:38:19 PM By: Lenda Kelp PA-C Entered By: Lenda Kelp on 04/17/2020 16:38:19

## 2020-04-21 NOTE — Progress Notes (Signed)
[name], [name] ([id number]) Visit Report for 04/17/2020 Arrival Information Details Patient Name: Date of Service: [place], [place] [facility] 04/17/2020 3:15 PM Medical Record Number: [id number] Patient Account Number: [id number] Date of Birth/Sex: Treating RN: 14-Feb-1966 (55 y.o. [name] [name], [name] [name] Primary Care [name]: [name] [name] Other Clinician: Referring [name] [name]: Treating [name] [name]/Extender: [name] [name] Payer in Treatment: 3 Visit Information History Since Last Visit Added or deleted any medications: No Patient Arrived: Ambulatory Any new allergies or adverse reactions: No Arrival Time: 15:42 Had a fall or experienced change in No Accompanied By: self activities of daily living that may affect Transfer Assistance: None risk of falls: Patient Identification Verified: Yes Signs or symptoms of abuse/neglect since last visito No Secondary Verification Process Completed: Yes Hospitalized since last visit: No Patient Requires Transmission-Based Precautions: No Implantable device outside of the clinic excluding No Patient Has Alerts: Yes cellular tissue based products placed in the center Patient Alerts: L ABI non compressible since last visit: Has Dressing in Place as Prescribed: Yes Pain Present Now: No Electronic Signature(s) Signed: 04/21/2020 11:11:03 AM By: Karl Ito Entered By: Karl Ito on 04/17/2020 15:42:25 -------------------------------------------------------------------------------- Compression Therapy Details Patient Name: Date of Service: [place], [place] 04/17/2020 3:15 PM Medical Record Number: [id number] Patient Account Number: [id number] Date of Birth/Sex: Treating RN: May 20, 1965 (55 y.o. [name] [name] Primary Care [name] [name]: [name] [name] Other Clinician: Referring [name] [name]: Treating [name] [name]/Extender: [name] [name] Payer in Treatment: 3 Compression Therapy Performed for Wound Assessment: Wound #1  Left,Proximal,Anterior Lower Leg Performed By: Clinician Shawn Stall, RN Compression Type: Four Layer Post Procedure Diagnosis Same as Pre-procedure Electronic Signature(s) Signed: 04/17/2020 5:05:02 PM By: Zenaida Deed RN, BSN Entered By: Zenaida Deed on 04/17/2020 16:27:56 -------------------------------------------------------------------------------- Encounter Discharge Information Details Patient Name: Date of Service: [place], [place] 04/17/2020 3:15 PM Medical Record Number: [id number] Patient Account Number: [id number] Date of Birth/Sex: Treating RN: [birth year] (55 y.o. [name] Primary Care [name] [name]: [name] [name] Other Clinician: Referring [name] [name]: Treating [name] [name]/Extender: [name] [name] Payer in Treatment: 3 Encounter Discharge Information Items Post Procedure Vitals Discharge Condition: Stable Temperature (F): 98.2 Ambulatory Status: Ambulatory Pulse (bpm): 102 Discharge Destination: Home Respiratory Rate (breaths/min): 17 Transportation: Private Auto Blood Pressure (mmHg): 189/100 Accompanied By: self Schedule Follow-up Appointment: Yes Clinical Summary of Care: Electronic Signature(s) Signed: 04/17/2020 5:05:25 PM By: Shawn Stall Entered By: Shawn Stall on 04/17/2020 16:57:43 -------------------------------------------------------------------------------- Lower Extremity Assessment Details Patient Name: Date of Service: [place] [facility] 04/17/2020 3:15 PM Medical Record Number: [id number] Patient Account Number: [id number] Date of Birth/Sex: Treating RN: 01-10-1966 (55 y.o. [name] [name] Primary Care [name] [name]: [name] [name] Other Clinician: Referring [name] [name]: Treating [name] [name]/Extender: [name] [name] Weeks in Treatment: 3 Edema Assessment Assessed: [Left: No] [Right: No] Edema: [Left: Ye] [Right: s] Calf Left: Right: Point of Measurement: 40 cm From Medial Instep 47.5 cm Ankle Left:  Right: Point of Measurement: 10 cm From Medial Instep 28.2 cm Vascular Assessment Pulses: Dorsalis Pedis Palpable: [Left:Yes] Electronic Signature(s) Signed: 04/17/2020 3:56:44 PM By: [name] [name] Signed: 04/17/2020 5:05:02 PM By: Zenaida Deed RN, BSN Entered By: [name] [name] on 04/17/2020 15:50:38 -------------------------------------------------------------------------------- Multi-Disciplinary Care Plan Details Patient Name: Date of Service: [place], [place] 04/17/2020 3:15 PM Medical Record Number: [id number] Patient Account Number: [id number] Date of Birth/Sex: Treating RN: Sep 02, 1965 (55 y.o. [name] [name] Primary Care [name] [name]: [name] [name] Other Clinician: Referring [name] [name]: Treating [name] [name]/Extender: [name] [name] Payer in Treatment: 3 Multidisciplinary Care Plan reviewed with physician  Active Inactive Nutrition Nursing Diagnoses: Impaired glucose control: actual or potential Potential for alteratiion in Nutrition/Potential for imbalanced nutrition Goals: Patient/caregiver will maintain therapeutic glucose control Date Initiated: 03/25/2020 Target Resolution Date: 04/22/2020 Goal Status: Active Interventions: Assess HgA1c results as ordered upon admission and as needed Assess patient nutrition upon admission and as needed per policy Provide education on elevated blood sugars and impact on wound healing Treatment Activities: Patient referred to Primary Care Physician for further nutritional evaluation : 03/25/2020 Notes: Venous Leg Ulcer Nursing Diagnoses: Knowledge deficit related to disease process and management Potential for venous Insuffiency (use before diagnosis confirmed) Goals: Patient will maintain optimal edema control Date Initiated: 03/25/2020 Target Resolution Date: 04/22/2020 Goal Status: Active Interventions: Assess peripheral edema status every visit. Compression as ordered Treatment Activities: Therapeutic compression  applied : 03/25/2020 Notes: Wound/Skin Impairment Nursing Diagnoses: Impaired tissue integrity Knowledge deficit related to smoking impact on wound healing Knowledge deficit related to ulceration/compromised skin integrity Goals: Patient/caregiver will verbalize understanding of skin care regimen Date Initiated: 03/25/2020 Target Resolution Date: 04/22/2020 Goal Status: Active Ulcer/skin breakdown will have a volume reduction of 30% by week 4 Date Initiated: 03/25/2020 Target Resolution Date: 04/22/2020 Goal Status: Active Interventions: Assess patient/caregiver ability to obtain necessary supplies Assess patient/caregiver ability to perform ulcer/skin care regimen upon admission and as needed Assess ulceration(s) every visit Provide education on ulcer and skin care Treatment Activities: Skin care regimen initiated : 03/25/2020 Topical wound management initiated : 03/25/2020 Notes: Electronic Signature(s) Signed: 04/17/2020 5:05:02 PM By: Zenaida Deed RN, BSN Entered By: Zenaida Deed on 04/17/2020 16:25:56 -------------------------------------------------------------------------------- Pain Assessment Details Patient Name: Date of Service: [place], [place] 04/17/2020 3:15 PM Medical Record Number: [id number] Patient Account Number: [id number] Date of Birth/Sex: Treating RN: 16-Jan-1966 (55 y.o. [name] [name] Primary Care Shundra Wirsing: [name] [name] Other Clinician: Referring Tarquin Welcher: Treating Kaide Gage/Extender: [name] [name] Payer in Treatment: 3 Active Problems Location of Pain Severity and Description of Pain Patient Has Paino No Site Locations Pain Management and Medication Current Pain Management: Electronic Signature(s) Signed: 04/17/2020 5:05:02 PM By: Zenaida Deed RN, BSN Signed: 04/21/2020 11:11:03 AM By: Karl Ito Entered By: Karl Ito on 04/17/2020  15:44:17 -------------------------------------------------------------------------------- Patient/Caregiver Education Details Patient Name: Date of Service: [place] 3/4/2022andnbsp3:15 PM Medical Record Number: [id number] Patient Account Number: [id number] Date of Birth/Gender: Treating RN: 10/22/1965 (55 y.o. [name] [name] Primary Care Physician: [name] [name] Other Clinician: Referring Physician: Treating Physician/Extender: [name] [name] Payer in Treatment: 3 Education Assessment Education Provided To: Patient Education Topics Provided Venous: Methods: Explain/Verbal Responses: Reinforcements needed, State content correctly Wound/Skin Impairment: Methods: Explain/Verbal Responses: Reinforcements needed, State content correctly Electronic Signature(s) Signed: 04/17/2020 5:05:02 PM By: Zenaida Deed RN, BSN Entered By: Zenaida Deed on 04/17/2020 16:26:40 -------------------------------------------------------------------------------- Wound Assessment Details Patient Name: Date of Service: [place], [place] 04/17/2020 3:15 PM Medical Record Number: [id number] Patient Account Number: [id number] Date of Birth/Sex: Treating RN: 1965-04-10 (55 y.o. [name] [name] Primary Care Kabir Brannock: [name] [name] Other Clinician: Referring Belen Pesch: Treating Timera Windt/Extender: [name] [name] Payer in Treatment: 3 Wound Status Wound Number: 1 Primary Etiology: Venous Leg Ulcer Wound Location: Left, Proximal, Anterior Lower Leg Wound Status: Open Wounding Event: Trauma Comorbid History: Sleep Apnea, Hypertension, Type II Diabetes, Gout Date Acquired: 01/26/2020 Weeks Of Treatment: 3 Clustered Wound: No Photos Wound Measurements Length: (cm) 2 Width: (cm) 1.7 Depth: (cm) 0.2 Area: (cm) 2.67 Volume: (cm) 0.534 % Reduction in Area: -33.3% % Reduction in Volume: -167% Epithelialization: Small (  1-33%) Tunneling: No Undermining:  No Wound Description Classification: Full Thickness Without Exposed Support Structu Wound Margin: Distinct, outline attached Exudate Amount: Medium Exudate Type: Purulent Exudate Color: yellow, brown, green Wound Bed Granulation Amount: Medium (34-66%) Granulation Quality: Red Necrotic Amount: Medium (34-66%) Necrotic Quality: Adherent Slough res [employer] Odor After Cleansing: No Slough/Fibrino Yes Exposed Structure Fascia Exposed: No Fat Layer (Subcutaneous Tissue) Exposed: Yes Tendon Exposed: No Muscle Exposed: No Joint Exposed: No Bone Exposed: No Treatment Notes Wound #1 (Lower Leg) Wound Laterality: Left, Anterior, Proximal Cleanser Peri-Wound Care Sween Lotion (Moisturizing lotion) Discharge Instruction: Apply moisturizing lotion as directed Topical Primary Dressing Hydrofera Blue Classic Foam, 2x2 in Discharge Instruction: Moisten with saline prior to applying to wound bed Santyl Ointment Discharge Instruction: Apply nickel thick amount to wound bed under hydrofera Secondary Dressing Woven Gauze Sponge, Non-Sterile 4x4 in Discharge Instruction: Apply over primary dressing as directed. Secured With Compression Wrap FourPress (4 layer compression wrap) Discharge Instruction: Apply four layer compression as directed. Compression Stockings Add-Ons Electronic Signature(s) Signed: 04/21/2020 11:11:03 AM By: Karl Ito Signed: 04/21/2020 6:01:57 PM By: Zenaida Deed RN, BSN Previous Signature: 04/17/2020 3:56:44 PM Version By: [name] [name] Previous Signature: 04/17/2020 5:05:02 PM Version By: Zenaida Deed RN, BSN Entered By: Karl Ito on 04/21/2020 10:34:58 -------------------------------------------------------------------------------- Vitals Details Patient Name: Date of Service: [place] 04/17/2020 3:15 PM Medical Record Number: [id number] Patient Account Number: [id number] Date of Birth/Sex: Treating RN: 1965/04/05 (55 y.o. [name] [name] Primary Care Lorae Roig: [name] [name] Other Clinician: Referring Machel Violante: Treating Khyren Hing/Extender: [name] [name] Payer in Treatment: 3 Vital Signs Time Taken: 15:43 Temperature (F): 98.2 Height (in): 72 Pulse (bpm): 102 Weight (lbs): 360 Respiratory Rate (breaths/min): 17 Body Mass Index (BMI): 48.8 Blood Pressure (mmHg): 189/100 Reference Range: 80 - 120 mg / dl Electronic Signature(s) Signed: 04/21/2020 11:11:03 AM By: Karl Ito Entered By: Karl Ito on 04/17/2020 15:43:58

## 2020-04-24 ENCOUNTER — Encounter (HOSPITAL_BASED_OUTPATIENT_CLINIC_OR_DEPARTMENT_OTHER): Payer: BC Managed Care – PPO | Admitting: Physician Assistant

## 2020-05-01 ENCOUNTER — Encounter (HOSPITAL_BASED_OUTPATIENT_CLINIC_OR_DEPARTMENT_OTHER): Payer: BC Managed Care – PPO | Admitting: Internal Medicine

## 2020-05-01 ENCOUNTER — Other Ambulatory Visit: Payer: Self-pay

## 2020-05-01 DIAGNOSIS — I872 Venous insufficiency (chronic) (peripheral): Secondary | ICD-10-CM | POA: Diagnosis not present

## 2020-05-01 NOTE — Progress Notes (Signed)
WORTHY, BOSCHERT ([id number]) Visit Report for 05/01/2020 HPI Details Patient Name: Date of Service: [place], [place] 05/01/2020 2:15 PM Medical Record Number: [id number] Patient Account Number: [id number] Date of Birth/Sex: Treating RN: [birth year] (55 y.o. [name] Primary Care Provider: [name] Other Clinician: Referring Provider: Treating Provider/Extender: Tenny Craw in Treatment: 5 History of Present Illness HPI Description: 03/25/2020 upon evaluation today patient appears to be doing somewhat poorly in regard to his left lower extremity on the anterior portion. He has 2 wounds here that are actually giving him quite a bit of pain. They occurred due to an injury on December 12 when he struck his anterior portion of the leg with a window. He was carrying this at the time. With that being said it was a little cut he thought it would heal he put a little bit antibiotic ointment was using peroxide. With that being said he tells me that on December 19 he actually went [facility] because it did not seem to be healing appropriately they give him Keflex told him to quit using the peroxide and it he was hoping that would be the end of it. Unfortunately 2 weeks later on January 5 he ended up in the ER at [facility] where they gave him doxycycline and recommended that he come here. He does have a history of diabetes mellitus type 2 his most recent hemoglobin A1c on October 2021 was 6.7. With that being said he tells me he does not take any medications for this currently. He does also have a history of sleep apnea though his ischium is broken at the moment he does know he needs to call to get that fixed. He also has hypertension which has been higher due to the fact that his sleep apnea is not controlled he tells me. With regard to his wound I think the biggest issue we see here is really that he has significant swelling due to venous stasis that likely is limiting his  ability to be able to heal appropriately. I think that we may need to see what we can do to improve his edema status. 2/18; this was a patient who was admitted that the clinic last week. He has a area on the left anterior lower leg x2 when he arrived here. Changes of chronic venous insufficiency. We used Iodoflex under a 2 layer compression system. He arrives back in clinic complaining of a lot of pain, pain with elevation of his leg. 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 He arrives in clinic with the more distal wound closed over although there is some callus in this area. The more proximal wound is larger still with some debris on the center. Because of his aggression of the complaints of pain I did not debride this. Change the primary dressing to [facility] question irritation from Iodoflex 2/25; left anterior lower leg. Better controlled today. We are going to try 4-layer compression on him today. Hydrofera Blue seems to be better tolerated than I had of 3//22 upon evaluation today patient appears to be doing well with regard to his wound. He has been tolerating the dressing changes. Fortunately this seems to be showing some signs of turnaround and improvement overall. There does not appear to be any evidence of  active infection at this time which is great news. 3/18; left anterior lower leg. Intake reported a large amount of purulent drainage although the wound certainly did not look infected there was maceration. The Hydrofera Blue did not seem able to absorb this and there is periwound maceration Electronic Signature(s) Signed: 05/01/2020 5:26:06 PM By: Baltazar Najjarobson, Garo Heidelberg MD Entered By: Baltazar Najjarobson, Maurice Ramseur on 05/01/2020  15:09:05 -------------------------------------------------------------------------------- Physical Exam Details Patient Name: Date of Service: [place] 05/01/2020 2:15 PM Medical Record Number: [id number] Patient Account Number: [id number] Date of Birth/Sex: Treating RN: 1965-04-21 (55 y.o. Damaris SchoonerM) Boehlein, Linda Primary Care Provider: Azzie RoupAnderson, Shane Other Clinician: Referring Provider: Treating Provider/Extender: Tenny Crawobson, Tonique Mendonca Anderson, Shane Weeks in Treatment: 5 Constitutional Patient is hypertensive.. Pulse regular and within target range for patient.Marland Kitchen. Respirations regular, non-labored and within target range.. Temperature is normal and within the target range for the patient.Marland Kitchen. Appears in no distress. Cardiovascular Needle pulses palpable. Edema control is quite good. Notes Wound exam; really no slough and nothing that really looks like it needed debridement. There was no depth. Nothing really looked like surrounding cellulitis. Electronic Signature(s) Signed: 05/01/2020 5:26:06 PM By: Baltazar Najjarobson, Macy Lingenfelter MD Entered By: Baltazar Najjarobson, Tyanna Hach on 05/01/2020 15:09:57 -------------------------------------------------------------------------------- Physician Orders Details Patient Name: [place] 05/01/2020 2:15 PM Medical Record Number: [id number] Patient Account Number: [id number] Date of Birth/Sex: Treating RN: 1965-04-21 (55 y.o. Lytle MichaelsM) Barnhart, Jodi Primary Care Provider: Azzie RoupAnderson, Shane Other Clinician: Referring Provider: Treating Provider/Extender: Tenny Crawobson, Shean Gerding Anderson, Shane Weeks in Treatment: 5 Verbal / Phone Orders: No Diagnosis Coding ICD-10 Coding Code Description I87.2 Venous insufficiency (chronic) (peripheral) L97.822 Non-pressure chronic ulcer of other part of left lower leg with fat layer exposed I10 Essential (primary) hypertension E11.622 Type 2 diabetes mellitus with other skin ulcer G47.30 Sleep apnea, unspecified Follow-up  Appointments Return Appointment in 1 week. Bathing/ Shower/ Hygiene May shower with protection but do not get wound dressing(s) wet. - use cast protector to cover wrap in shower Edema Control - Lymphedema / SCD / Other Bilateral Lower Extremities Elevate legs to the level of the heart or above for 30 minutes daily and/or when sitting, a frequency of: - 3-4 times per day Avoid standing for long periods of time. Exercise regularly Additional Orders / Instructions Follow Nutritious Diet Wound Treatment Wound #1 - Lower Leg Wound Laterality: Left, Anterior, Proximal Peri-Wound Care: Zinc Oxide Ointment 30g tube 1 x Per Week/7 Days Discharge Instructions: Apply Zinc Oxide to macerated periwound with each dressing change Peri-Wound Care: Sween Lotion (Moisturizing lotion) 1 x Per Week/7 Days Discharge Instructions: Apply moisturizing lotion as directed Prim Dressing: KerraCel Ag Gelling Fiber Dressing, 2x2 in (silver alginate) 1 x Per Week/7 Days ary Discharge Instructions: Apply silver alginate to wound bed as instructed Secondary Dressing: Woven Gauze Sponge, Non-Sterile 4x4 in 1 x Per Week/7 Days Discharge Instructions: Apply over primary dressing as directed. Compression Wrap: FourPress (4 layer compression wrap) 1 x Per Week/7 Days Discharge Instructions: Apply four layer compression as directed. Patient Medications llergies: prednisone A Notifications Medication Indication Start End 05/01/2020 doxycycline monohydrate DOSE oral 100 mg capsule - 1capsule oral bid for 7 days Electronic Signature(s) Signed: 05/01/2020 3:13:07 PM By: Baltazar Najjarobson, Jaiceon Collister MD Previous Signature: 05/01/2020 2:14:48 PM Version By: Antonieta IbaBarnhart, Jodi Entered By: Baltazar Najjarobson, Alfredo Collymore on 05/01/2020 15:13:06 -------------------------------------------------------------------------------- Problem List Details Patient Name: [place] 05/01/2020 2:15 PM Medical Record Number: [id number] Patient Account  Number: [id number] Date of Birth/Sex: Treating RN: 1965-04-21 (55 y.o. Lytle MichaelsM) Barnhart, Jodi Primary  Care Provider: [name] Other Clinician: Referring Provider: Treating Provider/Extender: Tenny Craw in Treatment: 5 Active Problems ICD-10 Encounter Code Description Active Date MDM Diagnosis I87.2 Venous insufficiency (chronic) (peripheral) 03/25/2020 No Yes L97.822 Non-pressure chronic ulcer of other part of left lower leg with fat layer exposed2/10/2020 No Yes I10 Essential (primary) hypertension 03/25/2020 No Yes E11.622 Type 2 diabetes mellitus with other skin ulcer 03/25/2020 No Yes G47.30 Sleep apnea, unspecified 03/25/2020 No Yes Inactive Problems Resolved Problems Electronic Signature(s) Signed: 05/01/2020 5:26:06 PM By: Baltazar Najjar MD Previous Signature: 05/01/2020 2:13:50 PM Version By: Antonieta Iba Entered By: Baltazar Najjar on 05/01/2020 15:05:25 -------------------------------------------------------------------------------- Progress Note Details Patient Name: Date of Service: [place], [place] 05/01/2020 2:15 PM Medical Record Number: [id number] Patient Account Number: [id number] Date of Birth/Sex: Treating RN: [birth year] (55 y.o. [name] Primary Care Provider: [name] Other Clinician: Referring Provider: Treating Provider/Extender: Tenny Craw in Treatment: 5 Subjective History of Present Illness (HPI) 03/25/2020 upon evaluation today patient appears to be doing somewhat poorly in regard to his left lower extremity on the anterior portion. He has 2 wounds here that are actually giving him quite a bit of pain. They occurred due to an injury on December 12 when he struck his anterior portion of the leg with a window. He was carrying this at the time. With that being said it was a little cut he thought it would heal he put a little bit antibiotic ointment was using peroxide. With that being said he  tells me that on December 19 [facility] because it did not seem to be healing appropriately they give him Keflex told him to quit using the peroxide and it he was hoping that would be the end of it. Unfortunately 2 weeks later on January 5 he ended up in the ER at [facility] where they gave him doxycycline and recommended that he come here. He does have a history of diabetes mellitus type 2 his most recent hemoglobin A1c on October 2021 was 6.7. With that being said he tells me he does not take any medications for this currently. He does also have a history of sleep apnea though his ischium is broken at the moment he does know he needs to call to get that fixed. He also has hypertension which has been higher due to the fact that his sleep apnea is not controlled he tells me. With regard to his wound I think the biggest issue we see here is really that he has significant swelling due to venous stasis that likely is limiting his ability to be able to heal appropriately. I think that we may need to see what we can do to improve his edema status. 2/18; this was a patient who was admitted that the clinic last week. He has a area on the left anterior lower leg x2 when he arrived here. Changes of chronic venous insufficiency. We used Iodoflex under a 2 layer compression system. He arrives back in clinic complaining of a lot of pain, pain with elevation of his leg. 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 He arrives in clinic with the more distal wound closed over although there is some callus in this area. The more proximal wound is larger still  with some debris on the center. Because of his aggression of the complaints of pain I did not debride this. Change the primary dressing to [facility] question  irritation from Iodoflex 2/25; left anterior lower leg. Better controlled today. We are going to try 4-layer compression on him today. Hydrofera Blue seems to be better tolerated than I had of 3//22 upon evaluation today patient appears to be doing well with regard to his wound. He has been tolerating the dressing changes. Fortunately this seems to be showing some signs of turnaround and improvement overall. There does not appear to be any evidence of active infection at this time which is great news. 3/18; left anterior lower leg. Intake reported a large amount of purulent drainage although the wound certainly did not look infected there was maceration. The Hydrofera Blue did not seem able to absorb this and there is periwound maceration Objective Constitutional Patient is hypertensive.. Pulse regular and within target range for patient.Marland Kitchen Respirations regular, non-labored and within target range.. Temperature is normal and within the target range for the patient.Marland Kitchen Appears in no distress. Vitals Time Taken: 2:23 PM, Height: 72 in, Source: Stated, Weight: 360 lbs, Source: Stated, BMI: 48.8, Temperature: 97.9 F, Pulse: 96 bpm, Respiratory Rate: 20 breaths/min, Blood Pressure: 185/101 mmHg. Cardiovascular Needle pulses palpable. Edema control is quite good. General Notes: Wound exam; really no slough and nothing that really looks like it needed debridement. There was no depth. Nothing really looked like surrounding cellulitis. Integumentary (Hair, Skin) Wound #1 status is Open. Original cause of wound was Trauma. The date acquired was: 01/26/2020. The wound has been in treatment 5 weeks. The wound is located on the Left,Proximal,Anterior Lower Leg. The wound measures 1.8cm length x 1.3cm width x 0.1cm depth; 1.838cm^2 area and 0.184cm^3 volume. There is Fat Layer (Subcutaneous Tissue) exposed. There is no tunneling or undermining noted. There is a medium amount of purulent drainage noted.  The wound margin is distinct with the outline attached to the wound base. There is large (67-100%) red granulation within the wound bed. There is a small (1-33%) amount of necrotic tissue within the wound bed including Adherent Slough. Assessment Active Problems ICD-10 Venous insufficiency (chronic) (peripheral) Non-pressure chronic ulcer of other part of left lower leg with fat layer exposed Essential (primary) hypertension Type 2 diabetes mellitus with other skin ulcer Sleep apnea, unspecified Procedures Wound #1 Pre-procedure diagnosis of Wound #1 is a Venous Leg Ulcer located on the Left,Proximal,Anterior Lower Leg . There was a Four Layer Compression Therapy Procedure by Antonieta Iba, RN. Post procedure Diagnosis Wound #1: Same as Pre-Procedure Plan Follow-up Appointments: Return Appointment in 1 week. Bathing/ Shower/ Hygiene: May shower with protection but do not get wound dressing(s) wet. - use cast protector to cover wrap in shower Edema Control - Lymphedema / SCD / Other: Elevate legs to the level of the heart or above for 30 minutes daily and/or when sitting, a frequency of: - 3-4 times per day Avoid standing for long periods of time. Exercise regularly Additional Orders / Instructions: Follow Nutritious Diet WOUND #1: - Lower Leg Wound Laterality: Left, Anterior, Proximal Peri-Wound Care: Zinc Oxide Ointment 30g tube 1 x Per Week/7 Days Discharge Instructions: Apply Zinc Oxide to macerated periwound with each dressing change Peri-Wound Care: Sween Lotion (Moisturizing lotion) 1 x Per Week/7 Days Discharge Instructions: Apply moisturizing lotion as directed Prim Dressing: KerraCel Ag Gelling Fiber Dressing, 2x2 in (silver alginate) 1 x Per Week/7 Days ary Discharge Instructions: Apply silver alginate to  wound bed as instructed Secondary Dressing: Woven Gauze Sponge, Non-Sterile 4x4 in 1 x Per Week/7 Days Discharge Instructions: Apply over primary dressing as  directed. Com pression Wrap: FourPress (4 layer compression wrap) 1 x Per Week/7 Days Discharge Instructions: Apply four layer compression as directed. 1. I change the dressing to silver alginate 2. I am going to put him on empiric doxycycline for a week. If this does not help may need need to consider a deep tissue culture 3. The patient was concerned about the possibility of amputation I reassured him that this would be very unlikely in the setting of a chronic venous wound Electronic Signature(s) Signed: 05/01/2020 5:26:06 PM By: Baltazar Najjar MD Entered By: Baltazar Najjar on 05/01/2020 15:10:42 -------------------------------------------------------------------------------- SuperBill Details Patient Name: Date of Service: [place], [place] 05/01/2020 Medical Record Number: [id number] Patient Account Number: [id number] Date of Birth/Sex: Treating RN: 08/08/1965 (55 y.o. [name] Primary Care Provider: [name] Other Clinician: Referring Provider: Treating Provider/Extender: Tenny Craw in Treatment: 5 Diagnosis Coding ICD-10 Codes Code Description I87.2 Venous insufficiency (chronic) (peripheral) L97.822 Non-pressure chronic ulcer of other part of left lower leg with fat layer exposed I10 Essential (primary) hypertension E11.622 Type 2 diabetes mellitus with other skin ulcer G47.30 Sleep apnea, unspecified Facility Procedures CPT4 Code: [id number] Description: (Facility Use Only) [id number] - APPLY MULTLAY COMPRS LWR LT LEG ICD-10 Diagnosis Description L97.822 Non-pressure chronic ulcer of other part of left lower leg with fat layer exposed I87.2 Venous insufficiency (chronic) (peripheral) Modifier: Quantity: 1 Electronic Signature(s) Signed: 05/01/2020 5:26:06 PM By: Baltazar Najjar MD Signed: 05/01/2020 5:48:33 PM By: Antonieta Iba Entered By: Antonieta Iba on 05/01/2020 14:40:32

## 2020-05-01 NOTE — Progress Notes (Addendum)
MUZAMMIL, BRUINS ([id number]) Visit Report for 05/01/2020 Arrival Information Details Patient Name: Date of Service: [place], [place] 05/01/2020 2:15 PM Medical Record Number: [id number] Patient Account Number: [id number] Date of Birth/Sex: Treating RN: 09/05/1965 (55 y.o. [name] Primary Care Hakim Minniefield: [name] Other Clinician: Referring Arliene Rosenow: Treating Shinika Estelle/Extender: Tenny Craw in Treatment: 5 Visit Information History Since Last Visit Added or deleted any medications: Yes Patient Arrived: Ambulatory Any new allergies or adverse reactions: No Arrival Time: 14:18 Had a fall or experienced change in No Accompanied By: self activities of daily living that may affect Transfer Assistance: None risk of falls: Patient Identification Verified: Yes Signs or symptoms of abuse/neglect since last visito No Secondary Verification Process Completed: Yes Hospitalized since last visit: No Patient Requires Transmission-Based Precautions: No Implantable device outside of the clinic excluding No Patient Has Alerts: Yes cellular tissue based products placed in the center Patient Alerts: L ABI non compressible since last visit: Has Dressing in Place as Prescribed: Yes Has Compression in Place as Prescribed: Yes Pain Present Now: Yes Electronic Signature(s) Signed: 05/01/2020 5:12:39 PM By: Zenaida Deed RN, BSN Entered By: Zenaida Deed on 05/01/2020 14:20:10 -------------------------------------------------------------------------------- Compression Therapy Details Patient Name: Date of Service: [place], [place] 05/01/2020 2:15 PM Medical Record Number: [id number] Patient Account Number: [id number] Date of Birth/Sex: Treating RN: [birth year] (55 y.o. [name] Primary Care Kawon Willcutt: [name] Other Clinician: Referring Alejandrina Raimer: Treating Paralee Pendergrass/Extender: Tenny Craw in Treatment: 5 Compression Therapy  Performed for Wound Assessment: Wound #1 Left,Proximal,Anterior Lower Leg Performed By: Clinician Antonieta Iba, RN Compression Type: Four Layer Post Procedure Diagnosis Same as Pre-procedure Electronic Signature(s) Signed: 05/01/2020 5:48:33 PM By: Antonieta Iba Entered By: Antonieta Iba on 05/01/2020 14:40:08 -------------------------------------------------------------------------------- Encounter Discharge Information Details Patient Name: Date of Service: [place], [place] 05/01/2020 2:15 PM Medical Record Number: [id number] Patient Account Number: [id number] Date of Birth/Sex: Treating RN: 1965/03/16 (55 y.o. [name] Primary Care Gonsalo Cuthbertson: [name] Other Clinician: Referring Kassandra Meriweather: Treating Jendaya Gossett/Extender: Tenny Craw in Treatment: 5 Encounter Discharge Information Items Discharge Condition: Stable Ambulatory Status: Ambulatory Discharge Destination: Home Transportation: Private Auto Accompanied By: self Schedule Follow-up Appointment: Yes Clinical Summary of Care: Electronic Signature(s) Signed: 05/01/2020 5:28:19 PM By: Shawn Stall Entered By: Shawn Stall on 05/01/2020 14:58:48 -------------------------------------------------------------------------------- Lower Extremity Assessment Details Patient Name: Date of Service: [place], [place] 05/01/2020 2:15 PM Medical Record Number: [id number] Patient Account Number: [id number] Date of Birth/Sex: Treating RN: 1965/12/16 (56 y.o. [name] Primary Care Lulie Hurd: [name] Other Clinician: Referring Jonanthan Bolender: Treating Rafi Kenneth/Extender: Tenny Craw in Treatment: 5 Edema Assessment Assessed: [Left: No] [Right: No] Edema: [Left: Ye] [Right: s] Calf Left: Right: Point of Measurement: 40 cm From Medial Instep 45 cm Ankle Left: Right: Point of Measurement: 10 cm From Medial Instep 29 cm Vascular Assessment Pulses: Dorsalis  Pedis Palpable: [Left:Yes] Electronic Signature(s) Signed: 05/01/2020 5:12:39 PM By: Zenaida Deed RN, BSN Entered By: Zenaida Deed on 05/01/2020 14:28:35 -------------------------------------------------------------------------------- Multi Wound Chart Details Patient Name: Date of Service: [place], [place] 05/01/2020 2:15 PM Medical Record Number: [id number] Patient Account Number: [id number] Date of Birth/Sex: Treating RN: Feb 24, 1965 (56 y.o. [name] Primary Care Rylend Pietrzak: [name] Other Clinician: Referring Schuyler Olden: Treating Siah Steely/Extender: Tenny Craw in Treatment: 5 Vital Signs Height(in): 72 Pulse(bpm): 96 Weight(lbs): 360 Blood Pressure(mmHg): 185/101 Body Mass Index(BMI): 49 Temperature(F): 97.9 Respiratory Rate(breaths/min): 20 Photos: [1:No Photos Left, Proximal, Anterior Lower Leg] [  N/A:N/A N/A] Wound Location: [1:Trauma] [N/A:N/A] Wounding Event: [1:Venous Leg Ulcer] [N/A:N/A] Primary Etiology: [1:Sleep Apnea, Hypertension, Type II N/A] Comorbid History: [1:Diabetes, Gout 01/26/2020] [N/A:N/A] Date Acquired: [1:5] [N/A:N/A] Weeks of Treatment: [1:Open] [N/A:N/A] Wound Status: [1:1.8x1.3x0.1] [N/A:N/A] Measurements L x W x D (cm) [1:1.838] [N/A:N/A] A (cm) : rea [1:0.184] [N/A:N/A] Volume (cm) : [1:8.20%] [N/A:N/A] % Reduction in Area: [1:8.00%] [N/A:N/A] % Reduction in Volume: [1:Full Thickness Without Exposed] [N/A:N/A] Classification: [1:Support Structures Medium] [N/A:N/A] Exudate Amount: [1:Purulent] [N/A:N/A] Exudate Type: [1:yellow, brown, green] [N/A:N/A] Exudate Color: [1:Distinct, outline attached] [N/A:N/A] Wound Margin: [1:Large (67-100%)] [N/A:N/A] Granulation Amount: [1:Red] [N/A:N/A] Granulation Quality: [1:Small (1-33%)] [N/A:N/A] Necrotic Amount: [1:Fat Layer (Subcutaneous Tissue): Yes N/A] Exposed Structures: [1:Fascia: No Tendon: No Muscle: No Joint: No Bone: No Small (1-33%)]  [N/A:N/A] Epithelialization: [1:Compression Therapy] [N/A:N/A] Treatment Notes Wound #1 (Lower Leg) Wound Laterality: Left, Anterior, Proximal Cleanser Peri-Wound Care Zinc Oxide Ointment 30g tube Discharge Instruction: Apply Zinc Oxide to macerated periwound with each dressing change Sween Lotion (Moisturizing lotion) Discharge Instruction: Apply moisturizing lotion as directed Topical Primary Dressing KerraCel Ag Gelling Fiber Dressing, 2x2 in (silver alginate) Discharge Instruction: Apply silver alginate to wound bed as instructed Secondary Dressing Woven Gauze Sponge, Non-Sterile 4x4 in Discharge Instruction: Apply over primary dressing as directed. Secured With Compression Wrap FourPress (4 layer compression wrap) Discharge Instruction: Apply four layer compression as directed. Compression Stockings Add-Ons Electronic Signature(s) Signed: 05/01/2020 5:12:39 PM By: Zenaida Deed RN, BSN Signed: 05/01/2020 5:26:06 PM By: Baltazar Najjar MD Entered By: Baltazar Najjar on 05/01/2020 15:05:32 -------------------------------------------------------------------------------- Multi-Disciplinary Care Plan Details Patient Name: Date of Service: [place], [place] 05/01/2020 2:15 PM Medical Record Number: [id number] Patient Account Number: [id number] Date of Birth/Sex: Treating RN: August 08, 1965 (55 y.o. [name] Primary Care Hamna Asa: [name] Other Clinician: Referring Jovonta Levit: Treating Yamilka Lopiccolo/Extender: Tenny Craw in Treatment: 5 Multidisciplinary Care Plan reviewed with physician Active Inactive Nutrition Nursing Diagnoses: Impaired glucose control: actual or potential Potential for alteratiion in Nutrition/Potential for imbalanced nutrition Goals: Patient/caregiver will maintain therapeutic glucose control Date Initiated: 03/25/2020 Target Resolution Date: 05/29/2020 Goal Status: Active Interventions: Assess HgA1c results as ordered  upon admission and as needed Assess patient nutrition upon admission and as needed per policy Provide education on elevated blood sugars and impact on wound healing Treatment Activities: Patient referred to Primary Care Physician for further nutritional evaluation : 03/25/2020 Notes: Venous Leg Ulcer Nursing Diagnoses: Knowledge deficit related to disease process and management Potential for venous Insuffiency (use before diagnosis confirmed) Goals: Patient will maintain optimal edema control Date Initiated: 03/25/2020 Target Resolution Date: 05/29/2020 Goal Status: Active Interventions: Assess peripheral edema status every visit. Compression as ordered Treatment Activities: Therapeutic compression applied : 03/25/2020 Notes: Wound/Skin Impairment Nursing Diagnoses: Impaired tissue integrity Knowledge deficit related to smoking impact on wound healing Knowledge deficit related to ulceration/compromised skin integrity Goals: Patient/caregiver will verbalize understanding of skin care regimen Date Initiated: 03/25/2020 Target Resolution Date: 05/29/2020 Goal Status: Active Ulcer/skin breakdown will have a volume reduction of 30% by week 4 Date Initiated: 03/25/2020 Target Resolution Date: 05/29/2020 Goal Status: Active Interventions: Assess patient/caregiver ability to obtain necessary supplies Assess patient/caregiver ability to perform ulcer/skin care regimen upon admission and as needed Assess ulceration(s) every visit Provide education on ulcer and skin care Treatment Activities: Skin care regimen initiated : 03/25/2020 Topical wound management initiated : 03/25/2020 Notes: Electronic Signature(s) Signed: 05/01/2020 2:15:46 PM By: Antonieta Iba Entered By: Antonieta Iba on 05/01/2020 14:15:45 -------------------------------------------------------------------------------- Pain Assessment Details Patient Name: Date of Service:  [place], [place] 05/01/2020 2:15 PM Medical Record  Number: [id number] Patient Account Number: [id number] Date of Birth/Sex: Treating RN: 25-Mar-1965 (55 y.o. [name] Primary Care Jettie Mannor: [name] Other Clinician: Referring Rogan Wigley: Treating Donovan Persley/Extender: Tenny Craw in Treatment: 5 Active Problems Location of Pain Severity and Description of Pain Patient Has Paino Yes Site Locations Pain Location: Pain in Ulcers With Dressing Change: Yes Duration of the Pain. Constant / Intermittento Intermittent Rate the pain. Current Pain Level: 3 Worst Pain Level: 7 Character of Pain Describe the Pain: Burning Pain Management and Medication Current Pain Management: Medication: Yes Is the Current Pain Management Adequate: Adequate How does your wound impact your activities of daily livingo Sleep: Yes Bathing: No Appetite: No Relationship With Others: No Bladder Continence: No Emotions: No Bowel Continence: No Work: No Toileting: No Drive: No Dressing: No Hobbies: No Electronic Signature(s) Signed: 05/01/2020 5:12:39 PM By: Zenaida Deed RN, BSN Entered By: Zenaida Deed on 05/01/2020 14:29:20 -------------------------------------------------------------------------------- Patient/Caregiver Education Details Patient Name: Date of Service: Perlie Mayo 3/18/2022andnbsp2:15 PM Medical Record Number: [id number] Patient Account Number: [id number] Date of Birth/Gender: Treating RN: Aug 13, 1965 (55 y.o. [name] Primary Care Physician: [name] Other Clinician: Referring Physician: Treating Physician/Extender: Tenny Craw in Treatment: 5 Education Assessment Education Provided To: Patient Education Topics Provided Venous: Methods: Explain/Verbal, Printed Responses: State content correctly Wound/Skin Impairment: Methods: Explain/Verbal, Printed Responses: State content correctly Electronic Signature(s) Signed: 05/01/2020 5:48:33  PM By: Antonieta Iba Entered By: Antonieta Iba on 05/01/2020 14:16:20 -------------------------------------------------------------------------------- Wound Assessment Details Patient Name: Date of Service: [place], [place] 05/01/2020 2:15 PM Medical Record Number: [id number] Patient Account Number: [id number] Date of Birth/Sex: Treating RN: [birth year] (55 y.o. [name] Primary Care Cleaster Shiffer: [name] Other Clinician: Referring Arav Bannister: Treating Destynee Stringfellow/Extender: Tenny Craw in Treatment: 5 Wound Status Wound Number: 1 Primary Etiology: Venous Leg Ulcer Wound Location: Left, Proximal, Anterior Lower Leg Wound Status: Open Wounding Event: Trauma Comorbid History: Sleep Apnea, Hypertension, Type II Diabetes, Gout Date Acquired: 01/26/2020 Weeks Of Treatment: 5 Clustered Wound: No Photos Wound Measurements Length: (cm) 1.8 Width: (cm) 1.3 Depth: (cm) 0.1 Area: (cm) 1.838 Volume: (cm) 0.184 % Reduction in Area: 8.2% % Reduction in Volume: 8% Epithelialization: Small (1-33%) Tunneling: No Undermining: No Wound Description Classification: Full Thickness Without Exposed Support Structures Wound Margin: Distinct, outline attached Exudate Amount: Medium Exudate Type: Purulent Exudate Color: yellow, brown, green Foul Odor After Cleansing: No Slough/Fibrino Yes Wound Bed Granulation Amount: Large (67-100%) Exposed Structure Granulation Quality: Red Fascia Exposed: No Necrotic Amount: Small (1-33%) Fat Layer (Subcutaneous Tissue) Exposed: Yes Necrotic Quality: Adherent Slough Tendon Exposed: No Muscle Exposed: No Joint Exposed: No Bone Exposed: No Treatment Notes Wound #1 (Lower Leg) Wound Laterality: Left, Anterior, Proximal Cleanser Peri-Wound Care Zinc Oxide Ointment 30g tube Discharge Instruction: Apply Zinc Oxide to macerated periwound with each dressing change Sween Lotion (Moisturizing lotion) Discharge  Instruction: Apply moisturizing lotion as directed Topical Primary Dressing KerraCel Ag Gelling Fiber Dressing, 2x2 in (silver alginate) Discharge Instruction: Apply silver alginate to wound bed as instructed Secondary Dressing Woven Gauze Sponge, Non-Sterile 4x4 in Discharge Instruction: Apply over primary dressing as directed. Secured With Compression Wrap FourPress (4 layer compression wrap) Discharge Instruction: Apply four layer compression as directed. Compression Stockings Add-Ons Electronic Signature(s) Signed: 05/04/2020 7:47:40 AM By: Karl Ito Signed: 05/04/2020 4:14:31 PM By: Zenaida Deed RN, BSN Previous Signature: 05/01/2020 5:12:39 PM Version By: Zenaida Deed RN, BSN Entered By:  Karl Itoawkins, Destiny on 05/03/2020 12:47:20 -------------------------------------------------------------------------------- Vitals Details Patient Name: Date of Service: [place], [place] 05/01/2020 2:15 PM Medical Record Number: [id number] Patient Account Number: [id number] Date of Birth/Sex: Treating RN: 03-15-1965 (55 y.o. Damaris SchoonerM) Boehlein, Linda Primary Care Tajah Schreiner: Azzie RoupAnderson, Shane Other Clinician: Referring Jailyne Chieffo: Treating Eriyah Fernando/Extender: Tenny Crawobson, Michael Anderson, Shane Weeks in Treatment: 5 Vital Signs Time Taken: 14:23 Temperature (F): 97.9 Height (in): 72 Pulse (bpm): 96 Source: Stated Respiratory Rate (breaths/min): 20 Weight (lbs): 360 Blood Pressure (mmHg): 185/101 Source: Stated Reference Range: 80 - 120 mg / dl Body Mass Index (BMI): 48.8 Electronic Signature(s) Signed: 05/01/2020 5:12:39 PM By: Zenaida DeedBoehlein, Linda RN, BSN Entered By: Zenaida DeedBoehlein, Linda on 05/01/2020 14:23:23

## 2020-05-03 ENCOUNTER — Emergency Department (HOSPITAL_COMMUNITY): Payer: BC Managed Care – PPO

## 2020-05-03 ENCOUNTER — Emergency Department (HOSPITAL_COMMUNITY)
Admission: EM | Admit: 2020-05-03 | Discharge: 2020-05-04 | Disposition: A | Discharge status: Home / Self Care | Payer: BC Managed Care – PPO | Attending: Emergency Medicine | Admitting: Emergency Medicine

## 2020-05-03 ENCOUNTER — Other Ambulatory Visit: Payer: Self-pay

## 2020-05-03 DIAGNOSIS — R778 Other specified abnormalities of plasma proteins: Secondary | ICD-10-CM

## 2020-05-03 DIAGNOSIS — Z79899 Other long term (current) drug therapy: Secondary | ICD-10-CM | POA: Diagnosis not present

## 2020-05-03 DIAGNOSIS — I110 Hypertensive heart disease with heart failure: Secondary | ICD-10-CM | POA: Diagnosis not present

## 2020-05-03 DIAGNOSIS — R799 Abnormal finding of blood chemistry, unspecified: Secondary | ICD-10-CM | POA: Insufficient documentation

## 2020-05-03 DIAGNOSIS — I509 Heart failure, unspecified: Secondary | ICD-10-CM | POA: Insufficient documentation

## 2020-05-03 DIAGNOSIS — I10 Essential (primary) hypertension: Secondary | ICD-10-CM

## 2020-05-03 DIAGNOSIS — R739 Hyperglycemia, unspecified: Secondary | ICD-10-CM

## 2020-05-03 DIAGNOSIS — N179 Acute kidney failure, unspecified: Secondary | ICD-10-CM

## 2020-05-03 NOTE — ED Triage Notes (Addendum)
Pt reports got some blood work done by PCP on Friday and states PCP told patient to come to the ED. Pt states hs PCP did not inform him of the abnormal blood work states all his labs should transfer over   Pt called daughter and daughter states his PCP reported elevated troponin pt denies any chest pain / sob

## 2020-05-04 LAB — BASIC METABOLIC PANEL
Anion gap: 9 (ref 5–15)
BUN: 37 mg/dL — ABNORMAL HIGH (ref 6–20)
CO2: 21 mmol/L — ABNORMAL LOW (ref 22–32)
Calcium: 9.2 mg/dL (ref 8.9–10.3)
Chloride: 99 mmol/L (ref 98–111)
Creatinine, Ser: 2.09 mg/dL — ABNORMAL HIGH (ref 0.61–1.24)
GFR, Estimated: 37 mL/min — ABNORMAL LOW (ref >60)
Glucose, Bld: 465 mg/dL — ABNORMAL HIGH (ref 70–99)
Potassium: 4.2 mmol/L (ref 3.5–5.1)
Sodium: 129 mmol/L — ABNORMAL LOW (ref 135–145)

## 2020-05-04 LAB — URINALYSIS, ROUTINE W REFLEX MICROSCOPIC
Bacteria, UA: NONE SEEN
Bilirubin Urine: NEGATIVE
Glucose, UA: >=500 mg/dL — AB
Ketones, ur: NEGATIVE mg/dL
Leukocytes,Ua: NEGATIVE
Nitrite: NEGATIVE
Protein, ur: 100 mg/dL — AB
Specific Gravity, Urine: 1.013 (ref 1.005–1.030)
pH: 5 (ref 5.0–8.0)

## 2020-05-04 LAB — CBC
HCT: 35.7 % — ABNORMAL LOW (ref 39.0–52.0)
Hemoglobin: 12.1 g/dL — ABNORMAL LOW (ref 13.0–17.0)
MCH: 30.1 pg (ref 26.0–34.0)
MCHC: 33.9 g/dL (ref 30.0–36.0)
MCV: 88.8 fL (ref 80.0–100.0)
Platelets: 272 10*3/uL (ref 150–400)
RBC: 4.02 MIL/uL — ABNORMAL LOW (ref 4.22–5.81)
RDW: 14.5 % (ref 11.5–15.5)
WBC: 10.2 10*3/uL (ref 4.0–10.5)
nRBC: 0 % (ref 0.0–0.2)

## 2020-05-04 LAB — TROPONIN I (HIGH SENSITIVITY)
Troponin I (High Sensitivity): 28 ng/L — ABNORMAL HIGH (ref <18)
Troponin I (High Sensitivity): 25 ng/L — ABNORMAL HIGH (ref <18)

## 2020-05-04 LAB — CBG MONITORING, ED
Glucose-Capillary: 399 mg/dL — ABNORMAL HIGH (ref 70–99)
Glucose-Capillary: 394 mg/dL — ABNORMAL HIGH (ref 70–99)
Glucose-Capillary: 346 mg/dL — ABNORMAL HIGH (ref 70–99)

## 2020-05-04 LAB — BRAIN NATRIURETIC PEPTIDE: B Natriuretic Peptide: 17.5 pg/mL (ref 0.0–100.0)

## 2020-05-04 MED ORDER — CLONIDINE HCL 0.1 MG PO TABS
0.1000 mg | ORAL_TABLET | Freq: Once | ORAL | Status: AC
  Administered 2020-05-04: 0.1 mg via ORAL
  Filled 2020-05-04: qty 1

## 2020-05-04 MED ORDER — METFORMIN HCL 500 MG PO TABS: 500.0000 mg | ORAL_TABLET | Freq: Two times a day (BID) | ORAL | 0 refills | Status: DC

## 2020-05-04 MED ORDER — INSULIN ASPART 100 UNIT/ML ~~LOC~~ SOLN
10.0000 [IU] | Freq: Once | SUBCUTANEOUS | Status: AC
  Administered 2020-05-04: 10 [IU] via SUBCUTANEOUS

## 2020-05-04 MED ORDER — INSULIN ASPART 100 UNIT/ML ~~LOC~~ SOLN
5.0000 [IU] | Freq: Once | SUBCUTANEOUS | Status: AC
  Administered 2020-05-04: 5 [IU] via SUBCUTANEOUS

## 2020-05-04 MED ORDER — FUROSEMIDE 10 MG/ML IJ SOLN
40.0000 mg | Freq: Once | INTRAMUSCULAR | Status: AC
  Administered 2020-05-04: 40 mg via INTRAVENOUS
  Filled 2020-05-04: qty 4

## 2020-05-04 MED ORDER — FUROSEMIDE 40 MG PO TABS: 40.0000 mg | ORAL_TABLET | Freq: Every day | ORAL | 0 refills | Status: DC

## 2020-05-04 NOTE — ED Notes (Signed)
Patient verbalizes understanding of discharge instructions. Opportunity for questioning and answers were provided. Armband removed by staff, pt discharged from ED ambulatory.   

## 2020-05-04 NOTE — Discharge Instructions (Addendum)
You likely have diabetes and that because your kidney function and heart function to be abnormal  You will need to be started on Metformin 500 mg twice daily.  You may need to be insulin as well.  Please also take Lasix 40 mg daily for 2 days and then you can cut back down to 20 mg  You need to call your primary care doctor today for appointment this week to recheck your kidney function and your sugar  Return to ER if you have chest pain, trouble urinating, shortness of breath, worsening leg swelling

## 2020-05-04 NOTE — ED Provider Notes (Signed)
[facility] EMERGENCY DEPARTMENT Provider Note   CSN: [id number] Arrival date & time: 05/03/20  2258     History Chief Complaint  Patient presents with  . Abnormal Lab    [name] [name] is a 55 y.o. male hx of HTN, chronic left leg wound here with abnormal labs. Patient has been gaining weight and having leg swelling for the last few months. Patient apparently had outpatient labs done 2 days ago that showed worsening kidney function and elevated troponin. Patient denies any chest pain and has stable shortness of breath. Patient states that he was told to come in 2 days ago but decided to come in today. Patient is on lasix 20 mg daily and there were no recent dosage changes or changes in his meds   The history is provided by the patient.       Past Medical History:  Diagnosis Date  . Hypertension     There are no problems to display for this patient.   Past Surgical History:  Procedure Laterality Date  . CHOLECYSTECTOMY         No family history on file.  Social History   Tobacco Use  . Smoking status: Never Smoker  . Smokeless tobacco: Never Used  Substance Use Topics  . Alcohol use: Never    Home Medications Prior to Admission medications   Medication Sig Start Date End Date Taking? Authorizing Provider  acetaminophen (TYLENOL) 500 MG tablet Take 1,000 mg by mouth every 6 (six) hours as needed for moderate pain or headache.   Yes [provider]  allopurinol (ZYLOPRIM) 300 MG tablet Take 300 mg by mouth daily. 09/09/19  Yes [provider]  amLODipine (NORVASC) 10 MG tablet Take 10 mg by mouth daily. 03/01/19  Yes [provider]  cloNIDine (CATAPRES) 0.1 MG tablet Take 0.1 mg by mouth 2 (two) times daily. 12/30/19  Yes [provider]  doxycycline (MONODOX) 100 MG capsule Take 100 mg by mouth See admin instructions. Bid x 7 days 05/01/20  Yes [provider]  furosemide (LASIX) 20 MG tablet Take 20 mg by  mouth daily. 11/28/19  Yes [provider]  GARLIC PO Take 1 capsule by mouth daily.   Yes [provider]  olmesartan (BENICAR) 40 MG tablet Take 40 mg by mouth daily. 12/27/19  Yes [provider]    Allergies    Prednisone  Review of Systems   Review of Systems  Cardiovascular: Positive for leg swelling.  All other systems reviewed and are negative.   Physical Exam Updated Vital Signs BP (!) 192/107   Pulse (!) 102   Temp 98.3 F (36.8 C) (Oral)   Resp 20   SpO2 95%   Physical Exam Vitals and nursing note reviewed.  Constitutional:      Appearance: Normal appearance.  HENT:     Head: Normocephalic.     Nose: Nose normal.     Mouth/Throat:     Mouth: Mucous membranes are moist.  Eyes:     Extraocular Movements: Extraocular movements intact.     Pupils: Pupils are equal, round, and reactive to light.  Cardiovascular:     Rate and Rhythm: Normal rate and regular rhythm.     Pulses: Normal pulses.     Heart sounds: Normal heart sounds.  Pulmonary:     Effort: Pulmonary effort is normal.     Breath sounds: Normal breath sounds.  Abdominal:     General: Abdomen is flat.  Palpations: Abdomen is soft.  Musculoskeletal:     Cervical back: Normal range of motion and neck supple.     Comments: 1+ edema bilaterally, L leg wound wrapped by wound care   Skin:    General: Skin is warm.     Capillary Refill: Capillary refill takes less than 2 seconds.  Neurological:     General: No focal deficit present.     Mental Status: He is alert and oriented to person, place, and time.  Psychiatric:        Mood and Affect: Mood normal.        Behavior: Behavior normal.     ED Results / Procedures / Treatments   Labs (all labs ordered are listed, but only abnormal results are displayed) Labs Reviewed  BASIC METABOLIC PANEL - Abnormal; Notable for the following components:      Result Value   Sodium 129 (*)    CO2 21 (*)    Glucose, Bld 465 (*)     BUN 37 (*)    Creatinine, Ser 2.09 (*)    GFR, Estimated 37 (*)    All other components within normal limits  CBC - Abnormal; Notable for the following components:   RBC 4.02 (*)    Hemoglobin 12.1 (*)    HCT 35.7 (*)    All other components within normal limits  URINALYSIS, ROUTINE W REFLEX MICROSCOPIC - Abnormal; Notable for the following components:   Color, Urine STRAW (*)    Glucose, UA >=500 (*)    Hgb urine dipstick SMALL (*)    Protein, ur 100 (*)    All other components within normal limits  CBG MONITORING, ED - Abnormal; Notable for the following components:   Glucose-Capillary 399 (*)    All other components within normal limits  CBG MONITORING, ED - Abnormal; Notable for the following components:   Glucose-Capillary 394 (*)    All other components within normal limits  TROPONIN I (HIGH SENSITIVITY) - Abnormal; Notable for the following components:   Troponin I (High Sensitivity) 28 (*)    All other components within normal limits  TROPONIN I (HIGH SENSITIVITY) - Abnormal; Notable for the following components:   Troponin I (High Sensitivity) 25 (*)    All other components within normal limits  BRAIN NATRIURETIC PEPTIDE    EKG EKG Interpretation  Date/Time:  Sunday May 03 2020 23:35:06 EDT Ventricular Rate:  102 PR Interval:  178 QRS Duration: 150 QT Interval:  398 QTC Calculation: 518 R Axis:   105 Text Interpretation: Sinus tachycardia Right bundle branch block Abnormal ECG No previous ECGs available Confirmed by Richardean Canal [phone]) on 05/04/2020 2:28:06 AM   Radiology DG Chest 2 View  Result Date: 05/04/2020 CLINICAL DATA:  55 year old male with elevated troponin. EXAM: CHEST - 2 VIEW COMPARISON:  None. FINDINGS: The lungs are clear. There is no pleural effusion pneumothorax. Top-normal cardiac size. No acute osseous pathology. IMPRESSION: No active cardiopulmonary disease. Electronically Signed   By: Elgie Collard M.D.   On: 05/04/2020 00:02     Procedures Procedures   Medications Ordered in ED Medications  furosemide (LASIX) injection 40 mg (40 mg Intravenous Given 05/04/20 0245)  insulin aspart (novoLOG) injection 5 Units (5 Units Subcutaneous Given 05/04/20 0246)  insulin aspart (novoLOG) injection 10 Units (10 Units Subcutaneous Given 05/04/20 0406)  cloNIDine (CATAPRES) tablet 0.1 mg (0.1 mg Oral Given 05/04/20 0406)    ED Course  I have reviewed the triage vital signs and  the nursing notes.  Pertinent labs & imaging results that were available during my care of the patient were reviewed by me and considered in my medical decision making (see chart for details).    MDM Rules/Calculators/A&P                         [name] [name] is a 55 y.o. male here with abnormal labs. Patient states that he has abnormal kidney functions and possible heart function test. Has no chest pain and has chronic SOB and leg swelling. Consider CHF exacerbation. Will get labs, BNP, CXR, repeat trop. Low suspicion for ACS   5:45 AM Patient BNP is normal.  His troponin is flat.  His glucose is 465 and creatinine is 2.  His BNP is normal.  Patient appears fluid overloaded.  Patient's blood sugar came down to 350 after insulin.  Patient states that he was on insulin previously.  He is no longer on anything for diabetes.  I think his labs are consistent with uncontrolled diabetes.  We will start on Metformin.  We will also increase his Lasix to 40 mg daily for 2 days.  Patient will need follow-up with PCP and may need to be restarted on insulin.   Final Clinical Impression(s) / ED Diagnoses Final diagnoses:  None    Rx / DC Orders ED Discharge Orders    None       Charlynne Pander, MD 05/04/20 [phone]

## 2020-05-06 ENCOUNTER — Emergency Department (HOSPITAL_COMMUNITY): Payer: BC Managed Care – PPO

## 2020-05-06 ENCOUNTER — Encounter (HOSPITAL_COMMUNITY): Payer: Self-pay

## 2020-05-06 ENCOUNTER — Other Ambulatory Visit: Payer: Self-pay

## 2020-05-06 ENCOUNTER — Emergency Department (HOSPITAL_COMMUNITY)
Admission: EM | Admit: 2020-05-06 | Discharge: 2020-05-06 | Disposition: A | Discharge status: Home / Self Care | Payer: BC Managed Care – PPO | Attending: Emergency Medicine | Admitting: Emergency Medicine

## 2020-05-06 DIAGNOSIS — T887XXA Unspecified adverse effect of drug or medicament, initial encounter: Secondary | ICD-10-CM

## 2020-05-06 DIAGNOSIS — T383X5A Adverse effect of insulin and oral hypoglycemic [antidiabetic] drugs, initial encounter: Secondary | ICD-10-CM | POA: Insufficient documentation

## 2020-05-06 DIAGNOSIS — I10 Essential (primary) hypertension: Secondary | ICD-10-CM | POA: Insufficient documentation

## 2020-05-06 DIAGNOSIS — Z79899 Other long term (current) drug therapy: Secondary | ICD-10-CM | POA: Insufficient documentation

## 2020-05-06 DIAGNOSIS — R531 Weakness: Secondary | ICD-10-CM | POA: Insufficient documentation

## 2020-05-06 DIAGNOSIS — E119 Type 2 diabetes mellitus without complications: Secondary | ICD-10-CM | POA: Diagnosis not present

## 2020-05-06 LAB — BASIC METABOLIC PANEL
Anion gap: 8 (ref 5–15)
BUN: 33 mg/dL — ABNORMAL HIGH (ref 6–20)
CO2: 24 mmol/L (ref 22–32)
Calcium: 9.6 mg/dL (ref 8.9–10.3)
Chloride: 102 mmol/L (ref 98–111)
Creatinine, Ser: 1.91 mg/dL — ABNORMAL HIGH (ref 0.61–1.24)
GFR, Estimated: 41 mL/min — ABNORMAL LOW (ref >60)
Glucose, Bld: 322 mg/dL — ABNORMAL HIGH (ref 70–99)
Potassium: 3.8 mmol/L (ref 3.5–5.1)
Sodium: 134 mmol/L — ABNORMAL LOW (ref 135–145)

## 2020-05-06 LAB — CBC
HCT: 37.4 % — ABNORMAL LOW (ref 39.0–52.0)
Hemoglobin: 12.9 g/dL — ABNORMAL LOW (ref 13.0–17.0)
MCH: 30.8 pg (ref 26.0–34.0)
MCHC: 34.5 g/dL (ref 30.0–36.0)
MCV: 89.3 fL (ref 80.0–100.0)
Platelets: 313 10*3/uL (ref 150–400)
RBC: 4.19 MIL/uL — ABNORMAL LOW (ref 4.22–5.81)
RDW: 14.8 % (ref 11.5–15.5)
WBC: 9 10*3/uL (ref 4.0–10.5)
nRBC: 0 % (ref 0.0–0.2)

## 2020-05-06 LAB — CBG MONITORING, ED: Glucose-Capillary: 295 mg/dL — ABNORMAL HIGH (ref 70–99)

## 2020-05-06 MED ORDER — GLUCOSE BLOOD VI STRP: ORAL_STRIP | 12 refills | Status: AC

## 2020-05-06 MED ORDER — GLIPIZIDE 5 MG PO TABS: 2.5000 mg | ORAL_TABLET | Freq: Every day | ORAL | 0 refills | Status: DC

## 2020-05-06 MED ORDER — HYDRALAZINE HCL 10 MG PO TABS
10.0000 mg | ORAL_TABLET | Freq: Once | ORAL | Status: AC
  Administered 2020-05-06: 10 mg via ORAL
  Filled 2020-05-06: qty 1

## 2020-05-06 MED ORDER — BLOOD GLUCOSE MONITOR KIT: PACK | 0 refills | Status: AC

## 2020-05-06 NOTE — ED Provider Notes (Signed)
[facility] EMERGENCY DEPARTMENT Provider Note   CSN: [id number] Arrival date & time: 05/06/20  1217     History Chief Complaint  Patient presents with  . Medication Reaction    [name] [name] is a 55 y.o. male who presents with concern for generalized weakness and dry mouth that started after he began Metformin approximately 2 days ago.  Patient with history of diabetes but has not been on medication for some time and was started on Metformin from the emergency department.  He denies any swelling of his lips, tongue, throat, difficulty swallowing, difficulty breathing, chest pain, shortness of breath, palpitations, lightheadedness, dizziness, syncope or near syncope.  He has not taken the Metformin today.  He denies any pain at this time.    I personally reviewed this patient's medical records.  He has history of hypertension on multiple medications and diabetes.  HPI     Past Medical History:  Diagnosis Date  . Hypertension     There are no problems to display for this patient.   Past Surgical History:  Procedure Laterality Date  . CHOLECYSTECTOMY         No family history on file.  Social History   Tobacco Use  . Smoking status: Never Smoker  . Smokeless tobacco: Never Used  Substance Use Topics  . Alcohol use: Never    Home Medications Prior to Admission medications   Medication Sig Start Date End Date Taking? Authorizing Provider  acetaminophen (TYLENOL) 500 MG tablet Take 1,000 mg by mouth every 6 (six) hours as needed for moderate pain or headache.   Yes [provider]  allopurinol (ZYLOPRIM) 300 MG tablet Take 300 mg by mouth daily. 09/09/19  Yes [provider]  amLODipine (NORVASC) 10 MG tablet Take 10 mg by mouth daily. 03/01/19  Yes [provider]  blood glucose meter kit and supplies KIT Dispense based on patient and insurance preference. Use up to four times daily as directed. 05/06/20  Yes Sponseller, Rebekah  R, PA-C  carvedilol (COREG) 6.25 MG tablet Take 12.5 mg by mouth 2 (two) times daily as needed. 05/04/20  Yes [provider]  cloNIDine (CATAPRES) 0.1 MG tablet Take 0.1 mg by mouth 2 (two) times daily. 12/30/19  Yes [provider]  doxycycline (MONODOX) 100 MG capsule Take 100 mg by mouth See admin instructions. Bid x 7 days 05/01/20  Yes [provider]  furosemide (LASIX) 20 MG tablet Take 20 mg by mouth daily. 11/28/19  Yes [provider]  GARLIC PO Take 1 capsule by mouth daily.   Yes [provider]  glipiZIDE (GLUCOTROL) 5 MG tablet Take 0.5 tablets (2.5 mg total) by mouth daily before breakfast for 14 days. 05/06/20 05/20/20 Yes Sponseller, Rebekah R, PA-C  glucose blood test strip Use as instructed 05/06/20  Yes Sponseller, Rebekah R, PA-C  hydrALAZINE (APRESOLINE) 25 MG tablet Take 12.5 mg by mouth in the morning and at bedtime.   Yes [provider]  metFORMIN (GLUCOPHAGE) 500 MG tablet Take 1 tablet (500 mg total) by mouth 2 (two) times daily with a meal. 05/04/20  Yes Drenda Freeze, MD  olmesartan (BENICAR) 40 MG tablet Take 40 mg by mouth daily. 12/27/19  Yes [provider]    Allergies    Prednisone  Review of Systems   Review of Systems  Constitutional: Positive for fatigue. Negative for activity change, appetite change, chills, diaphoresis and fever.  HENT: Negative.   Eyes: Negative.  Respiratory: Negative.   Cardiovascular: Negative.   Gastrointestinal: Negative.   Endocrine:       Dry mouth  Genitourinary: Negative.   Musculoskeletal: Negative.   Skin: Negative.   Neurological: Negative.   Hematological: Negative.     Physical Exam Updated Vital Signs BP (!) 170/98 (BP Location: Right Arm)   Pulse 92   Temp 98.1 F (36.7 C)   Resp 16   SpO2 99%   Physical Exam Vitals and nursing note reviewed.  Constitutional:      Appearance: He is obese. He is not ill-appearing or toxic-appearing.   HENT:     Head: Normocephalic and atraumatic.     Nose: Nose normal.     Mouth/Throat:     Mouth: Mucous membranes are dry.     Tongue: Tongue does not deviate from midline.     Palate: No mass and lesions.     Pharynx: Oropharynx is clear. Uvula midline. No pharyngeal swelling, oropharyngeal exudate, posterior oropharyngeal erythema or uvula swelling.     Tonsils: No tonsillar exudate.     Comments: No oropharyngeal swelling or sign of abscess, no sublingual or submental tenderness to palpation no tenderness palpation of the anterior neck. Eyes:     General: Lids are normal. Vision grossly intact.        Right eye: No discharge.        Left eye: No discharge.     Extraocular Movements: Extraocular movements intact.     Conjunctiva/sclera: Conjunctivae normal.     Pupils: Pupils are equal, round, and reactive to light.  Neck:     Trachea: Trachea and phonation normal.  Cardiovascular:     Rate and Rhythm: Normal rate and regular rhythm.     Pulses: Normal pulses.     Heart sounds: Normal heart sounds. No murmur heard.     Comments: Patient with compression wraps on both lower extremities due to severe lower extremity edema Pulmonary:     Effort: Pulmonary effort is normal. No tachypnea, bradypnea, accessory muscle usage or respiratory distress.     Breath sounds: Normal breath sounds. No wheezing or rales.  Chest:     Chest wall: No deformity, swelling, tenderness, crepitus or edema.  Abdominal:     General: Bowel sounds are normal. There is no distension.     Palpations: Abdomen is soft.     Tenderness: There is no abdominal tenderness. There is no right CVA tenderness, left CVA tenderness, guarding or rebound.  Musculoskeletal:        General: No deformity.     Cervical back: Full passive range of motion without pain, normal range of motion and neck supple. No edema, rigidity, tenderness or crepitus. No pain with movement, spinous process tenderness or muscular tenderness.      Right lower leg: 2+ Pitting Edema present.     Left lower leg: 2+ Pitting Edema present.  Lymphadenopathy:     Cervical: No cervical adenopathy.  Skin:    General: Skin is warm and dry.     Capillary Refill: Capillary refill takes less than 2 seconds.     Findings: No rash.  Neurological:     General: No focal deficit present.     Mental Status: He is alert and oriented to person, place, and time. Mental status is at baseline.     Sensory: Sensation is intact.     Motor: Motor function is intact.     Coordination: Coordination is intact.  Psychiatric:  Mood and Affect: Mood normal.     ED Results / Procedures / Treatments   Labs (all labs ordered are listed, but only abnormal results are displayed) Labs Reviewed  BASIC METABOLIC PANEL - Abnormal; Notable for the following components:      Result Value   Sodium 134 (*)    Glucose, Bld 322 (*)    BUN 33 (*)    Creatinine, Ser 1.91 (*)    GFR, Estimated 41 (*)    All other components within normal limits  CBC - Abnormal; Notable for the following components:   RBC 4.19 (*)    Hemoglobin 12.9 (*)    HCT 37.4 (*)    All other components within normal limits  CBG MONITORING, ED - Abnormal; Notable for the following components:   Glucose-Capillary 295 (*)    All other components within normal limits    EKG EKG Interpretation  Date/Time:  Wednesday May 06 2020 15:42:31 EDT Ventricular Rate:  83 PR Interval:  180 QRS Duration: 146 QT Interval:  414 QTC Calculation: 486 R Axis:   100 Text Interpretation: Normal sinus rhythm Possible Left atrial enlargement Right bundle branch block Abnormal ECG 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 Heart is upper normal in size with pulmonary vascularity normal. No adenopathy. No bone lesions.  IMPRESSION: Lungs clear.  Heart upper normal in size. Electronically Signed   By: Lowella Grip III M.D.   On: 05/06/2020 16:19    Procedures Procedures   Medications Ordered in ED Medications  hydrALAZINE (APRESOLINE) tablet 10 mg (10 mg Oral Given 05/06/20 1625)    ED Course  I have reviewed the triage vital signs and the nursing notes.  Pertinent labs & imaging results that were available during my care of the patient were reviewed by me and considered in my medical decision making (see chart for details).  Clinical Course as of 05/08/20 0056  Wed May 06, 2020  1736 Became severely hypertensive with SBP >200 while in the ED. Remains asymptomatic at this time. Hydralazine 10 mg PO administered.   [RS]    Clinical Course User Index [RS] Sponseller, Sharlene Dory   MDM Rules/Calculators/A&P                         82 old male who presents with concern for dry mouth and fatigue after starting Metformin.  Hypertensive on intake, vital signs otherwise normal.  Cardiopulmonary exam is normal, abdominal exam is benign.  HEENT exam reveals dry mucous membranes, otherwise unremarkable.  No sign of Ludwig's angina or oropharyngeal abscess.  There is no crepitus or swelling of the neck, no tenderness to palpation.Bilateral lower extremity edema with compression wrappings in place.   CBC with mild anemia, hemoglobin 12.9, patient baseline.  BMP with baseline creatinine and elevated glucose improved from visit in the ED 2 days ago.  CXR negative for acute cardiopulmonary disease, EKG reassuring.   No further work up warranted in the ED at this time.  Will discontinue metformin and DC with new prescription for glipizide as well as glucometer and home testing strips, will recommend close outpatient follow-up for recheck of patient's blood sugar.  Additionally patient was hypertensive in the emergency department required additional dose of oral medication on top of  his multiple medications  at home.  Recommend close outpatient follow-up for hypertension.  He remains asymptomatic at this time, no sign of endorgan injury on his blood work today.  Anthonio voiced understanding of his medical evaluation and treatment plan.  Each of his questions was answered to his expressed satisfaction.  Return precautions given.  Patient is well-appearing, stable, and appropriate for discharge at this time.  This chart was dictated using voice recognition software, Dragon. Despite the best efforts of this provider to proofread and correct errors, errors may still occur which can change documentation meaning.  Final Clinical Impression(s) / ED Diagnoses Final diagnoses:  Medication side effect    Rx / DC Orders ED Discharge Orders         Ordered    glipiZIDE (GLUCOTROL) 5 MG tablet  Daily before breakfast        05/06/20 1730    blood glucose meter kit and supplies KIT        05/06/20 1730    glucose blood test strip        05/06/20 1730    For home use only DME Glucometer        05/06/20 1730           Sponseller, Gypsy Balsam, PA-C 05/08/20 0056    Lorelle Gibbs, DO 05/11/20 1031

## 2020-05-06 NOTE — ED Triage Notes (Signed)
Patient complains of general weakness and dry mouth and thinks related to starting metformin this week. Patient alert and oriented, denies pain

## 2020-05-06 NOTE — Discharge Instructions (Addendum)
You were seen in the ER today for your dry mouth and weakness after being on Metformin.  Your blood work, physical exam, vital signs and EKG, chest x-ray were very reassuring.  Suspect you had a reaction to the Metformin.  You may discontinue this medication and to start taking glipizide, which is a new blood sugar medication treatment prescribed a 2-week supply.  Additionally been prescribed a blood glucose monitor and test strips which you should utilize test your blood sugars at home.  Below is contact information for [facility],, follow-up with your primary care doctor for blood sugar recheck and refill of your blood sugar medication in 1 to 2 weeks, we await to see your PCP.  Return to the emergency department if you develop any new chest pain, shortness of breath, palpitations, nausea or vomiting does not stop, dizziness, blurry vision, or any other new severe symptoms.

## 2020-05-06 NOTE — ED Notes (Signed)
Notified Rebekah, PA of orthostatic v/s w/ hypertensive values of 242/129, 222/111.

## 2020-05-08 ENCOUNTER — Encounter (HOSPITAL_BASED_OUTPATIENT_CLINIC_OR_DEPARTMENT_OTHER): Payer: BC Managed Care – PPO | Admitting: Internal Medicine

## 2020-05-14 ENCOUNTER — Encounter (HOSPITAL_BASED_OUTPATIENT_CLINIC_OR_DEPARTMENT_OTHER): Payer: BC Managed Care – PPO | Admitting: Internal Medicine

## 2020-05-15 ENCOUNTER — Other Ambulatory Visit: Payer: Self-pay

## 2020-05-15 ENCOUNTER — Encounter (HOSPITAL_BASED_OUTPATIENT_CLINIC_OR_DEPARTMENT_OTHER): Payer: BC Managed Care – PPO | Attending: Internal Medicine | Admitting: Internal Medicine

## 2020-05-15 DIAGNOSIS — E11622 Type 2 diabetes mellitus with other skin ulcer: Secondary | ICD-10-CM | POA: Diagnosis present

## 2020-05-15 DIAGNOSIS — G4730 Sleep apnea, unspecified: Secondary | ICD-10-CM | POA: Insufficient documentation

## 2020-05-15 DIAGNOSIS — I890 Lymphedema, not elsewhere classified: Secondary | ICD-10-CM | POA: Diagnosis not present

## 2020-05-15 DIAGNOSIS — L97822 Non-pressure chronic ulcer of other part of left lower leg with fat layer exposed: Secondary | ICD-10-CM | POA: Insufficient documentation

## 2020-05-15 DIAGNOSIS — I872 Venous insufficiency (chronic) (peripheral): Secondary | ICD-10-CM | POA: Insufficient documentation

## 2020-05-15 DIAGNOSIS — I10 Essential (primary) hypertension: Secondary | ICD-10-CM | POA: Insufficient documentation

## 2020-05-15 NOTE — Progress Notes (Signed)
**Note [name]-Identified via Obfuscation** EDDI, [name] ([id number]) Visit Report for 05/15/2020 HPI Details Patient Name: Date of Service: [place], [place] [name] 05/15/2020 2:00 PM Medical Record Number: [id number] Patient Account Number: [id number] Date of Birth/Sex: Treating RN: Jan 30, 1966 (55 y.o. [name] [name] Primary Care Provider: PCP, NO Other Clinician: Referring Provider: Treating Provider/Extender: [name] [name] in Treatment: 7 History of Present Illness HPI Description: 03/25/2020 upon evaluation today patient appears to be doing somewhat poorly in regard to his left lower extremity on the anterior portion. He has 2 wounds here that are actually giving him quite a bit of pain. They occurred due to an injury on December 12 when he struck his anterior portion of the leg with a window. He was carrying this at the time. With that being said it was a little cut he thought it would heal he put a little bit antibiotic ointment was using peroxide. With that being said he tells me that on December 19 he actually went [facility] because it did not seem to be healing appropriately they give him Keflex told him to quit using the peroxide and it he was hoping that would be the end of it. Unfortunately 2 [name] later on January 5 he ended up in the ER at [facility] where they gave him doxycycline and recommended that he come here. He does have a history of diabetes mellitus type 2 his most recent hemoglobin A1c on October 2021 was 6.7. With that being said he tells me he does not take any medications for this currently. He does also have a history of sleep apnea though his ischium is broken at the moment he does know he needs to call to get that fixed. He also has hypertension which has been higher due to the fact that his sleep apnea is not controlled he tells me. With regard to his wound I think the biggest issue we see here is really that he has significant swelling due to venous stasis that likely is limiting his ability to  be able to heal appropriately. I think that we may need to see what we can do to improve his edema status. 2/18; this was a patient who was admitted that the clinic last week. He has a area on the left anterior lower leg x2 when he arrived here. Changes of chronic venous insufficiency. We used Iodoflex under a 2 layer compression system. He arrives back in clinic complaining of a lot of pain, pain with elevation of his leg. 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 He arrives in clinic with the more distal wound closed over although there is some callus in this area. The more proximal wound is larger still with some debris on the center. Because of his aggression of the complaints of pain I did not debride this. Change the primary dressing to [facility] question irritation from Iodoflex 2/25; left anterior lower leg. Better controlled today. We are going to try 4-layer compression on him today. Hydrofera [name] seems to be better tolerated than I had of 3//22 upon evaluation today patient appears to be doing well with regard to his wound. He has been tolerating the dressing changes. Fortunately this seems to be showing some signs of turnaround and improvement overall. There does not appear to be any evidence of  active infection at this time which is great news. 3/18; left anterior lower leg. Intake reported a large amount of purulent drainage although the wound certainly did not look infected there was maceration. The Hydrofera [name] did not seem able to absorb this and there is periwound maceration 4/1 left anterior lower leg this is down nicely this week. I switched him to silver alginate last week because of drainage. No evidence of infection. We have him in 4 layer compression. He still does not have compression  stocking Electronic Signature(s) Signed: 05/15/2020 5:01:47 PM By: [name] [name], [name] [name] Entered By: [name] [name], [name] [name] on 05/15/2020 15:29:09 -------------------------------------------------------------------------------- Physical Exam Details Patient Name: Date of Service: [name] [name], [name] [name] 05/15/2020 2:00 PM Medical Record Number: [id number] Patient Account Number: [id number] Date of Birth/Sex: Treating RN: 07/27/1965 (55 y.o. [name] MichaelsM) [name], [name] Primary Care Provider: PCP, NO Other Clinician: Referring Provider: Treating Provider/Extender: [name] Crawobson, [name] [name], [name] [name] in Treatment: 7 Respiratory work of breathing is normal. Cardiovascular No pulses are palpable. Have good edema control in the left leg. I just briefly checked his nonwounded leg on the right severe lymphedema. Notes Wound exam; really nothing looks ominous here. No debridement is necessary healthy looking granulation much smaller there is no depth. No evidence of infection Electronic Signature(s) Signed: 05/15/2020 5:01:47 PM By: [name] [name], [name] [name] Entered By: [name] [name], Zalyn Amend on 05/15/2020 15:30:07 -------------------------------------------------------------------------------- Physician Orders Details Patient Name: Date of Service: [name] [name] 05/15/2020 2:00 PM Medical Record Number: [id number] Patient Account Number: [id number] Date of Birth/Sex: Treating RN: 12/31/1965 (55 y.o. [name] MichaelsM) [name], [name] Primary Care Provider: PCP, NO Other Clinician: Referring Provider: Treating Provider/Extender: [name] Crawobson, [name] [name] [name], [name] [name] in Treatment: 7 Verbal / Phone Orders: No Diagnosis Coding ICD-10 Coding Code Description I87.2 Venous insufficiency (chronic) (peripheral) L97.822 Non-pressure chronic ulcer of other part of left lower leg with fat layer exposed I10 Essential (primary) hypertension E11.622 Type 2 diabetes mellitus with other skin ulcer G47.30 Sleep apnea, unspecified Follow-up Appointments Return  Appointment in 1 week. Bathing/ Shower/ Hygiene May shower with protection but do not get wound dressing(s) wet. - use cast protector to cover wrap in shower Edema Control - Lymphedema / SCD / Other Bilateral Lower Extremities Elevate legs to the level of the heart or above for 30 minutes daily and/or when sitting, a frequency of: - 3-4 times per day Avoid standing for long periods of time. Exercise regularly Additional Orders / Instructions Follow Nutritious Diet Wound Treatment Wound #1 - Lower Leg Wound Laterality: Left, Anterior, Proximal Peri-Wound Care: Zinc Oxide Ointment 30g tube 1 x Per Week/7 Days Discharge Instructions: Apply Zinc Oxide to macerated periwound with each dressing change Peri-Wound Care: Sween Lotion (Moisturizing lotion) 1 x Per Week/7 Days Discharge Instructions: Apply moisturizing lotion as directed Prim Dressing: KerraCel Ag Gelling Fiber Dressing, 2x2 in (silver alginate) 1 x Per Week/7 Days ary Discharge Instructions: Apply silver alginate to wound bed as instructed Secondary Dressing: Woven Gauze Sponge, Non-Sterile 4x4 in 1 x Per Week/7 Days Discharge Instructions: Apply over primary dressing as directed. Compression Wrap: FourPress (4 layer compression wrap) 1 x Per Week/7 Days Discharge Instructions: Apply four layer compression as directed. Electronic Signature(s) Signed: 05/15/2020 5:01:47 PM By: [name] [name], Mina Babula [name] Signed: 05/15/2020 5:18:29 PM By: Antonieta IbaBarnhart, [name] Signed: 05/15/2020 5:18:29 PM By: Antonieta IbaBarnhart, [name] Previous Signature: 05/15/2020 2:37:37 PM Version By: Antonieta IbaBarnhart, [name] Entered By: Antonieta IbaBarnhart, [name] on 05/15/2020 15:04:18 -------------------------------------------------------------------------------- Problem List Details Patient Name: [name], [name] [name] 05/15/2020 2:00 PM Medical Record Number: [id number]  Patient Account Number: [id number] Date of Birth/Sex: Treating RN: Nov 05, 1965 (55 y.o. [name] [name] Primary Care Provider: PCP,  NO Other Clinician: Referring Provider: Treating Provider/Extender: [name] [name] in Treatment: 7 Active Problems ICD-10 Encounter Code Description Active Date MDM Diagnosis I87.2 Venous insufficiency (chronic) (peripheral) 03/25/2020 No Yes L97.822 Non-pressure chronic ulcer of other part of left lower leg with fat layer exposed2/10/2020 No Yes I10 Essential (primary) hypertension 03/25/2020 No Yes E11.622 Type 2 diabetes mellitus with other skin ulcer 03/25/2020 No Yes G47.30 Sleep apnea, unspecified 03/25/2020 No Yes Inactive Problems Resolved Problems Electronic Signature(s) Signed: 05/15/2020 5:01:47 PM By: [name] Najjar [name] Previous Signature: 05/15/2020 2:37:10 PM Version By: Antonieta Iba Entered By: [name] Najjar on 05/15/2020 15:27:52 -------------------------------------------------------------------------------- Progress Note Details Patient Name: Date of Service: [name] [name], [name] [name] 05/15/2020 2:00 PM Medical Record Number: [id number] Patient Account Number: [id number] Date of Birth/Sex: Treating RN: Apr 30, 1965 (55 y.o. [name] [name] Primary Care Provider: PCP, NO Other Clinician: Referring Provider: Treating Provider/Extender: [name] [name] in Treatment: 7 Subjective History of Present Illness (HPI) 03/25/2020 upon evaluation today patient appears to be doing somewhat poorly in regard to his left lower extremity on the anterior portion. He has 2 wounds here that are actually giving him quite a bit of pain. They occurred due to an injury on December 12 when he struck his anterior portion of the leg with a window. He was carrying this at the time. With that being said it was a little cut he thought it would heal he put a little bit antibiotic ointment was using peroxide. With that being said he tells me that on December 19 [facility] because it did not seem to be healing appropriately they give him Keflex told him  to quit using the peroxide and it he was hoping that would be the end of it. Unfortunately 2 [name] later on January 5 he ended up in the ER at [facility] where they gave him doxycycline and recommended that he come here. He does have a history of diabetes mellitus type 2 his most recent hemoglobin A1c on October 2021 was 6.7. With that being said he tells me he does not take any medications for this currently. He does also have a history of sleep apnea though his ischium is broken at the moment he does know he needs to call to get that fixed. He also has hypertension which has been higher due to the fact that his sleep apnea is not controlled he tells me. With regard to his wound I think the biggest issue we see here is really that he has significant swelling due to venous stasis that likely is limiting his ability to be able to heal appropriately. I think that we may need to see what we can do to improve his edema status. 2/18; this was a patient who was admitted that the clinic last week. He has a area on the left anterior lower leg x2 when he arrived here. Changes of chronic venous insufficiency. We used Iodoflex under a 2 layer compression system. He arrives back in clinic complaining of a lot of pain, pain with elevation of his leg. 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 He arrives in clinic with the more distal wound  closed over although there is some callus in this area. The more proximal wound is larger still with some debris on the center. Because of his aggression of the complaints of pain I did not debride this. Change the primary dressing to [facility] question irritation from Iodoflex 2/25; left anterior lower leg. Better controlled today. We are going to try 4-layer compression on him today.  Hydrofera [name] seems to be better tolerated than I had of 3//22 upon evaluation today patient appears to be doing well with regard to his wound. He has been tolerating the dressing changes. Fortunately this seems to be showing some signs of turnaround and improvement overall. There does not appear to be any evidence of active infection at this time which is great news. 3/18; left anterior lower leg. Intake reported a large amount of purulent drainage although the wound certainly did not look infected there was maceration. The Hydrofera [name] did not seem able to absorb this and there is periwound maceration 4/1 left anterior lower leg this is down nicely this week. I switched him to silver alginate last week because of drainage. No evidence of infection. We have him in 4 layer compression. He still does not have compression stocking Objective Constitutional Vitals Time Taken: 2:19 PM, Height: 72 in, Weight: 360 lbs, BMI: 48.8, Respiratory Rate: 17 breaths/min. Respiratory work of breathing is normal. Cardiovascular No pulses are palpable. Have good edema control in the left leg. I just briefly checked his nonwounded leg on the right severe lymphedema. General Notes: Wound exam; really nothing looks ominous here. No debridement is necessary healthy looking granulation much smaller there is no depth. No evidence of infection Integumentary (Hair, Skin) Wound #1 status is Open. Original cause of wound was Trauma. The date acquired was: 01/26/2020. The wound has been in treatment 7 [name]. The wound is located on the Left,Proximal,Anterior Lower Leg. The wound measures 0.7cm length x 0.9cm width x 0.1cm depth; 0.495cm^2 area and 0.049cm^3 volume. There is Fat Layer (Subcutaneous Tissue) exposed. There is no tunneling or undermining noted. There is a medium amount of serosanguineous drainage noted. The wound margin is distinct with the outline attached to the wound base. There is large (67-100%) red  granulation within the wound bed. There is a small (1-33%) amount of necrotic tissue within the wound bed including Adherent Slough. Assessment Active Problems ICD-10 Venous insufficiency (chronic) (peripheral) Non-pressure chronic ulcer of other part of left lower leg with fat layer exposed Essential (primary) hypertension Type 2 diabetes mellitus with other skin ulcer Sleep apnea, unspecified Procedures Wound #1 Pre-procedure diagnosis of Wound #1 is a Venous Leg Ulcer located on the Left,Proximal,Anterior Lower Leg . There was a Four Layer Compression Therapy Procedure by Antonieta Iba, RN. Post procedure Diagnosis Wound #1: Same as Pre-Procedure Plan Follow-up Appointments: Return Appointment in 1 week. Bathing/ Shower/ Hygiene: May shower with protection but do not get wound dressing(s) wet. - use cast protector to cover wrap in shower Edema Control - Lymphedema / SCD / Other: Elevate legs to the level of the heart or above for 30 minutes daily and/or when sitting, a frequency of: - 3-4 times per day Avoid standing for long periods of time. Exercise regularly Additional Orders / Instructions: Follow Nutritious Diet WOUND #1: - Lower Leg Wound Laterality: Left, Anterior, Proximal Peri-Wound Care: Zinc Oxide Ointment 30g tube 1 x Per Week/7 Days Discharge Instructions: Apply Zinc Oxide to macerated periwound with each dressing change Peri-Wound Care: Sween Lotion (Moisturizing lotion) 1 x Per  Week/7 Days Discharge Instructions: Apply moisturizing lotion as directed Prim Dressing: KerraCel Ag Gelling Fiber Dressing, 2x2 in (silver alginate) 1 x Per Week/7 Days ary Discharge Instructions: Apply silver alginate to wound bed as instructed Secondary Dressing: Woven Gauze Sponge, Non-Sterile 4x4 in 1 x Per Week/7 Days Discharge Instructions: Apply over primary dressing as directed. Com pression Wrap: FourPress (4 layer compression wrap) 1 x Per Week/7 Days Discharge Instructions:  Apply four layer compression as directed. 1. I continued with the Kerracel AG. Nice improvement from last week. Still under 4-layer compression 2. I talked to him about the need for compression stockings. Indeed one of our nurses had talked to him about a month ago about external compression stockings. After I left the room her discharge nurse went over the same issues. I am not convinced that he is really of the idea of the necessity of stockings to control the swelling Electronic Signature(s) Signed: 05/15/2020 5:01:47 PM By: [name] Najjar [name] Entered By: [name] Najjar on 05/15/2020 15:31:17 -------------------------------------------------------------------------------- SuperBill Details Patient Name: Date of Service: [place], [name] [name] 05/15/2020 Medical Record Number: [id number] Patient Account Number: [id number] Date of Birth/Sex: Treating RN: 1965-09-30 (55 y.o. [name] [name] Primary Care Provider: PCP, NO Other Clinician: Referring Provider: Treating Provider/Extender: [name] [name] in Treatment: 7 Diagnosis Coding ICD-10 Codes Code Description I87.2 Venous insufficiency (chronic) (peripheral) L97.822 Non-pressure chronic ulcer of other part of left lower leg with fat layer exposed I10 Essential (primary) hypertension E11.622 Type 2 diabetes mellitus with other skin ulcer G47.30 Sleep apnea, unspecified Facility Procedures CPT4 Code: [id number] Description: (Facility Use Only) [phone] - APPLY MULTLAY COMPRS LWR LT LEG ICD-10 Diagnosis Description L97.822 Non-pressure chronic ulcer of other part of left lower leg with fat layer exposed I87.2 Venous insufficiency (chronic) (peripheral) Modifier: Quantity: 1 Physician Procedures : CPT4 Code Description Modifier [id number] - WC PHYS LEVEL 3 - EST PT 1 ICD-10 Diagnosis Description L97.822 Non-pressure chronic ulcer of other part of left lower leg with fat layer exposed I87.2 Venous insufficiency (chronic)  (peripheral) Quantity: Electronic Signature(s) Signed: 05/15/2020 5:01:47 PM By: [name] Najjar [name] Entered By: [name] Najjar on 05/15/2020 15:31:37

## 2020-05-18 NOTE — Progress Notes (Signed)
[name] [name], [name] [name] ([id number]) Visit Report for 05/15/2020 Arrival Information Details Patient Name: Date of Service: [place], [place] 05/15/2020 2:00 PM Medical Record Number: [id number] Patient Account Number: [id number] Date of Birth/Sex: Treating RN: 15-Aug-1965 (55 y.o. [name] [name], [name] [name] Primary Care [name] [name]: PCP, NO Other Clinician: Referring [name] [name]: Treating [name] [name]/Extender: [name] [name] in Treatment: 7 Visit Information History Since Last Visit Added or deleted any medications: No Patient Arrived: Ambulatory Any new allergies or adverse reactions: No Arrival Time: 14:19 Had a fall or experienced change in No Accompanied By: self activities of daily living that may affect Transfer Assistance: None risk of falls: Patient Identification Verified: Yes Signs or symptoms of abuse/neglect since last visito No Secondary Verification Process Completed: Yes Hospitalized since last visit: No Patient Requires Transmission-Based Precautions: No Implantable device outside of the clinic excluding No Patient Has Alerts: Yes cellular tissue based products placed in the center Patient Alerts: L ABI non compressible since last visit: Has Dressing in Place as Prescribed: Yes Pain Present Now: No Electronic Signature(s) Signed: 05/15/2020 5:01:26 PM By: [name] Mu RN Entered By: [name] [name] on 05/15/2020 14:19:46 -------------------------------------------------------------------------------- Compression Therapy Details Patient Name: Date of Service: [place] 05/15/2020 2:00 PM Medical Record Number: [id number] Patient Account Number: [id number] Date of Birth/Sex: Treating RN: 01-May-1965 (55 y.o. [name] Primary Care [name] [name]: PCP, NO Other Clinician: Referring Adayah Arocho: Treating [name] [name]/Extender: [name] [name] in Treatment: 7 Compression Therapy Performed for Wound Assessment: Wound #1 Left,Proximal,Anterior Lower  Leg Performed By: Clinician [name] Iba, RN Compression Type: Four Layer Post Procedure Diagnosis Same as Pre-procedure Electronic Signature(s) Signed: 05/15/2020 5:18:29 PM By: [name] [name] Entered By: [name] [name] on 05/15/2020 15:02:43 -------------------------------------------------------------------------------- Encounter Discharge Information Details Patient Name: Date of Service: [place], [place] 05/15/2020 2:00 PM Medical Record Number: [id number] Patient Account Number: [id number] Date of Birth/Sex: Treating RN: 1965-10-29 (55 y.o. [name] Primary Care [name] [name]: PCP, NO Other Clinician: Referring Sariyah Corcino: Treating [name] [name]/Extender: [name] [name] in Treatment: 7 Encounter Discharge Information Items Discharge Condition: Stable Ambulatory Status: Ambulatory Discharge Destination: Home Transportation: Private Auto Accompanied By: self Schedule Follow-up Appointment: Yes Clinical Summary of Care: Patient Declined Electronic Signature(s) Signed: 05/15/2020 5:19:58 PM By: [name] Deed RN, BSN Entered By: [name] [name] on 05/15/2020 15:23:35 -------------------------------------------------------------------------------- Lower Extremity Assessment Details Patient Name: Date of Service: [place], [place] 05/15/2020 2:00 PM Medical Record Number: [id number] Patient Account Number: [id number] Date of Birth/Sex: Treating RN: [birth year] (55 y.o. [name] [name] Primary Care [name] [name]: PCP, NO Other Clinician: Referring [name] [name]: Treating Alanea Woolridge/Extender: [name] [name] in Treatment: 7 Edema Assessment Assessed: [name] [name]: Yes] [name] [name]: No] Edema: [Left: Ye] [Right: s] Calf Left: Right: Point of Measurement: 40 cm From Medial Instep 49 cm Ankle Left: Right: Point of Measurement: 10 cm From Medial Instep 28 cm Vascular Assessment Pulses: Dorsalis Pedis Palpable: [Left:Yes] Posterior Tibial Palpable:  [Left:Yes] Electronic Signature(s) Signed: 05/15/2020 5:01:26 PM By: [name] Mu RN Entered By: [name] [name] on 05/15/2020 14:28:37 -------------------------------------------------------------------------------- Multi Wound Chart Details Patient Name: Date of Service: [place] 05/15/2020 2:00 PM Medical Record Number: [id number] Patient Account Number: [id number] Date of Birth/Sex: Treating RN: 1965/03/09 (55 y.o. [name] Primary Care Xander Jutras: PCP, NO Other Clinician: Referring [name] [name]: Treating [name] [name]/Extender: [name] [name] in Treatment: 7 Vital Signs Height(in): 72 Pulse(bpm): Weight(lbs): 360 Blood Pressure(mmHg): Body Mass Index(BMI): 49 Temperature(F): Respiratory Rate(breaths/min): 17 Photos: [1:No Photos Left, Proximal, Anterior Lower Leg] [N/A:N/A N/A] Wound Location: [  1:Trauma] [N/A:N/A] Wounding Event: [1:Venous Leg Ulcer] [N/A:N/A] Primary Etiology: [1:Sleep Apnea, Hypertension, Type II N/A] Comorbid History: [1:Diabetes, Gout 01/26/2020] [N/A:N/A] Date Acquired: [1:7] [N/A:N/A] Weeks of Treatment: [1:Open] [N/A:N/A] Wound Status: [1:0.7x0.9x0.1] [N/A:N/A] Measurements L x W x D (cm) [1:0.495] [N/A:N/A] A (cm) : rea [1:0.049] [N/A:N/A] Volume (cm) : [1:75.30%] [N/A:N/A] % Reduction in Area: [1:75.50%] [N/A:N/A] % Reduction in Volume: [1:Full Thickness Without Exposed] [N/A:N/A] Classification: [1:Support Structures Medium] [N/A:N/A] Exudate Amount: [1:Serosanguineous] [N/A:N/A] Exudate Type: [1:red, brown] [N/A:N/A] Exudate Color: [1:Distinct, outline attached] [N/A:N/A] Wound Margin: [1:Large (67-100%)] [N/A:N/A] Granulation Amount: [1:Red] [N/A:N/A] Granulation Quality: [1:Small (1-33%)] [N/A:N/A] Necrotic Amount: [1:Fat Layer (Subcutaneous Tissue): Yes N/A] Exposed Structures: [1:Fascia: No Tendon: No Muscle: No Joint: No Bone: No Small (1-33%)] [N/A:N/A] Epithelialization: [1:Compression Therapy]  [N/A:N/A] Treatment Notes Wound #1 (Lower Leg) Wound Laterality: Left, Anterior, Proximal Cleanser Peri-Wound Care Zinc Oxide Ointment 30g tube Discharge Instruction: Apply Zinc Oxide to macerated periwound with each dressing change Sween Lotion (Moisturizing lotion) Discharge Instruction: Apply moisturizing lotion as directed Topical Primary Dressing KerraCel Ag Gelling Fiber Dressing, 2x2 in (silver alginate) Discharge Instruction: Apply silver alginate to wound bed as instructed Secondary Dressing Woven Gauze Sponge, Non-Sterile 4x4 in Discharge Instruction: Apply over primary dressing as directed. Secured With Compression Wrap FourPress (4 layer compression wrap) Discharge Instruction: Apply four layer compression as directed. Compression Stockings Add-Ons Electronic Signature(s) Signed: 05/15/2020 5:01:47 PM By: Baltazar Najjar MD Signed: 05/15/2020 5:18:29 PM By: [name] [name] Entered By: Baltazar Najjar on 05/15/2020 15:28:26 -------------------------------------------------------------------------------- Multi-Disciplinary Care Plan Details Patient Name: Date of Service: [place], [place] 05/15/2020 2:00 PM Medical Record Number: [id number] Patient Account Number: [id number] Date of Birth/Sex: Treating RN: 09-29-1965 (55 y.o. [name] Primary Care Kendell Sagraves: PCP, NO Other Clinician: Referring Angelli Baruch: Treating Suresh Audi/Extender: [name] [name] in Treatment: 7 Multidisciplinary Care Plan reviewed with physician Active Inactive Nutrition Nursing Diagnoses: Impaired glucose control: actual or potential Potential for alteratiion in Nutrition/Potential for imbalanced nutrition Goals: Patient/caregiver will maintain therapeutic glucose control Date Initiated: 03/25/2020 Target Resolution Date: 05/29/2020 Goal Status: Active Interventions: Assess HgA1c results as ordered upon admission and as needed Assess patient nutrition upon admission and  as needed per policy Provide education on elevated blood sugars and impact on wound healing Treatment Activities: Patient referred to Primary Care Physician for further nutritional evaluation : 03/25/2020 Notes: Venous Leg Ulcer Nursing Diagnoses: Knowledge deficit related to disease process and management Potential for venous Insuffiency (use before diagnosis confirmed) Goals: Patient will maintain optimal edema control Date Initiated: 03/25/2020 Target Resolution Date: 05/29/2020 Goal Status: Active Interventions: Assess peripheral edema status every visit. Compression as ordered Treatment Activities: Therapeutic compression applied : 03/25/2020 Notes: Wound/Skin Impairment Nursing Diagnoses: Impaired tissue integrity Knowledge deficit related to smoking impact on wound healing Knowledge deficit related to ulceration/compromised skin integrity Goals: Patient/caregiver will verbalize understanding of skin care regimen Date Initiated: 03/25/2020 Target Resolution Date: 05/29/2020 Goal Status: Active Ulcer/skin breakdown will have a volume reduction of 30% by week 4 Date Initiated: 03/25/2020 Target Resolution Date: 05/29/2020 Goal Status: Active Interventions: Assess patient/caregiver ability to obtain necessary supplies Assess patient/caregiver ability to perform ulcer/skin care regimen upon admission and as needed Assess ulceration(s) every visit Provide education on ulcer and skin care Treatment Activities: Skin care regimen initiated : 03/25/2020 Topical wound management initiated : 03/25/2020 Notes: Electronic Signature(s) Signed: 05/15/2020 2:37:51 PM By: [name] [name] Entered By: [name] [name] on 05/15/2020 14:37:50 -------------------------------------------------------------------------------- Pain Assessment Details Patient Name: Date of Service: [place] 05/15/2020 2:00 PM  Medical Record Number: [id number] Patient Account Number: [id number] Date of Birth/Sex: Treating  RN: [birth year] (55 y.o. [name] [name] Primary Care Lasonja Lakins: PCP, NO Other Clinician: Referring Treyden Hakim: Treating Ahyana Skillin/Extender: [name] [name] in Treatment: 7 Active Problems Location of Pain Severity and Description of Pain Patient Has Paino No Site Locations Pain Management and Medication Current Pain Management: Electronic Signature(s) Signed: 05/15/2020 5:01:26 PM By: [name] Mu RN Entered By: [name] [name] on 05/15/2020 14:23:07 -------------------------------------------------------------------------------- Patient/Caregiver Education Details Patient Name: Date of Service: [place] 4/1/2022andnbsp2:00 PM Medical Record Number: [id number] Patient Account Number: [id number] Date of Birth/Gender: Treating RN: [birth year] (55 y.o. [name] Primary Care Physician: PCP, NO Other Clinician: Referring Physician: Treating Physician/Extender: [name] [name] in Treatment: 7 Education Assessment Education Provided To: Patient Education Topics Provided Venous: Methods: Explain/Verbal, Printed Responses: State content correctly Wound/Skin Impairment: Methods: Explain/Verbal, Printed Responses: State content correctly Electronic Signature(s) Signed: 05/15/2020 5:18:29 PM By: [name] [name] Entered By: [name] [name] on 05/15/2020 14:38:33 -------------------------------------------------------------------------------- Wound Assessment Details Patient Name: Date of Service: [place] 05/15/2020 2:00 PM Medical Record Number: [id number] Patient Account Number: [id number] Date of Birth/Sex: Treating RN: 05-08-1965 (55 y.o. [name] [name], [name] [name] Primary Care Finley Chevez: PCP, NO Other Clinician: Referring Lydie Stammen: Treating Cadarius Nevares/Extender: [name] [name] in Treatment: 7 Wound Status Wound Number: 1 Primary Etiology: Venous Leg Ulcer Wound Location: Left, Proximal,  Anterior Lower Leg Wound Status: Open Wounding Event: Trauma Comorbid History: Sleep Apnea, Hypertension, Type II Diabetes, Gout Date Acquired: 01/26/2020 Weeks Of Treatment: 7 Clustered Wound: No Photos Wound Measurements Length: (cm) 0.7 Width: (cm) 0.9 Depth: (cm) 0.1 Area: (cm) 0.495 Volume: (cm) 0.049 % Reduction in Area: 75.3% % Reduction in Volume: 75.5% Epithelialization: Small (1-33%) Tunneling: No Undermining: No Wound Description Classification: Full Thickness Without Exposed Support Structures Wound Margin: Distinct, outline attached Exudate Amount: Medium Exudate Type: Serosanguineous Exudate Color: red, brown Foul Odor After Cleansing: No Slough/Fibrino Yes Wound Bed Granulation Amount: Large (67-100%) Exposed Structure Granulation Quality: Red Fascia Exposed: No Necrotic Amount: Small (1-33%) Fat Layer (Subcutaneous Tissue) Exposed: Yes Necrotic Quality: Adherent Slough Tendon Exposed: No Muscle Exposed: No Joint Exposed: No Bone Exposed: No Treatment Notes Wound #1 (Lower Leg) Wound Laterality: Left, Anterior, Proximal Cleanser Peri-Wound Care Zinc Oxide Ointment 30g tube Discharge Instruction: Apply Zinc Oxide to macerated periwound with each dressing change Sween Lotion (Moisturizing lotion) Discharge Instruction: Apply moisturizing lotion as directed Topical Primary Dressing KerraCel Ag Gelling Fiber Dressing, 2x2 in (silver alginate) Discharge Instruction: Apply silver alginate to wound bed as instructed Secondary Dressing Woven Gauze Sponge, Non-Sterile 4x4 in Discharge Instruction: Apply over primary dressing as directed. Secured With Compression Wrap FourPress (4 layer compression wrap) Discharge Instruction: Apply four layer compression as directed. Compression Stockings Add-Ons Electronic Signature(s) Signed: 05/15/2020 5:01:26 PM By: [name] Mu RN Signed: 05/18/2020 10:23:55 AM By: Karl Ito Entered By: Karl Ito on 05/15/2020 16:44:16 -------------------------------------------------------------------------------- Vitals Details Patient Name: Date of Service: [place] 05/15/2020 2:00 PM Medical Record Number: [id number] Patient Account Number: [id number] Date of Birth/Sex: Treating RN: [birth year] (55 y.o. [name] [name] Primary Care Madeleine Fenn: PCP, NO Other Clinician: Referring Maurine Mowbray: Treating Jacek Colson/Extender: [name] [name] in Treatment: 7 Vital Signs Time Taken: 14:19 Respiratory Rate (breaths/min): 17 Height (in): 72 Reference Range: 80 - 120 mg / dl Weight (lbs): 660 Body Mass Index (BMI): 48.8 Electronic Signature(s) Signed: 05/15/2020 5:01:26 PM By: [name] Mu RN Entered By: [name] [name] on 05/15/2020 14:22:54

## 2020-05-22 ENCOUNTER — Encounter (HOSPITAL_BASED_OUTPATIENT_CLINIC_OR_DEPARTMENT_OTHER): Payer: BC Managed Care – PPO | Admitting: Internal Medicine

## 2020-05-22 ENCOUNTER — Other Ambulatory Visit: Payer: Self-pay

## 2020-05-22 DIAGNOSIS — E11622 Type 2 diabetes mellitus with other skin ulcer: Secondary | ICD-10-CM | POA: Diagnosis not present

## 2020-05-22 NOTE — Progress Notes (Signed)
CHRISTIAN, BORGERDING ([id number]) Visit Report for 05/22/2020 HPI Details Patient Name: Date of Service: [name] [name], [place] [name] 05/22/2020 2:30 PM Medical Record Number: [id number] Patient Account Number: [id number] Date of Birth/Sex: Treating RN: [birth year] (55 y.o. [name] Primary Care Provider: PCP, NO Other Clinician: Referring Provider: Treating Provider/Extender: Tenny Craw in Treatment: 8 History of Present Illness HPI Description: 03/25/2020 upon evaluation today patient appears to be doing somewhat poorly in regard to his left lower extremity on the anterior portion. He has 2 wounds here that are actually giving him quite a bit of pain. They occurred due to an injury on December 12 when he struck his anterior portion of the leg with a window. He was carrying this at the time. With that being said it was a little cut he thought it would heal he put a little bit antibiotic ointment was using peroxide. With that being said he tells me that on December 19 he actually went [facility] because it did not seem to be healing appropriately they give him Keflex told him to quit using the peroxide and it he was hoping that would be the end of it. Unfortunately 2 weeks later on January 5 he ended up in the ER at [facility] where they gave him doxycycline and recommended that he come here. He does have a history of diabetes mellitus type 2 his most recent hemoglobin A1c on October 2021 was 6.7. With that being said he tells me he does not take any medications for this currently. He does also have a history of sleep apnea though his ischium is broken at the moment he does know he needs to call to get that fixed. He also has hypertension which has been higher due to the fact that his sleep apnea is not controlled he tells me. With regard to his wound I think the biggest issue we see here is really that he has significant swelling due to venous stasis that likely is limiting his ability to  be able to heal appropriately. I think that we may need to see what we can do to improve his edema status. 2/18; this was a patient who was admitted that the clinic last week. He has a area on the left anterior lower leg x2 when he arrived here. Changes of chronic venous insufficiency. We used Iodoflex under a 2 layer compression system. He arrives back in clinic complaining of a lot of pain, pain with elevation of his leg. 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 He arrives in clinic with the more distal wound closed over although there is some callus in this area. The more proximal wound is larger still with some debris on the center. Because of his aggression of the complaints of pain I did not debride this. Change the primary dressing to [facility] question irritation from Iodoflex 2/25; left anterior lower leg. Better controlled today. We are going to try 4-layer compression on him today. Hydrofera Blue seems to be better tolerated than I had of 3//22 upon evaluation today patient appears to be doing well with regard to his wound. He has been tolerating the dressing changes. Fortunately this seems to be showing some signs of turnaround and improvement overall. There does not appear to be any evidence of  active infection at this time which is great news. 3/18; left anterior lower leg. Intake reported a large amount of purulent drainage although the wound certainly did not look infected there was maceration. The Hydrofera Blue did not seem able to absorb this and there is periwound maceration 4/1 left anterior lower leg this is down nicely this week. I switched him to silver alginate last week because of drainage. No evidence of infection. We have him in 4 layer compression. He still does not have compression  stocking 4/8; left anterior lower leg this is closed. He has not picked up his stockings because he lost the paperwork. Electronic Signature(s) Signed: 05/22/2020 4:45:31 PM By: Baltazar Najjar MD Entered By: Baltazar Najjar on 05/22/2020 15:32:43 -------------------------------------------------------------------------------- Physical Exam Details Patient Name: Date of Service: [place], [name] [name] 05/22/2020 2:30 PM Medical Record Number: [id number] Patient Account Number: [id number] Date of Birth/Sex: Treating RN: 11-Apr-1965 (55 y.o. [name] Primary Care Provider: PCP, NO Other Clinician: Referring Provider: Treating Provider/Extender: Tenny Craw in Treatment: 8 Constitutional Patient is hypertensive.. Pulse regular and within target range for patient.Marland Kitchen Respirations regular, non-labored and within target range.. Temperature is normal and within the target range for the patient.Marland Kitchen Appears in no distress. Notes Wound exam; everything is closed on the left anterior lower leg fully epithelialized he has nice edema control. He also has severe lymphedema on the right lower leg Electronic Signature(s) Signed: 05/22/2020 4:45:31 PM By: Baltazar Najjar MD Entered By: Baltazar Najjar on 05/22/2020 15:33:23 -------------------------------------------------------------------------------- Physician Orders Details Patient Name: Date of Service: [place], [name] [name] 05/22/2020 2:30 PM Medical Record Number: [id number] Patient Account Number: [id number] Date of Birth/Sex: Treating RN: [birth year] (55 y.o. [name] Primary Care Provider: PCP, NO Other Clinician: Referring Provider: Treating Provider/Extender: Tenny Craw in Treatment: 8 Verbal / Phone Orders: No Diagnosis Coding ICD-10 Coding Code Description I87.2 Venous insufficiency (chronic) (peripheral) L97.822 Non-pressure chronic ulcer of other part of left lower leg with fat layer  exposed I10 Essential (primary) hypertension E11.622 Type 2 diabetes mellitus with other skin ulcer G47.30 Sleep apnea, unspecified Discharge From [facility] Services Discharge from [facility] Center Bathing/ Shower/ Hygiene May shower with protection but do not get wound dressing(s) wet. - use cast protector to cover wrap in shower Edema Control - Lymphedema / SCD / Other Bilateral Lower Extremities Elevate legs to the level of the heart or above for 30 minutes daily and/or when sitting, a frequency of: - 3-4 times per day Avoid standing for long periods of time. Exercise regularly Compression stocking or Garment 20-30 mm/Hg pressure to: - -Wrap left leg with kerlix/Coban in office today -T obtain stockings from [facility] o Additional Orders / Instructions Follow Nutritious Diet Electronic Signature(s) Signed: 05/22/2020 4:45:31 PM By: Baltazar Najjar MD Signed: 05/22/2020 5:03:53 PM By: Antonieta Iba Previous Signature: 05/22/2020 2:58:44 PM Version By: Antonieta Iba Entered By: Antonieta Iba on 05/22/2020 15:06:37 -------------------------------------------------------------------------------- Problem List Details Patient Name: Date of Service: [place], [name] [name] 05/22/2020 2:30 PM Medical Record Number: [id number] Patient Account Number: [id number] Date of Birth/Sex: Treating RN: November 13, 1965 (55 y.o. 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/10/2020 No Yes I10 Essential (primary) hypertension 03/25/2020 No Yes  J19.147 Type 2 diabetes mellitus with other skin ulcer 03/25/2020 No Yes G47.30 Sleep apnea, unspecified 03/25/2020 No  Yes Inactive Problems Resolved Problems Electronic Signature(s) Signed: 05/22/2020 4:45:31 PM By: Baltazar Najjar MD Previous Signature: 05/22/2020 2:58:23 PM Version By: Antonieta Iba Entered By: Baltazar Najjar on 05/22/2020 15:32:05 -------------------------------------------------------------------------------- Progress Note Details Patient Name: Date of Service: [place], [name] [name] 05/22/2020 2:30 PM Medical Record Number: [id number] Patient Account Number: [id number] Date of Birth/Sex: Treating RN: 06/09/1965 (55 y.o. [name] Primary Care Provider: PCP, NO Other Clinician: Referring Provider: Treating Provider/Extender: Tenny Craw in Treatment: 8 Subjective History of Present Illness (HPI) 03/25/2020 upon evaluation today patient appears to be doing somewhat poorly in regard to his left lower extremity on the anterior portion. He has 2 wounds here that are actually giving him quite a bit of pain. They occurred due to an injury on December 12 when he struck his anterior portion of the leg with a window. He was carrying this at the time. With that being said it was a little cut he thought it would heal he put a little bit antibiotic ointment was using peroxide. With that being said he tells me that on December 19 he [facility] because it did not seem to be healing appropriately they give him Keflex told him to quit using the peroxide and it he was hoping that would be the end of it. Unfortunately 2 weeks later on January 5 he ended up in the ER at [facility] where they gave him doxycycline and recommended that he come here. He does have a history of diabetes mellitus type 2 his most recent hemoglobin A1c on October 2021 was 6.7. With that being said he tells me he does not take any medications for this currently. He does also have a history of sleep apnea though his ischium is broken at the moment he does know he needs to call to get that fixed.  He also has hypertension which has been higher due to the fact that his sleep apnea is not controlled he tells me. With regard to his wound I think the biggest issue we see here is really that he has significant swelling due to venous stasis that likely is limiting his ability to be able to heal appropriately. I think that we may need to see what we can do to improve his edema status. 2/18; this was a patient who was admitted that the clinic last week. He has a area on the left anterior lower leg x2 when he arrived here. Changes of chronic venous insufficiency. We used Iodoflex under a 2 layer compression system. He arrives back in clinic complaining of a lot of pain, pain with elevation of his leg. 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 He arrives in clinic with the more distal wound closed over although there is some callus in this area. The more proximal wound is larger still with some debris on the center. Because of his aggression of the complaints of pain I did not debride this. Change the primary dressing to [facility] question irritation from Iodoflex 2/25; left anterior lower leg. Better controlled today. We are going to try 4-layer compression on him today. Hydrofera Blue seems to be better tolerated than I had  of 3//22 upon evaluation today patient appears to be doing well with regard to his wound. He has been tolerating the dressing changes. Fortunately this seems to be showing some signs of turnaround and improvement overall. There does not appear to be any evidence of active infection at this time which is great news. 3/18; left anterior lower leg. Intake reported a large amount of purulent drainage although the wound certainly did not look infected there was maceration. The Hydrofera Blue did not  seem able to absorb this and there is periwound maceration 4/1 left anterior lower leg this is down nicely this week. I switched him to silver alginate last week because of drainage. No evidence of infection. We have him in 4 layer compression. He still does not have compression stocking 4/8; left anterior lower leg this is closed. He has not picked up his stockings because he lost the paperwork. Objective Constitutional Patient is hypertensive.. Pulse regular and within target range for patient.Marland Kitchen Respirations regular, non-labored and within target range.. Temperature is normal and within the target range for the patient.Marland Kitchen Appears in no distress. Vitals Time Taken: 2:41 PM, Height: 72 in, Weight: 360 lbs, BMI: 48.8, Temperature: 97.4 F, Pulse: 79 bpm, Respiratory Rate: 17 breaths/min, Blood Pressure: 189/99 mmHg. General Notes: Wound exam; everything is closed on the left anterior lower leg fully epithelialized he has nice edema control. He also has severe lymphedema on the right lower leg Integumentary (Hair, Skin) Wound #1 status is Healed - Epithelialized. Original cause of wound was Trauma. The date acquired was: 01/26/2020. The wound has been in treatment 8 weeks. The wound is located on the Left,Proximal,Anterior Lower Leg. The wound measures 0cm length x 0cm width x 0cm depth; 0cm^2 area and 0cm^3 volume. Assessment Active Problems ICD-10 Venous insufficiency (chronic) (peripheral) Non-pressure chronic ulcer of other part of left lower leg with fat layer exposed Essential (primary) hypertension Type 2 diabetes mellitus with other skin ulcer Sleep apnea, unspecified Plan Discharge From [facility] Services: Discharge from Wound Care Center Bathing/ Shower/ Hygiene: May shower with protection but do not get wound dressing(s) wet. - use cast protector to cover wrap in shower Edema Control - Lymphedema / SCD / Other: Elevate legs to the level of the heart or above for 30 minutes daily  and/or when sitting, a frequency of: - 3-4 times per day Avoid standing for long periods of time. Exercise regularly Compression stocking or Garment 20-30 mm/Hg pressure to: - -Wrap left leg with kerlix/Coban in office today -T obtain stockings from [facility] o Additional Orders / Instructions: Follow Nutritious Diet 1. The patient can be discharged in the clinic. 2. He is going to order stockings from [facility] when he leaves the clinic today 3. I put him in a kerlix Coban wrap on the left leg in order to keep things in place over the weekend 4. He has severe chronic lymphedema in the right leg as well probably even worse than the left where his wound was. 5. I suspect he is going to need more eggs aggressive management of his edema at some point Electronic Signature(s) Signed: 05/22/2020 4:45:31 PM By: Baltazar Najjar MD Entered By: Baltazar Najjar on 05/22/2020 15:34:21 -------------------------------------------------------------------------------- SuperBill Details Patient Name: Date of Service: [place], [name] [name] 05/22/2020 Medical Record Number: [id number] Patient Account Number: [id number] Date of Birth/Sex: Treating RN: December 03, 1965 (55 y.o. [name] Primary Care Provider: PCP, NO Other Clinician: Referring Provider: Treating Provider/Extender: Tenny Craw in Treatment: 8 Diagnosis  Coding ICD-10 Codes Code Description I87.2 Venous insufficiency (chronic) (peripheral) L97.822 Non-pressure chronic ulcer of other part of left lower leg with fat layer exposed I10 Essential (primary) hypertension E11.622 Type 2 diabetes mellitus with other skin ulcer G47.30 Sleep apnea, unspecified Facility Procedures CPT4 Code: [id number] Description: 99213 - WOUND CARE VISIT-LEV 3 EST PT Modifier: Quantity: 1 Physician Procedures : CPT4 Code Description Modifier [id number] 99213 - WC PHYS LEVEL 3 - EST PT ICD-10 Diagnosis Description L97.822 Non-pressure  chronic ulcer of other part of left lower leg with fat layer exposed E11.622 Type 2 diabetes mellitus with other skin ulcer Quantity: 1 Electronic Signature(s) Signed: 05/22/2020 4:45:31 PM By: Baltazar Najjarobson, Deshon Hsiao MD Previous Signature: 05/22/2020 3:09:57 PM Version By: Antonieta IbaBarnhart, Jodi Entered By: Baltazar Najjarobson, Allie Gerhold on 05/22/2020 15:34:45

## 2020-05-25 NOTE — Progress Notes (Signed)
[name] [name], [name] [name] ([id number]) Visit Report for 05/22/2020 Arrival Information Details Patient Name: Date of Service: [place], [place] 05/22/2020 2:30 PM Medical Record Number: [id number] Patient Account Number: [id number] Date of Birth/Sex: Treating RN: March 05, 1965 (55 y.o. [name] [name], [name] Primary Care Marquasha Brutus: PCP, NO Other Clinician: Referring Conner Neiss: Treating Deckard Stuber/Extender: Tenny Craw in Treatment: 8 Visit Information History Since Last Visit Added or deleted any medications: No Patient Arrived: Ambulatory Any new allergies or adverse reactions: No Arrival Time: 14:41 Had a fall or experienced change in No Accompanied By: self activities of daily living that may affect Transfer Assistance: None risk of falls: Patient Identification Verified: Yes Signs or symptoms of abuse/neglect since last visito No Secondary Verification Process Completed: Yes Hospitalized since last visit: No Patient Requires Transmission-Based Precautions: No Implantable device outside of the clinic excluding No Patient Has Alerts: Yes cellular tissue based products placed in the center Patient Alerts: L ABI non compressible since last visit: Has Dressing in Place as Prescribed: Yes Pain Present Now: No Electronic Signature(s) Signed: 05/25/2020 5:44:40 PM By: Fonnie Mu RN Entered By: Fonnie Mu on 05/22/2020 14:41:29 -------------------------------------------------------------------------------- Clinic Level of Care Assessment Details Patient Name: Date of Service: [place], [place] 05/22/2020 2:30 PM Medical Record Number: [id number] Patient Account Number: [id number] Date of Birth/Sex: Treating RN: 09-17-1965 (55 y.o. [name] [name] Primary Care Johnatan Baskette: PCP, NO Other Clinician: Referring Mihaela Fajardo: Treating Maggie Senseney/Extender: Tenny Craw in Treatment: 8 Clinic Level of Care Assessment Items TOOL 4 Quantity Score X- 1 0 Use when  only an EandM is performed on FOLLOW-UP visit ASSESSMENTS - Nursing Assessment / Reassessment X- 1 10 Reassessment of Co-morbidities (includes updates in patient status) X- 1 5 Reassessment of Adherence to Treatment Plan ASSESSMENTS - Wound and Skin A ssessment / Reassessment X - Simple Wound Assessment / Reassessment - one wound 1 5 []  - 0 Complex Wound Assessment / Reassessment - multiple wounds []  - 0 Dermatologic / Skin Assessment (not related to wound area) ASSESSMENTS - Focused Assessment []  - 0 Circumferential Edema Measurements - multi extremities []  - 0 Nutritional Assessment / Counseling / Intervention []  - 0 Lower Extremity Assessment (monofilament, tuning fork, pulses) []  - 0 Peripheral Arterial Disease Assessment (using hand held doppler) ASSESSMENTS - Ostomy and/or Continence Assessment and Care []  - 0 Incontinence Assessment and Management []  - 0 Ostomy Care Assessment and Management (repouching, etc.) PROCESS - Coordination of Care []  - 0 Simple Patient / Family Education for ongoing care X- 1 20 Complex (extensive) Patient / Family Education for ongoing care X- 1 10 Staff obtains , Records, T Results / Process Orders est []  - 0 Staff telephones HHA, Nursing Homes / Clarify orders / etc []  - 0 Routine Transfer to another Facility (non-emergent condition) []  - 0 Routine Hospital Admission (non-emergent condition) []  - 0 New Admissions / / Ordering NPWT Apligraf, etc. , []  - 0 Emergency Hospital Admission (emergent condition) []  - 0 Simple Discharge Coordination []  - 0 Complex (extensive) Discharge Coordination PROCESS - Special Needs []  - 0 Pediatric / Minor Patient Management []  - 0 Isolation Patient Management []  - 0 Hearing / Language / Visual special needs []  - 0 Assessment of Community assistance (transportation, D/C planning, etc.) X- 1 15 Additional assistance / Altered mentation []  - 0 Support  Surface(s) Assessment (bed, cushion, seat, etc.) INTERVENTIONS - Wound Cleansing / Measurement []  - 0 Simple Wound Cleansing - one wound []  - 0 Complex Wound Cleansing -  multiple wounds X- 1 5 Wound Imaging (photographs - any number of wounds) []  - 0 Wound Tracing (instead of photographs) []  - 0 Simple Wound Measurement - one wound []  - 0 Complex Wound Measurement - multiple wounds INTERVENTIONS - Wound Dressings []  - 0 Small Wound Dressing one or multiple wounds []  - 0 Medium Wound Dressing one or multiple wounds X- 1 20 Large Wound Dressing one or multiple wounds []  - 0 Application of Medications - topical []  - 0 Application of Medications - injection INTERVENTIONS - Miscellaneous []  - 0 External ear exam []  - 0 Specimen Collection (cultures, biopsies, blood, body fluids, etc.) []  - 0 Specimen(s) / Culture(s) sent or taken to Lab for analysis []  - 0 Patient Transfer (multiple staff / Nurse, adultHoyer Lift / Similar devices) []  - 0 Simple Staple / Suture removal (25 or less) []  - 0 Complex Staple / Suture removal (26 or more) []  - 0 Hypo / Hyperglycemic Management (close monitor of Blood Glucose) []  - 0 Ankle / Brachial Index (ABI) - do not check if billed separately X- 1 5 Vital Signs Has the patient been seen at the hospital within the last three years: Yes Total Score: 95 Level Of Care: New/Established - Level 3 Electronic Signature(s) Signed: 05/22/2020 5:03:53 PM By: Antonieta IbaBarnhart, Jodi Entered By: Antonieta IbaBarnhart, Jodi on 05/22/2020 15:09:48 -------------------------------------------------------------------------------- Encounter Discharge Information Details Patient Name: [place] 05/22/2020 2:30 PM Medical Record Number: [id number] Patient Account Number: [id number] Date of Birth/Sex: Treating RN: 1965-08-24 (55 y.o. [name] [name]) Deaton, Bobbi Primary Care Breyon Sigg: PCP, NO Other Clinician: Referring Elfreida Heggs: Treating Shaan Rhoads/Extender: Tenny Crawobson, Michael Anderson,  Shane Weeks in Treatment: 8 Encounter Discharge Information Items Discharge Condition: Stable Ambulatory Status: Ambulatory Discharge Destination: Home Transportation: Private Auto Accompanied By: self Schedule Follow-up Appointment: No Clinical Summary of Care: Notes lotion, kerlix, and coban to LLE. Explained to patient to remove in one week or if his compression stockings arrive before then. patient in agreement. Electronic Signature(s) Signed: 05/22/2020 4:53:51 PM By: Shawn Stalleaton, Bobbi Entered By: Shawn Stalleaton, Bobbi on 05/22/2020 16:53:30 -------------------------------------------------------------------------------- Lower Extremity Assessment Details Patient Name: [place] [facility] 05/22/2020 2:30 PM Medical Record Number: [id number] Patient Account Number: [id number] Date of Birth/Sex: Treating RN: 1965-08-24 (55 y.o. Lucious GrovesM) Breedlove, [name] Primary Care Hamzeh Tall: PCP, NO Other Clinician: Referring Sagan Wurzel: Treating Daysen Gundrum/Extender: Tenny Crawobson, Michael Anderson, Shane Weeks in Treatment: 8 Edema Assessment Assessed: Kyra Searles[Left: Yes] Franne Forts[Right: No] Edema: [Left: Ye] [Right: s] Calf Left: Right: Point of Measurement: 40 cm From Medial Instep 52.5 cm Ankle Left: Right: Point of Measurement: 10 cm From Medial Instep 29 cm Knee To Floor Left: Right: From Medial Instep 48 cm Vascular Assessment Pulses: Dorsalis Pedis Palpable: [Left:Yes] Posterior Tibial Palpable: [Left:Yes] Electronic Signature(s) Signed: 05/25/2020 5:44:40 PM By: Fonnie MuBreedlove, [name] RN Entered By: Fonnie MuBreedlove, [name] on 05/22/2020 14:48:48 -------------------------------------------------------------------------------- Multi Wound Chart Details Patient Name: [place] 05/22/2020 2:30 PM Medical Record Number: [id number] Patient Account Number: [id number] Date of Birth/Sex: Treating RN: 1965-08-24 (55 y.o. [name] MichaelsM) Barnhart, Jodi Primary Care Kolten Ryback: PCP, NO Other Clinician: Referring  Tyshawn Ciullo: Treating Daymion Nazaire/Extender: Tenny Crawobson, Michael Anderson, Shane Weeks in Treatment: 8 Vital Signs Height(in): 72 Pulse(bpm): 79 Weight(lbs): 360 Blood Pressure(mmHg): 189/99 Body Mass Index(BMI): 49 Temperature(F): 97.4 Respiratory Rate(breaths/min): 17 Photos: [1:No Photos Left, Proximal, Anterior Lower Leg] [N/A:N/A N/A] Wound Location: [1:Trauma] [N/A:N/A] Wounding Event: [1:Venous Leg Ulcer] [N/A:N/A] Primary Etiology: [1:Sleep Apnea, Hypertension, Type II] [N/A:N/A] Comorbid History: [1:Diabetes, Gout 01/26/2020] [N/A:N/A] Date Acquired: [1:8] [N/A:N/A] Weeks  of Treatment: [1:Healed - Epithelialized] [N/A:N/A] Wound Status: [1:0x0x0] [N/A:N/A] Measurements L x W x D (cm) [1:0] [N/A:N/A] A (cm) : rea [1:0] [N/A:N/A] Volume (cm) : [1:100.00%] [N/A:N/A] % Reduction in Area: [1:100.00%] [N/A:N/A] % Reduction in Volume: [1:Full Thickness Without Exposed] [N/A:N/A] Classification: [1:Support Structures] Treatment Notes Electronic Signature(s) Signed: 05/22/2020 4:45:31 PM By: Baltazar Najjar MD Signed: 05/22/2020 5:03:53 PM By: Antonieta Iba Entered By: Baltazar Najjar on 05/22/2020 15:32:13 -------------------------------------------------------------------------------- Multi-Disciplinary Care Plan Details Patient Name: Date of Service: [place] 05/22/2020 2:30 PM Medical Record Number: [id number] Patient Account Number: [id number] Date of Birth/Sex: Treating RN: February 12, 1966 (55 y.o. [name] [name] Primary Care Calvary Difranco: PCP, NO Other Clinician: Referring Datrell Dunton: Treating Trevaun Rendleman/Extender: Tenny Craw in Treatment: 8 Multidisciplinary Care Plan reviewed with physician Active Inactive Electronic Signature(s) Signed: 05/22/2020 3:09:08 PM By: Antonieta Iba Previous Signature: 05/22/2020 2:58:55 PM Version By: Antonieta Iba Entered By: Antonieta Iba on 05/22/2020  15:09:08 -------------------------------------------------------------------------------- Pain Assessment Details Patient Name: Date of Service: [place] 05/22/2020 2:30 PM Medical Record Number: [id number] Patient Account Number: [id number] Date of Birth/Sex: Treating RN: [birth year] (55 y.o. [name] Primary Care Jamarri Vuncannon: PCP, NO Other Clinician: Referring Christiann Hagerty: Treating Lander Eslick/Extender: Tenny Craw in Treatment: 8 Active Problems Location of Pain Severity and Description of Pain Patient Has Paino No Site Locations Rate the pain. Current Pain Level: 0 Pain Management and Medication Current Pain Management: Electronic Signature(s) Signed: 05/25/2020 5:44:40 PM By: Fonnie Mu RN Entered By: Fonnie Mu on 05/22/2020 14:41:56 -------------------------------------------------------------------------------- Patient/Caregiver Education Details Patient Name: Date of Service: Perlie Mayo 4/8/2022andnbsp2:30 PM Medical Record Number: [id number] Patient Account Number: [id number] Date of Birth/Gender: Treating RN: 11-Jun-1965 (55 y.o. [name] [name] Primary Care Physician: PCP, NO Other Clinician: Referring Physician: Treating Physician/Extender: Tenny Craw in Treatment: 8 Education Assessment Education Provided To: Patient Education Topics Provided Elevated Blood Sugar/ Impact on Healing: Methods: Explain/Verbal Responses: State content correctly Venous: Methods: Explain/Verbal, Printed Responses: State content correctly Wound/Skin Impairment: Methods: Explain/Verbal, Printed Responses: State content correctly Electronic Signature(s) Signed: 05/22/2020 5:03:53 PM By: Antonieta Iba Entered By: Antonieta Iba on 05/22/2020 14:59:56 -------------------------------------------------------------------------------- Wound Assessment Details Patient Name: Date of Service: [place], [place]  05/22/2020 2:30 PM Medical Record Number: [id number] Patient Account Number: [id number] Date of Birth/Sex: Treating RN: [birth year] (55 y.o. [name] [name], [name] Primary Care Geary Rufo: PCP, NO Other Clinician: Referring Brienne Liguori: Treating Hakeem Frazzini/Extender: Tenny Craw in Treatment: 8 Wound Status Wound Number: 1 Primary Etiology: Venous Leg Ulcer Wound Location: Left, Proximal, Anterior Lower Leg Wound Status: Healed - Epithelialized Wounding Event: Trauma Comorbid History: Sleep Apnea, Hypertension, Type II Diabetes, Gout Date Acquired: 01/26/2020 Weeks Of Treatment: 8 Clustered Wound: No Wound Measurements Length: (cm) Width: (cm) Depth: (cm) Area: (cm) Volume: (cm) 0 % Reduction in Area: 100% 0 % Reduction in Volume: 100% 0 0 0 Wound Description Classification: Full Thickness Without Exposed Support Structur es Electronic Signature(s) Signed: 05/22/2020 5:03:53 PM By: Antonieta Iba Signed: 05/25/2020 5:44:40 PM By: Fonnie Mu RN Entered By: Antonieta Iba on 05/22/2020 15:04:34 -------------------------------------------------------------------------------- Vitals Details Patient Name: Date of Service: [place] 05/22/2020 2:30 PM Medical Record Number: [id number] Patient Account Number: [id number] Date of Birth/Sex: Treating RN: [birth year] (55 y.o. [name] Primary Care Dareion Kneece: PCP, NO Other Clinician: Referring Mercadies Co: Treating Makinzi Prieur/Extender: Tenny Craw in Treatment: 8 Vital Signs Time Taken: 14:41 Temperature (F): 97.4 Height (in): 72 Pulse (bpm): 79 Weight (lbs):  360 Respiratory Rate (breaths/min): 17 Body Mass Index (BMI): 48.8 Blood Pressure (mmHg): 189/99 Reference Range: 80 - 120 mg / dl Electronic Signature(s) Signed: 05/25/2020 5:44:40 PM By: Fonnie Mu RN Entered By: Fonnie Mu on 05/22/2020 14:43:15

## 2021-06-13 ENCOUNTER — Encounter (HOSPITAL_COMMUNITY): Payer: Self-pay | Admitting: Oncology

## 2021-06-13 ENCOUNTER — Emergency Department (HOSPITAL_COMMUNITY)
Admission: EM | Admit: 2021-06-13 | Discharge: 2021-06-13 | Disposition: A | Discharge status: Home / Self Care | Payer: BC Managed Care – PPO | Attending: Emergency Medicine | Admitting: Emergency Medicine

## 2021-06-13 ENCOUNTER — Other Ambulatory Visit: Payer: Self-pay

## 2021-06-13 ENCOUNTER — Emergency Department (HOSPITAL_COMMUNITY): Payer: BC Managed Care – PPO

## 2021-06-13 DIAGNOSIS — Z79899 Other long term (current) drug therapy: Secondary | ICD-10-CM | POA: Insufficient documentation

## 2021-06-13 DIAGNOSIS — I87321 Chronic venous hypertension (idiopathic) with inflammation of right lower extremity: Secondary | ICD-10-CM | POA: Insufficient documentation

## 2021-06-13 DIAGNOSIS — R600 Localized edema: Secondary | ICD-10-CM

## 2021-06-13 DIAGNOSIS — R109 Unspecified abdominal pain: Secondary | ICD-10-CM | POA: Diagnosis not present

## 2021-06-13 DIAGNOSIS — I10 Essential (primary) hypertension: Secondary | ICD-10-CM | POA: Diagnosis not present

## 2021-06-13 DIAGNOSIS — M7989 Other specified soft tissue disorders: Secondary | ICD-10-CM | POA: Diagnosis present

## 2021-06-13 DIAGNOSIS — I872 Venous insufficiency (chronic) (peripheral): Secondary | ICD-10-CM

## 2021-06-13 LAB — COMPREHENSIVE METABOLIC PANEL
ALT: 34 U/L (ref 0–44)
AST: 30 U/L (ref 15–41)
Albumin: 3.8 g/dL (ref 3.5–5.0)
Alkaline Phosphatase: 55 U/L (ref 38–126)
Anion gap: 5 (ref 5–15)
BUN: 26 mg/dL — ABNORMAL HIGH (ref 6–20)
CO2: 25 mmol/L (ref 22–32)
Calcium: 8.9 mg/dL (ref 8.9–10.3)
Chloride: 107 mmol/L (ref 98–111)
Creatinine, Ser: 1.84 mg/dL — ABNORMAL HIGH (ref 0.61–1.24)
GFR, Estimated: 43 mL/min — ABNORMAL LOW (ref >60)
Glucose, Bld: 95 mg/dL (ref 70–99)
Potassium: 4.3 mmol/L (ref 3.5–5.1)
Sodium: 137 mmol/L (ref 135–145)
Total Bilirubin: 0.8 mg/dL (ref 0.3–1.2)
Total Protein: 7.6 g/dL (ref 6.5–8.1)

## 2021-06-13 LAB — CBC WITH DIFFERENTIAL/PLATELET
Abs Immature Granulocytes: 0.04 10*3/uL (ref 0.00–0.07)
Basophils Absolute: 0 10*3/uL (ref 0.0–0.1)
Basophils Relative: 1 %
Eosinophils Absolute: 0.3 10*3/uL (ref 0.0–0.5)
Eosinophils Relative: 5 %
HCT: 38.1 % — ABNORMAL LOW (ref 39.0–52.0)
Hemoglobin: 12.7 g/dL — ABNORMAL LOW (ref 13.0–17.0)
Immature Granulocytes: 1 %
Lymphocytes Relative: 18 %
Lymphs Abs: 1.3 10*3/uL (ref 0.7–4.0)
MCH: 30.8 pg (ref 26.0–34.0)
MCHC: 33.3 g/dL (ref 30.0–36.0)
MCV: 92.3 fL (ref 80.0–100.0)
Monocytes Absolute: 0.6 10*3/uL (ref 0.1–1.0)
Monocytes Relative: 8 %
Neutro Abs: 4.9 10*3/uL (ref 1.7–7.7)
Neutrophils Relative %: 67 %
Platelets: 245 10*3/uL (ref 150–400)
RBC: 4.13 MIL/uL — ABNORMAL LOW (ref 4.22–5.81)
RDW: 14.4 % (ref 11.5–15.5)
WBC: 7.2 10*3/uL (ref 4.0–10.5)
nRBC: 0 % (ref 0.0–0.2)

## 2021-06-13 MED ORDER — HYDRALAZINE HCL 25 MG PO TABS
25.0000 mg | ORAL_TABLET | Freq: Once | ORAL | Status: AC
  Administered 2021-06-13: 25 mg via ORAL
  Filled 2021-06-13: qty 1

## 2021-06-13 MED ORDER — CLONIDINE HCL 0.1 MG PO TABS
0.1000 mg | ORAL_TABLET | Freq: Once | ORAL | Status: AC
  Administered 2021-06-13: 0.1 mg via ORAL
  Filled 2021-06-13: qty 1

## 2021-06-13 NOTE — Discharge Instructions (Addendum)
Return if any problems.  See your Physician for recheck  °

## 2021-06-13 NOTE — ED Triage Notes (Signed)
Pt c/o right leg swelling/pain for a couple of weeks. Pt had stopped taking lasix thinking that may be the cause however has not seen a decrease in swelling.  ?

## 2021-06-13 NOTE — ED Provider Notes (Signed)
?[facility] DEPT ?Provider Note ? ? ?CSN: [id number] ?Arrival date & time: 06/13/21  1513 ? ?  ? ?History ? ?Chief Complaint  ?Patient presents with  ? Leg Swelling  ? ? ?[name] [name] is a 56 y.o. male. ? ?Pt complains of swelling to his right lower leg.  Pt reports he has had increased swelling in his right leg for over a month,  Pt has been seen for the same.  Pt reports no relief with lasix.  Pt denies any fever or chills  ? ?The history is provided by the patient. No language interpreter was used.  ?Leg Pain ?Location:  Leg ?Leg location:  R leg ?Pain details:  ?  Quality:  Aching ?  Radiates to:  Does not radiate ?  Severity:  Moderate ?  Onset quality:  Gradual ?  Timing:  Constant ?  Progression:  Worsening ?Chronicity:  New ?Dislocation: no   ?Prior injury to area:  No ?Relieved by:  Nothing ?Worsened by:  Nothing ?Ineffective treatments:  None tried ?Associated symptoms: swelling   ?Associated symptoms: no back pain, no fever and no itching   ?Risk factors: no concern for non-accidental trauma and no recent illness   ? ?  ? ?Home Medications ?Prior to Admission medications   ?Medication Sig Start Date End Date Taking? Authorizing Provider  ?acetaminophen (TYLENOL) 500 MG tablet Take 1,000 mg by mouth every 6 (six) hours as needed for moderate pain or headache.    [provider]  ?allopurinol (ZYLOPRIM) 300 MG tablet Take 300 mg by mouth daily. 09/09/19   [provider]  ?amLODipine (NORVASC) 10 MG tablet Take 10 mg by mouth daily. 03/01/19   [provider]  ?blood glucose meter kit and supplies KIT Dispense based on patient and insurance preference. Use up to four times daily as directed. 05/06/20   Sponseller, Eugene Garnet R, PA-C  ?carvedilol (COREG) 6.25 MG tablet Take 12.5 mg by mouth 2 (two) times daily as needed. 05/04/20   [provider]  ?cloNIDine (CATAPRES) 0.1 MG tablet Take 0.1 mg by mouth 2 (two) times daily. 12/30/19   [provider]  ?doxycycline (MONODOX) 100 MG capsule Take 100 mg by mouth See admin instructions. Bid x 7 days 05/01/20   [provider]  ?furosemide (LASIX) 20 MG tablet Take 20 mg by mouth daily. 11/28/19   [provider]  ?GARLIC PO Take 1 capsule by mouth daily.    [provider]  ?glipiZIDE (GLUCOTROL) 5 MG tablet Take 0.5 tablets (2.5 mg total) by mouth daily before breakfast for 14 days. 05/06/20 05/20/20  Sponseller, Eugene Garnet R, PA-C  ?glucose blood test strip Use as instructed 05/06/20   Sponseller, Eugene Garnet R, PA-C  ?hydrALAZINE (APRESOLINE) 25 MG tablet Take 12.5 mg by mouth in the morning and at bedtime.    [provider]  ?metFORMIN (GLUCOPHAGE) 500 MG tablet Take 1 tablet (500 mg total) by mouth 2 (two) times daily with a meal. 05/04/20   Drenda Freeze, MD  ?olmesartan (BENICAR) 40 MG tablet Take 40 mg by mouth daily. 12/27/19   [provider]  ?   ? ?Allergies    ?Prednisone   ? ?Review of Systems   ?Review of Systems  ?Constitutional:  Negative for fever.  ?Respiratory:  Negative for cough and shortness of breath.   ?Cardiovascular:  Positive for leg swelling. Negative for chest pain and palpitations.  ?Musculoskeletal:  Negative for back pain.  ?Skin:  Negative  for itching.  ?All other systems reviewed and are negative. ? ?Physical Exam ?Updated Vital Signs ?BP (!) 184/98   Pulse 64   Temp 98.2 ?F (36.8 ?C) (Oral)   Resp 16   SpO2 97%  ?Physical Exam ?Vitals and nursing note reviewed.  ?Constitutional:   ?   General: He is not in acute distress. ?   Appearance: He is well-developed.  ?HENT:  ?   Head: Normocephalic and atraumatic.  ?Eyes:  ?   Conjunctiva/sclera: Conjunctivae normal.  ?Cardiovascular:  ?   Rate and Rhythm: Normal rate and regular rhythm.  ?   Heart sounds: No murmur heard. ?Pulmonary:  ?   Effort: Pulmonary effort is normal. No respiratory distress.  ?   Breath sounds: Normal breath sounds.  ?Abdominal:  ?   Tenderness: There is  abdominal tenderness.  ?Musculoskeletal:     ?   General: Swelling and tenderness present.  ?   Cervical back: Neck supple.  ?   Right lower leg: Edema present.  ?   Left lower leg: Edema present.  ?   Comments: Right and left leg swollen  right leg dark sin,  slight erythema    ?Skin: ?   General: Skin is warm and dry.  ?   Capillary Refill: Capillary refill takes less than 2 seconds.  ?Neurological:  ?   Mental Status: He is alert.  ?Psychiatric:     ?   Mood and Affect: Mood normal.  ? ? ?ED Results / Procedures / Treatments   ?Labs ?(all labs ordered are listed, but only abnormal results are displayed) ?Labs Reviewed  ?CBC WITH DIFFERENTIAL/PLATELET - Abnormal; Notable for the following components:  ?    Result Value  ? RBC 4.13 (*)   ? Hemoglobin 12.7 (*)   ? HCT 38.1 (*)   ? All other components within normal limits  ?COMPREHENSIVE METABOLIC PANEL - Abnormal; Notable for the following components:  ? BUN 26 (*)   ? Creatinine, Ser 1.84 (*)   ? GFR, Estimated 43 (*)   ? All other components within normal limits  ? ? ?EKG ?None ? ?Radiology ?No results found. ? ?Procedures ?Procedures  ? ? ?Medications Ordered in ED ?Medications  ?hydrALAZINE (APRESOLINE) tablet 25 mg (25 mg Oral Given 06/13/21 1928)  ?cloNIDine (CATAPRES) tablet 0.1 mg (0.1 mg Oral Given 06/13/21 1928)  ? ? ?ED Course/ Medical Decision Making/ A&P ?  ?                        ?Medical Decision Making ?Pt has swelling to bilat lower legs   ? ?Problems Addressed: ?Leg edema: acute illness or injury ? ?Amount and/or Complexity of Data Reviewed ?External Data Reviewed: notes. ?   Details: primary care notes reviewed. ?Labs: ordered. ? ?Risk ?Prescription drug management. ?Risk Details: Pt declined repeat ultrasound.  I doubt dvt.  I think pt swelling id due to edema and chronic venous insufficiency.   Pt advised to try ted hose/compression sock.  Pt had elevated blood pressure.  Pt given a dosage of clonidine and hydralazine.  Pt's blood pressure  improved.  Pt advised to continue home blood pressure management.   ? ? ? ? ? ? ? ? ? ? ?Final Clinical Impression(s) / ED Diagnoses ?Final diagnoses:  ?Hypertension, unspecified type  ?Leg edema  ?Venous stasis dermatitis of right lower extremity  ? ? ?Rx / DC Orders ?ED Discharge Orders   ? ? None  ? ?  ?  An After Visit Summary was printed and given to the patient. ? ? ?  ?Fransico Meadow, [place] ?06/13/21 2050 ? ?  ?Lianne Cure, DO ?65/78/46 2109 ? ?

## 2021-06-14 ENCOUNTER — Emergency Department (HOSPITAL_COMMUNITY)
Admission: EM | Admit: 2021-06-14 | Discharge: 2021-06-15 | Disposition: A | Discharge status: Home / Self Care | Payer: BC Managed Care – PPO | Attending: Emergency Medicine | Admitting: Emergency Medicine

## 2021-06-14 ENCOUNTER — Ambulatory Visit: Payer: Self-pay

## 2021-06-14 ENCOUNTER — Encounter (HOSPITAL_COMMUNITY): Payer: Self-pay | Admitting: Emergency Medicine

## 2021-06-14 DIAGNOSIS — D72829 Elevated white blood cell count, unspecified: Secondary | ICD-10-CM | POA: Diagnosis not present

## 2021-06-14 DIAGNOSIS — I110 Hypertensive heart disease with heart failure: Secondary | ICD-10-CM | POA: Diagnosis not present

## 2021-06-14 DIAGNOSIS — R600 Localized edema: Secondary | ICD-10-CM | POA: Insufficient documentation

## 2021-06-14 DIAGNOSIS — I509 Heart failure, unspecified: Secondary | ICD-10-CM | POA: Diagnosis not present

## 2021-06-14 DIAGNOSIS — R112 Nausea with vomiting, unspecified: Secondary | ICD-10-CM | POA: Insufficient documentation

## 2021-06-14 DIAGNOSIS — R42 Dizziness and giddiness: Secondary | ICD-10-CM

## 2021-06-14 DIAGNOSIS — I160 Hypertensive urgency: Secondary | ICD-10-CM

## 2021-06-14 DIAGNOSIS — R778 Other specified abnormalities of plasma proteins: Secondary | ICD-10-CM | POA: Diagnosis not present

## 2021-06-14 DIAGNOSIS — R7989 Other specified abnormal findings of blood chemistry: Secondary | ICD-10-CM | POA: Diagnosis not present

## 2021-06-14 DIAGNOSIS — Z79899 Other long term (current) drug therapy: Secondary | ICD-10-CM | POA: Insufficient documentation

## 2021-06-14 LAB — COMPREHENSIVE METABOLIC PANEL
ALT: 33 U/L (ref 0–44)
AST: 29 U/L (ref 15–41)
Albumin: 3.9 g/dL (ref 3.5–5.0)
Alkaline Phosphatase: 67 U/L (ref 38–126)
Anion gap: 7 (ref 5–15)
BUN: 23 mg/dL — ABNORMAL HIGH (ref 6–20)
CO2: 25 mmol/L (ref 22–32)
Calcium: 9.6 mg/dL (ref 8.9–10.3)
Chloride: 106 mmol/L (ref 98–111)
Creatinine, Ser: 1.71 mg/dL — ABNORMAL HIGH (ref 0.61–1.24)
GFR, Estimated: 47 mL/min — ABNORMAL LOW (ref >60)
Glucose, Bld: 137 mg/dL — ABNORMAL HIGH (ref 70–99)
Potassium: 4.3 mmol/L (ref 3.5–5.1)
Sodium: 138 mmol/L (ref 135–145)
Total Bilirubin: 1 mg/dL (ref 0.3–1.2)
Total Protein: 7.9 g/dL (ref 6.5–8.1)

## 2021-06-14 LAB — CBC WITH DIFFERENTIAL/PLATELET
Abs Immature Granulocytes: 0.08 10*3/uL — ABNORMAL HIGH (ref 0.00–0.07)
Basophils Absolute: 0 10*3/uL (ref 0.0–0.1)
Basophils Relative: 0 %
Eosinophils Absolute: 0 10*3/uL (ref 0.0–0.5)
Eosinophils Relative: 0 %
HCT: 43 % (ref 39.0–52.0)
Hemoglobin: 14.3 g/dL (ref 13.0–17.0)
Immature Granulocytes: 1 %
Lymphocytes Relative: 8 %
Lymphs Abs: 0.9 10*3/uL (ref 0.7–4.0)
MCH: 30.4 pg (ref 26.0–34.0)
MCHC: 33.3 g/dL (ref 30.0–36.0)
MCV: 91.3 fL (ref 80.0–100.0)
Monocytes Absolute: 0.5 10*3/uL (ref 0.1–1.0)
Monocytes Relative: 4 %
Neutro Abs: 10.1 10*3/uL — ABNORMAL HIGH (ref 1.7–7.7)
Neutrophils Relative %: 87 %
Platelets: 304 10*3/uL (ref 150–400)
RBC: 4.71 MIL/uL (ref 4.22–5.81)
RDW: 14.2 % (ref 11.5–15.5)
WBC: 11.6 10*3/uL — ABNORMAL HIGH (ref 4.0–10.5)
nRBC: 0 % (ref 0.0–0.2)

## 2021-06-14 LAB — TROPONIN I (HIGH SENSITIVITY): Troponin I (High Sensitivity): 21 ng/L — ABNORMAL HIGH (ref <18)

## 2021-06-14 LAB — LIPASE, BLOOD: Lipase: 30 U/L (ref 11–51)

## 2021-06-14 NOTE — ED Triage Notes (Signed)
Patient c/o N/V and dizziness since d/c yesterday.  ?

## 2021-06-14 NOTE — ED Provider Triage Note (Signed)
Emergency Medicine Provider Triage Evaluation Note ? ?[name] , a 56 y.o. male  was evaluated in triage.  Pt complains of dizzy. Dizzy and nauseous with vomiting and room spinning sensation throughout the day.  Seen yesterday for leg swelling, was given several BP medications.  Worsening sxs. No headache  ? ?Review of Systems  ?Positive: Dizzy, nausea, vomit, back pain, neck pain ?Negative: Fever, neck stiffness, focal weakness, cp, dysuria ? ?Physical Exam  ?BP (!) 182/124 (BP Location: Left Arm)   Pulse 81   Temp 98.1 ?F (36.7 ?C) (Oral)   Resp 18   SpO2 97%  ?Gen:   Awake, no distress   ?Resp:  Normal effort  ?MSK:   Moves extremities without difficulty  ?Other:   ? ?Medical Decision Making  ?Medically screening exam initiated at 9:49 PM.  Appropriate orders placed.  [name] [name] was informed that the remainder of the evaluation will be completed by another provider, this initial triage assessment does not replace that evaluation, and the importance of remaining in the ED until their evaluation is complete. ? ? ?  ?Fayrene Helper, PA-C ?06/14/21 2155 ? ?

## 2021-06-14 NOTE — Telephone Encounter (Signed)
?  Chief Complaint: HTN crisis ?Symptoms: BP 213/114, vomiting x 3, dizziness  ?Frequency: since midnight  ?Pertinent Negatives: NA ?Disposition: [x] ED /[] Urgent Care (no appt availability in office) / [] Appointment(In office/virtual)/ []  [facility]/ [] Home Care/ [] Refused Recommended Disposition /[] [facility]/ []  Follow-up with PCP ?Additional Notes: Pt's daughter calling in, obtained verbal permission from pt. states that pt went to ED yesterday d/t HTN and was given hydralazine and clonidine and sent home. Last night he was unable to get up by self to go to bathroom and since about 0400 has been vomiting and dizzy. Pt's BP was 213/114 at 0400. I advised her pt would need to back to ED to be re-evaluated. She has stairs that pt is afraid of coming down so advised her to call 911 and let EMS help transport and if pt refused to go by EMS she can transport but they could at least check pt out while they are there since symptoms are severe. She verbalized understanding and was going to call 911.  ? ?Reason for Disposition ? Sounds like a life-threatening emergency to the triager ? ?Answer Assessment - Initial Assessment Questions ?1. NAME of MEDICATION: "What medicine are you calling about?" ?    Clonidine and hydralazine  ?2. QUESTION: "What is your question?" (e.g., double dose of medicine, side effect) ?    Had meds given yesterday in ED and having HTN  ? ?4. SYMPTOMS: "Do you have any symptoms?" ?    BP elevated, vomiting and dizziness ?5. SEVERITY: If symptoms are present, ask "Are they mild, moderate or severe?" ?    severe ? ?Protocols used: Medication Question Call-A-AH, Blood Pressure - High-A-AH ? ?

## 2021-06-15 ENCOUNTER — Emergency Department (HOSPITAL_COMMUNITY): Payer: BC Managed Care – PPO

## 2021-06-15 MED ORDER — ONDANSETRON 4 MG PO TBDP: 4.0000 mg | ORAL_TABLET | Freq: Three times a day (TID) | ORAL | 0 refills | Status: DC | PRN

## 2021-06-15 MED ORDER — LOSARTAN POTASSIUM 25 MG PO TABS
100.0000 mg | ORAL_TABLET | Freq: Once | ORAL | Status: AC
  Administered 2021-06-15: 100 mg via ORAL
  Filled 2021-06-15: qty 4

## 2021-06-15 MED ORDER — MECLIZINE HCL 25 MG PO TABS
25.0000 mg | ORAL_TABLET | Freq: Once | ORAL | Status: AC
  Administered 2021-06-15: 25 mg via ORAL
  Filled 2021-06-15: qty 1

## 2021-06-15 MED ORDER — HYDRALAZINE HCL 25 MG PO TABS
25.0000 mg | ORAL_TABLET | Freq: Once | ORAL | Status: AC
  Administered 2021-06-15: 25 mg via ORAL
  Filled 2021-06-15: qty 1

## 2021-06-15 MED ORDER — MECLIZINE HCL 25 MG PO TABS: 25.0000 mg | ORAL_TABLET | Freq: Three times a day (TID) | ORAL | 0 refills | Status: DC | PRN

## 2021-06-15 MED ORDER — ONDANSETRON 4 MG PO TBDP
4.0000 mg | ORAL_TABLET | Freq: Once | ORAL | Status: AC
Start: 2021-06-15 — End: 2021-06-15
  Administered 2021-06-15: 4 mg via ORAL
  Filled 2021-06-15: qty 1

## 2021-06-15 NOTE — Discharge Instructions (Addendum)
Your work-up today was reassuring.  You had an MRI of the brain done which did not show any concerning cause of your dizziness.  I have sent in nausea medication as well as meclizine which is for dizziness.  You can use meclizine as needed.  I recommend you call and follow-up with your primary care provider regarding the dizziness.  If you have any worsening or concerning symptoms please return to the emergency room for evaluation.  He did had improvement in your symptoms and you are able to walk in the room without assistance. ?Your blood pressure was also significantly elevated today.  Please take your home medications as prescribed.  You received a dose of losartan and hydralazine in the emergency room.  Follow-up with your primary care provider to have your blood pressure reevaluated and medications adjusted if needed. ?

## 2021-06-15 NOTE — ED Provider Notes (Signed)
?[facility] DEPT ?Provider Note ? ? ?CSN: [id number] ?Arrival date & time: 06/14/21  2122 ? ?  ? ?History ? ?Chief Complaint  ?Patient presents with  ? Emesis  ? Dizziness  ? ? ?[name] [name] is a 56 y.o. male. ? ?56 year old male with past medical history of CHF, hypertension, venous insufficiency presents today for evaluation of dizziness, nausea and vomiting ongoing for about 2 days.  Patient states he was evaluated in this emergency room 2 days ago and soon after discharge once he got home he had onset of severe dizziness associated with nausea and vomiting.  He states it was worse when he laid on his right side improved on the left side however as the time progressed his symptoms worsened and he was unable to ambulate without severe dizziness and required help from his daughter.  States he was unable to take any medicines due to severity of nausea.  Denies headache, chest pain, shortness of breath.  Was not able to take any of his medicines yesterday.  Reports history of vertigo couple years ago.  States symptoms with this episode are worse than previous vertigo episode. ? ?The history is provided by the patient. No language interpreter was used.  ? ?  ? ?Home Medications ?Prior to Admission medications   ?Medication Sig Start Date End Date Taking? Authorizing Provider  ?acetaminophen (TYLENOL) 500 MG tablet Take 1,000 mg by mouth every 6 (six) hours as needed for moderate pain or headache.   Yes [provider]  ?allopurinol (ZYLOPRIM) 300 MG tablet Take 300 mg by mouth daily. 09/09/19  Yes [provider]  ?Cholecalciferol (VITAMIN D3) 25 MCG (1000 UT) CAPS Take 1,000 Units by mouth daily. 04/22/21  Yes [provider]  ?colchicine 0.6 MG tablet TAKE 2 TABLETS BY MOUTH AT FIRST SIGN OF GOUT FLARE THEN TAKE ONE TABLET BY MOUTH 12 HOURS LATER. CONTINUE TAKING TAKE ONE TABLET BY MOUTH DAILY UNTIL TWO DAYS PAST SYMPTOM RESOLUTION 03/22/21  Yes [provider]  ?furosemide (LASIX) 20 MG tablet Take 20 mg by mouth daily. 11/28/19  Yes [provider]  ?GARLIC PO Take 1 tablet by mouth daily.   Yes [provider]  ?labetalol (NORMODYNE) 200 MG tablet Take 200 mg by mouth 2 (two) times daily. 04/13/21  Yes [provider]  ?losartan (COZAAR) 100 MG tablet Take 100 mg by mouth daily. 05/11/21  Yes [provider]  ?NIFEdipine (ADALAT CC) 30 MG 24 hr tablet Take 30 mg by mouth daily. 03/18/21  Yes [provider]  ?rosuvastatin (CRESTOR) 20 MG tablet Take 20 mg by mouth at bedtime. 04/18/21  Yes [provider]  ?blood glucose meter kit and supplies KIT Dispense based on patient and insurance preference. Use up to four times daily as directed. 05/06/20   Sponseller, Eugene Garnet R, PA-C  ?glucose blood test strip Use as instructed 05/06/20   Sponseller, Gypsy Balsam, PA-C  ?   ? ?Allergies    ?Hydralazine, Amlodipine, and Prednisone   ? ?Review of Systems   ?Review of Systems  ?HENT:  Negative for congestion and sore throat.   ?Eyes:  Negative for visual disturbance.  ?Respiratory:  Negative for cough and shortness of breath.   ?Gastrointestinal:  Positive for nausea and vomiting.  ?All other systems reviewed and are negative. ? ?Physical Exam ?Updated Vital Signs ?BP (!) 242/138   Pulse 94   Temp 98.1 ?F (36.7 ?C) (Oral)   Resp (!) 25   SpO2  98%  ?Physical Exam ?Vitals and nursing note reviewed.  ?Constitutional:   ?   General: He is not in acute distress. ?   Appearance: Normal appearance. He is obese. He is not ill-appearing.  ?HENT:  ?   Head: Normocephalic and atraumatic.  ?   Nose: Nose normal.  ?Eyes:  ?   General: No scleral icterus. ?   Extraocular Movements: Extraocular movements intact.  ?   Conjunctiva/sclera: Conjunctivae normal.  ?   Pupils: Pupils are equal, round, and reactive to light.  ?   Comments: Without nystagmus.  ?Cardiovascular:  ?   Rate and Rhythm: Normal rate and regular rhythm.  ?   Pulses: Normal  pulses.  ?Pulmonary:  ?   Effort: Pulmonary effort is normal. No respiratory distress.  ?   Breath sounds: Normal breath sounds. No wheezing or rales.  ?Abdominal:  ?   General: There is no distension.  ?   Tenderness: There is no abdominal tenderness. There is no right CVA tenderness, left CVA tenderness or guarding.  ?Musculoskeletal:     ?   General: Normal range of motion.  ?   Cervical back: Normal range of motion.  ?   Right lower leg: Edema (Trace pitting edema) present.  ?   Left lower leg: Edema (Trace pitting edema) present.  ?Skin: ?   General: Skin is warm and dry.  ?Neurological:  ?   General: No focal deficit present.  ?   Mental Status: He is alert. Mental status is at baseline.  ?   Comments: Bilateral upper and lower extremities with full range of motion and 5/5 strength in extensor and flexor muscle groups.  Cranial nerves III through XII intact.  Tongue midline.  Without facial droop, or dysarthria.  Sensation intact.  Dix-Hallpike negative.  ? ? ?ED Results / Procedures / Treatments   ?Labs ?(all labs ordered are listed, but only abnormal results are displayed) ?Labs Reviewed  ?CBC WITH DIFFERENTIAL/PLATELET - Abnormal; Notable for the following components:  ?    Result Value  ? WBC 11.6 (*)   ? Neutro Abs 10.1 (*)   ? Abs Immature Granulocytes 0.08 (*)   ? All other components within normal limits  ?COMPREHENSIVE METABOLIC PANEL - Abnormal; Notable for the following components:  ? Glucose, Bld 137 (*)   ? BUN 23 (*)   ? Creatinine, Ser 1.71 (*)   ? GFR, Estimated 47 (*)   ? All other components within normal limits  ?TROPONIN I (HIGH SENSITIVITY) - Abnormal; Notable for the following components:  ? Troponin I (High Sensitivity) 21 (*)   ? All other components within normal limits  ?LIPASE, BLOOD  ? ? ?EKG ?EKG Interpretation ? ?Date/Time:  Monday Jun 14 2021 22:18:58 EDT ?Ventricular Rate:  73 ?PR Interval:  177 ?QRS Duration: 159 ?QT Interval:  448 ?QTC Calculation: 494 ?R Axis:   96 ?Text  Interpretation: Sinus rhythm Ventricular premature complex Right bundle branch block Confirmed by Ripley Fraise [phone]) on 06/15/2021 4:05:23 AM ? ?Radiology ?No results found. ? ?Procedures ?Procedures  ? ? ?Medications Ordered in ED ?Medications  ?hydrALAZINE (APRESOLINE) tablet 25 mg (has no administration in time range)  ?losartan (COZAAR) tablet 100 mg (has no administration in time range)  ?meclizine (ANTIVERT) tablet 25 mg (25 mg Oral Given 06/15/21 0423)  ?ondansetron (ZOFRAN-ODT) disintegrating tablet 4 mg (4 mg Oral Given 06/15/21 0423)  ? ? ?ED Course/ Medical Decision Making/ A&P ?  ?                        ?  Medical Decision Making ?Amount and/or Complexity of Data Reviewed ?Radiology: ordered. ? ?Risk ?Prescription drug management. ? ? ?Medical Decision Making / ED Course ? ? ?This patient presents to the ED for concern of dizziness, nausea, vomiting, this involves an extensive number of treatment options, and is a complaint that carries with it a high risk of complications and morbidity.  The differential diagnosis includes hypertensive emergency, CVA, peripheral dizziness ? ?MDM: ?56 year old male with past medical history of CHF, hypertension, chronic venous insufficiency presents today for evaluation of dizziness of 2-day duration.  Patient states this started soon after his recent discharge from the emergency room.  States he has not been able to take his home medications yesterday.  Blood pressure is significantly elevated in the emergency room today.  Dix-Hallpike is negative.  Given significant dizziness, significantly elevated blood pressure.  He does report his dizziness has significantly improved since yesterday.  Orthostasis negative during my interview.  Per chart review patient has poorly controlled blood pressure at baseline.  Will evaluate with MRI to rule out CVA as a cause of symptoms.  We will provide patient with Zofran and meclizine.  CBC shows mild leukocytosis otherwise without acute  findings.  CMP shows creatinine of 1.71 which is around baseline otherwise without acute findings.  Troponin of 21 which is around patient's baseline.  Without chest pain.  Doubt ACS.  Lipase within normal.  EKG w

## 2021-06-15 NOTE — ED Notes (Signed)
unsuccessful draw for repeat trop ?

## 2021-06-15 NOTE — ED Notes (Signed)
Back from MRI.

## 2022-05-18 ENCOUNTER — Inpatient Hospital Stay (HOSPITAL_BASED_OUTPATIENT_CLINIC_OR_DEPARTMENT_OTHER)
Admission: EM | Admit: 2022-05-18 | Discharge: 2022-05-22 | DRG: 065 | Disposition: A | Discharge status: Home / Self Care | Payer: BC Managed Care – PPO | Attending: Family Medicine | Admitting: Family Medicine

## 2022-05-18 ENCOUNTER — Encounter (HOSPITAL_BASED_OUTPATIENT_CLINIC_OR_DEPARTMENT_OTHER): Payer: Self-pay | Admitting: Emergency Medicine

## 2022-05-18 ENCOUNTER — Emergency Department (HOSPITAL_BASED_OUTPATIENT_CLINIC_OR_DEPARTMENT_OTHER): Payer: BC Managed Care – PPO

## 2022-05-18 ENCOUNTER — Emergency Department (HOSPITAL_COMMUNITY): Payer: BC Managed Care – PPO

## 2022-05-18 ENCOUNTER — Other Ambulatory Visit: Payer: Self-pay

## 2022-05-18 DIAGNOSIS — I10 Essential (primary) hypertension: Principal | ICD-10-CM

## 2022-05-18 DIAGNOSIS — R7989 Other specified abnormal findings of blood chemistry: Secondary | ICD-10-CM

## 2022-05-18 DIAGNOSIS — E785 Hyperlipidemia, unspecified: Secondary | ICD-10-CM | POA: Diagnosis present

## 2022-05-18 DIAGNOSIS — R29700 NIHSS score 0: Secondary | ICD-10-CM | POA: Diagnosis present

## 2022-05-18 DIAGNOSIS — I6340 Cerebral infarction due to embolism of unspecified cerebral artery: Secondary | ICD-10-CM | POA: Diagnosis present

## 2022-05-18 DIAGNOSIS — I63512 Cerebral infarction due to unspecified occlusion or stenosis of left middle cerebral artery: Secondary | ICD-10-CM | POA: Diagnosis not present

## 2022-05-18 DIAGNOSIS — N179 Acute kidney failure, unspecified: Secondary | ICD-10-CM | POA: Diagnosis present

## 2022-05-18 DIAGNOSIS — I639 Cerebral infarction, unspecified: Secondary | ICD-10-CM

## 2022-05-18 DIAGNOSIS — Z634 Disappearance and death of family member: Secondary | ICD-10-CM | POA: Diagnosis not present

## 2022-05-18 DIAGNOSIS — E876 Hypokalemia: Secondary | ICD-10-CM | POA: Diagnosis present

## 2022-05-18 DIAGNOSIS — Z888 Allergy status to other drugs, medicaments and biological substances status: Secondary | ICD-10-CM | POA: Diagnosis not present

## 2022-05-18 DIAGNOSIS — I129 Hypertensive chronic kidney disease with stage 1 through stage 4 chronic kidney disease, or unspecified chronic kidney disease: Secondary | ICD-10-CM | POA: Diagnosis present

## 2022-05-18 DIAGNOSIS — Z713 Dietary counseling and surveillance: Secondary | ICD-10-CM | POA: Diagnosis not present

## 2022-05-18 DIAGNOSIS — Z9189 Other specified personal risk factors, not elsewhere classified: Secondary | ICD-10-CM

## 2022-05-18 DIAGNOSIS — I160 Hypertensive urgency: Secondary | ICD-10-CM | POA: Diagnosis not present

## 2022-05-18 DIAGNOSIS — Z9049 Acquired absence of other specified parts of digestive tract: Secondary | ICD-10-CM | POA: Diagnosis not present

## 2022-05-18 DIAGNOSIS — I161 Hypertensive emergency: Secondary | ICD-10-CM | POA: Diagnosis present

## 2022-05-18 DIAGNOSIS — Z79899 Other long term (current) drug therapy: Secondary | ICD-10-CM | POA: Diagnosis not present

## 2022-05-18 DIAGNOSIS — Z6841 Body Mass Index (BMI) 40.0 and over, adult: Secondary | ICD-10-CM | POA: Diagnosis not present

## 2022-05-18 DIAGNOSIS — R9431 Abnormal electrocardiogram [ECG] [EKG]: Secondary | ICD-10-CM | POA: Insufficient documentation

## 2022-05-18 DIAGNOSIS — N1832 Chronic kidney disease, stage 3b: Secondary | ICD-10-CM | POA: Diagnosis present

## 2022-05-18 DIAGNOSIS — R269 Unspecified abnormalities of gait and mobility: Secondary | ICD-10-CM | POA: Diagnosis present

## 2022-05-18 DIAGNOSIS — G4733 Obstructive sleep apnea (adult) (pediatric): Secondary | ICD-10-CM | POA: Diagnosis present

## 2022-05-18 DIAGNOSIS — I6389 Other cerebral infarction: Secondary | ICD-10-CM | POA: Diagnosis not present

## 2022-05-18 DIAGNOSIS — I6330 Cerebral infarction due to thrombosis of unspecified cerebral artery: Secondary | ICD-10-CM | POA: Diagnosis not present

## 2022-05-18 DIAGNOSIS — R42 Dizziness and giddiness: Secondary | ICD-10-CM | POA: Diagnosis not present

## 2022-05-18 DIAGNOSIS — M109 Gout, unspecified: Secondary | ICD-10-CM | POA: Diagnosis present

## 2022-05-18 LAB — CBC
HCT: 40.5 % (ref 39.0–52.0)
Hemoglobin: 13.4 g/dL (ref 13.0–17.0)
MCH: 29.7 pg (ref 26.0–34.0)
MCHC: 33.1 g/dL (ref 30.0–36.0)
MCV: 89.8 fL (ref 80.0–100.0)
Platelets: 205 10*3/uL (ref 150–400)
RBC: 4.51 MIL/uL (ref 4.22–5.81)
RDW: 13.8 % (ref 11.5–15.5)
WBC: 5.8 10*3/uL (ref 4.0–10.5)
nRBC: 0 % (ref 0.0–0.2)

## 2022-05-18 LAB — BASIC METABOLIC PANEL
Anion gap: 9 (ref 5–15)
BUN: 25 mg/dL — ABNORMAL HIGH (ref 6–20)
CO2: 22 mmol/L (ref 22–32)
Calcium: 9.8 mg/dL (ref 8.9–10.3)
Chloride: 105 mmol/L (ref 98–111)
Creatinine, Ser: 1.85 mg/dL — ABNORMAL HIGH (ref 0.61–1.24)
GFR, Estimated: 42 mL/min — ABNORMAL LOW (ref >60)
Glucose, Bld: 136 mg/dL — ABNORMAL HIGH (ref 70–99)
Potassium: 3.9 mmol/L (ref 3.5–5.1)
Sodium: 136 mmol/L (ref 135–145)

## 2022-05-18 LAB — URINALYSIS, ROUTINE W REFLEX MICROSCOPIC
Bacteria, UA: NONE SEEN
Bilirubin Urine: NEGATIVE
Glucose, UA: NEGATIVE mg/dL
Ketones, ur: NEGATIVE mg/dL
Leukocytes,Ua: NEGATIVE
Nitrite: NEGATIVE
Protein, ur: 30 mg/dL — AB
Specific Gravity, Urine: <1.005 — ABNORMAL LOW (ref 1.005–1.030)
pH: 6 (ref 5.0–8.0)

## 2022-05-18 LAB — HEPATIC FUNCTION PANEL
ALT: 18 U/L (ref 0–44)
AST: 26 U/L (ref 15–41)
Albumin: 3.9 g/dL (ref 3.5–5.0)
Alkaline Phosphatase: 59 U/L (ref 38–126)
Bilirubin, Direct: 0.1 mg/dL (ref 0.0–0.2)
Indirect Bilirubin: 0.8 mg/dL (ref 0.3–0.9)
Total Bilirubin: 0.9 mg/dL (ref 0.3–1.2)
Total Protein: 7.3 g/dL (ref 6.5–8.1)

## 2022-05-18 LAB — RAPID URINE DRUG SCREEN, HOSP PERFORMED
Amphetamines: NOT DETECTED
Barbiturates: NOT DETECTED
Benzodiazepines: NOT DETECTED
Cocaine: NOT DETECTED
Opiates: NOT DETECTED
Tetrahydrocannabinol: NOT DETECTED

## 2022-05-18 LAB — DIFFERENTIAL
Abs Immature Granulocytes: 0.02 10*3/uL (ref 0.00–0.07)
Basophils Absolute: 0 10*3/uL (ref 0.0–0.1)
Basophils Relative: 1 %
Eosinophils Absolute: 0.3 10*3/uL (ref 0.0–0.5)
Eosinophils Relative: 5 %
Immature Granulocytes: 0 %
Lymphocytes Relative: 14 %
Lymphs Abs: 0.8 10*3/uL (ref 0.7–4.0)
Monocytes Absolute: 0.5 10*3/uL (ref 0.1–1.0)
Monocytes Relative: 9 %
Neutro Abs: 3.9 10*3/uL (ref 1.7–7.7)
Neutrophils Relative %: 71 %

## 2022-05-18 LAB — APTT: aPTT: 30 seconds (ref 24–36)

## 2022-05-18 LAB — PROTIME-INR
INR: 0.9 (ref 0.8–1.2)
Prothrombin Time: 12.2 seconds (ref 11.4–15.2)

## 2022-05-18 LAB — ETHANOL: Alcohol, Ethyl (B): <10 mg/dL (ref <10)

## 2022-05-18 MED ORDER — SODIUM CHLORIDE 0.9 % IV SOLN: INTRAVENOUS | Status: DC

## 2022-05-18 MED ORDER — MECLIZINE HCL 25 MG PO TABS: 25.0000 mg | ORAL_TABLET | Freq: Three times a day (TID) | ORAL | Status: DC | PRN

## 2022-05-18 MED ORDER — ROSUVASTATIN CALCIUM 20 MG PO TABS
20.0000 mg | ORAL_TABLET | Freq: Every day | ORAL | Status: DC
  Administered 2022-05-19: 20 mg via ORAL
  Filled 2022-05-18: qty 1

## 2022-05-18 MED ORDER — ACETAMINOPHEN 160 MG/5ML PO SOLN: 650.0000 mg | ORAL | Status: DC | PRN

## 2022-05-18 MED ORDER — ACETAMINOPHEN 325 MG PO TABS: 650.0000 mg | ORAL_TABLET | ORAL | Status: DC | PRN

## 2022-05-18 MED ORDER — ACETAMINOPHEN 650 MG RE SUPP: 650.0000 mg | RECTAL | Status: DC | PRN

## 2022-05-18 MED ORDER — MELATONIN 3 MG PO TABS
3.0000 mg | ORAL_TABLET | Freq: Every day | ORAL | Status: DC
  Administered 2022-05-19 – 2022-05-21 (×4): 3 mg via ORAL
  Filled 2022-05-18 (×5): qty 1

## 2022-05-18 MED ORDER — SENNOSIDES-DOCUSATE SODIUM 8.6-50 MG PO TABS: 1.0000 | ORAL_TABLET | Freq: Every evening | ORAL | Status: DC | PRN

## 2022-05-18 MED ORDER — ALLOPURINOL 100 MG PO TABS
300.0000 mg | ORAL_TABLET | Freq: Every day | ORAL | Status: DC
  Administered 2022-05-20: 300 mg via ORAL
  Filled 2022-05-18: qty 3
  Filled 2022-05-18 (×2): qty 1

## 2022-05-18 MED ORDER — CLONIDINE HCL 0.2 MG PO TABS
0.2000 mg | ORAL_TABLET | Freq: Once | ORAL | Status: DC
  Filled 2022-05-18: qty 1

## 2022-05-18 MED ORDER — ONDANSETRON 4 MG PO TBDP: 4.0000 mg | ORAL_TABLET | Freq: Three times a day (TID) | ORAL | Status: DC | PRN

## 2022-05-18 MED ORDER — INSULIN ASPART 100 UNIT/ML IJ SOLN
0.0000 [IU] | Freq: Three times a day (TID) | INTRAMUSCULAR | Status: DC
  Administered 2022-05-19: 2 [IU] via SUBCUTANEOUS
  Administered 2022-05-20 – 2022-05-21 (×3): 3 [IU] via SUBCUTANEOUS
  Administered 2022-05-22 (×2): 2 [IU] via SUBCUTANEOUS

## 2022-05-18 MED ORDER — NICARDIPINE HCL IN NACL 20-0.86 MG/200ML-% IV SOLN
3.0000 mg/h | INTRAVENOUS | Status: DC
  Administered 2022-05-18: 5 mg/h via INTRAVENOUS
  Filled 2022-05-18: qty 200

## 2022-05-18 MED ORDER — STROKE: EARLY STAGES OF RECOVERY BOOK
Freq: Once | Status: AC
  Filled 2022-05-18: qty 1

## 2022-05-18 MED ORDER — VITAMIN D 25 MCG (1000 UNIT) PO TABS
1000.0000 [IU] | ORAL_TABLET | Freq: Every day | ORAL | Status: DC
  Administered 2022-05-19 – 2022-05-22 (×4): 1000 [IU] via ORAL
  Filled 2022-05-18 (×4): qty 1

## 2022-05-18 MED ORDER — LABETALOL HCL 5 MG/ML IV SOLN
10.0000 mg | Freq: Once | INTRAVENOUS | Status: AC
  Administered 2022-05-18: 10 mg via INTRAVENOUS
  Filled 2022-05-18: qty 4

## 2022-05-18 MED ORDER — CLONIDINE HCL 0.2 MG PO TABS
0.2000 mg | ORAL_TABLET | Freq: Once | ORAL | Status: AC
  Administered 2022-05-18: 0.2 mg via ORAL
  Filled 2022-05-18: qty 1

## 2022-05-18 MED ORDER — ENOXAPARIN SODIUM 80 MG/0.8ML IJ SOSY: 80.0000 mg | PREFILLED_SYRINGE | Freq: Every day | INTRAMUSCULAR | Status: DC

## 2022-05-18 MED ORDER — CLEVIDIPINE BUTYRATE 0.5 MG/ML IV EMUL
0.0000 mg/h | INTRAVENOUS | Status: DC
  Administered 2022-05-19: 3 mg/h via INTRAVENOUS
  Administered 2022-05-19: 2 mg/h via INTRAVENOUS
  Filled 2022-05-18: qty 100
  Filled 2022-05-18: qty 50

## 2022-05-18 MED ORDER — LABETALOL HCL 5 MG/ML IV SOLN
20.0000 mg | Freq: Once | INTRAVENOUS | Status: AC
  Administered 2022-05-18: 20 mg via INTRAVENOUS
  Filled 2022-05-18: qty 4

## 2022-05-18 MED ORDER — LABETALOL HCL 100 MG PO TABS
200.0000 mg | ORAL_TABLET | Freq: Once | ORAL | Status: AC
  Administered 2022-05-18: 200 mg via ORAL
  Filled 2022-05-18: qty 2

## 2022-05-18 NOTE — ED Provider Notes (Signed)
Care of patient received from prior provider at 6:18 PM, please see their note for complete H/P and care plan.  Received handoff per ED course.  Clinical Course as of 05/18/22 2326  Wed May 18, 2022  1508 Stable 3 YOF with a chief complaint of dizziness. MR pending. BP elevated being treated. Zackowski discussed with teleneuro [CC]  1957 Repeat BP [CC]    Clinical Course User Index [CC] Tretha Sciara, MD   CRITICAL CARE Performed by: Tretha Sciara Total critical care time: 95 minutes  Critical care time was exclusive of separately billable procedures and treating other patients.  Critical care was necessary to treat or prevent imminent or life-threatening deterioration.  Critical care was time spent personally by me on the following activities: development of treatment plan with patient and/or surrogate as well as nursing, discussions with consultants, evaluation of patient's response to treatment, examination of patient, obtaining history from patient or surrogate, ordering and performing treatments and interventions, ordering and review of laboratory studies, ordering and review of radiographic studies, pulse oximetry and re-evaluation of patient's condition.  Reassessment: I was called to bedside multiple times for continued management this patient.  He has an acute stroke on MRI.  He is grossly hypertensive in the 230s.  Failure of multiple antihypertensives. Consulted critical care who recommended floor level care.  Consulted family medicine who states that over the consulting care the patient requested ICU evaluation.     Tretha Sciara, MD 05/18/22 2352

## 2022-05-18 NOTE — ED Notes (Signed)
Notified Dr. Oswald Hillock that the patient's BP 170/80, Dr. Oswald Hillock ordered to decrease Nicardipine from 5mg  to 2.5mg  and continue to monitor

## 2022-05-18 NOTE — ED Notes (Signed)
[name] at [facility] will send transport for ED to [facility] transfer for MRI. Dr Godfrey Pick is accepting at [facility])

## 2022-05-18 NOTE — ED Provider Notes (Signed)
[facility] Provider Note   CSN: [id number] Arrival date & time: 05/18/22  [id number]     History  Chief Complaint  Patient presents with   Dizziness    [name] [name] is a 57 y.o. male.  Patient on Sunday started to feel dizzy.  Felt as if her balance was off a little difficulty walking.  This morning he woke with some room spinning that was more intense.  On Monday he felt a little bit better.  Still now feeling as if when he walks that he is off balance.  Has never had any symptoms like this before.  Past medical history sniffing for hypertension and gallbladder removal.  Patient is never used tobacco products.  Patient states that he is on labetalol 400 mg twice a day and on Benicar.  He took one of his labetalol was this morning.  Patient denies headache denies visual changes speech problems denies any weakness or numbness.  Denies any chest pain or shortness of breath.  Patient has been taking his blood pressure medicine as prescribed.  Chart review shows that labetalol may just be 200 mg twice a day.  Also no headache.       Home Medications Prior to Admission medications   Medication Sig Start Date End Date Taking? Authorizing Provider  acetaminophen (TYLENOL) 500 MG tablet Take 1,000 mg by mouth every 6 (six) hours as needed for moderate pain or headache.    [provider]  allopurinol (ZYLOPRIM) 300 MG tablet Take 300 mg by mouth daily. 09/09/19   [provider]  blood glucose meter kit and supplies KIT Dispense based on patient and insurance preference. Use up to four times daily as directed. 05/06/20   Sponseller, Gypsy Balsam, PA-C  Cholecalciferol (VITAMIN D3) 25 MCG (1000 UT) CAPS Take 1,000 Units by mouth daily. 04/22/21   [provider]  colchicine 0.6 MG tablet Take 1.2 mg by mouth See admin instructions. Take 2 tablets by mouth at the first sign of gout flare then take one tablet by mouth 12 hours later. Continue  to take 1 tablet by mouth daily until 2 days past symptom resolution 03/22/21   [provider]  furosemide (LASIX) 20 MG tablet Take 20 mg by mouth daily. 11/28/19   [provider]  GARLIC PO Take 1 tablet by mouth daily.    [provider]  glucose blood test strip Use as instructed 05/06/20   Sponseller, Rebekah R, PA-C  labetalol (NORMODYNE) 200 MG tablet Take 200 mg by mouth 2 (two) times daily. 04/13/21   [provider]  losartan (COZAAR) 100 MG tablet Take 100 mg by mouth daily. 05/11/21   [provider]  meclizine (ANTIVERT) 25 MG tablet Take 1 tablet (25 mg total) by mouth 3 (three) times daily as needed for dizziness. 06/15/21   Evlyn Courier, PA-C  NIFEdipine (ADALAT CC) 30 MG 24 hr tablet Take 30 mg by mouth daily. 03/18/21   [provider]  ondansetron (ZOFRAN-ODT) 4 MG disintegrating tablet Take 1 tablet (4 mg total) by mouth every 8 (eight) hours as needed. 06/15/21   Deatra Canter, Amjad, PA-C  rosuvastatin (CRESTOR) 20 MG tablet Take 20 mg by mouth at bedtime. 04/18/21   [provider]      Allergies    Hydralazine, Amlodipine, and Prednisone    Review of Systems   Review of Systems  Constitutional:  Negative for chills and fever.  HENT:  Negative for ear  pain and sore throat.   Eyes:  Negative for pain and visual disturbance.  Respiratory:  Negative for cough and shortness of breath.   Cardiovascular:  Negative for chest pain and palpitations.  Gastrointestinal:  Negative for abdominal pain and vomiting.  Genitourinary:  Negative for dysuria and hematuria.  Musculoskeletal:  Negative for arthralgias and back pain.  Skin:  Negative for color change and rash.  Neurological:  Positive for dizziness. Negative for seizures, syncope, facial asymmetry, speech difficulty, weakness, light-headedness, numbness and headaches.  Hematological:  Does not bruise/bleed easily.  All other systems reviewed and are negative.   Physical  Exam Updated Vital Signs BP (!) 198/94   Pulse (!) 56   Temp 98.4 F (36.9 C) (Oral)   Resp 10   Ht 1.829 m (6')   Wt (!) 166.5 kg   SpO2 93%   BMI 49.78 kg/m  Physical Exam Vitals and nursing note reviewed.  Constitutional:      General: He is not in acute distress.    Appearance: Normal appearance. He is well-developed.  HENT:     Head: Normocephalic and atraumatic.  Eyes:     Extraocular Movements: Extraocular movements intact.     Conjunctiva/sclera: Conjunctivae normal.     Pupils: Pupils are equal, round, and reactive to light.  Cardiovascular:     Rate and Rhythm: Normal rate and regular rhythm.     Heart sounds: No murmur heard. Pulmonary:     Effort: Pulmonary effort is normal. No respiratory distress.     Breath sounds: Normal breath sounds.  Abdominal:     Palpations: Abdomen is soft.     Tenderness: There is no abdominal tenderness.  Musculoskeletal:        General: No swelling.     Cervical back: Normal range of motion and neck supple.  Skin:    General: Skin is warm and dry.     Capillary Refill: Capillary refill takes less than 2 seconds.  Neurological:     Mental Status: He is alert and oriented to person, place, and time.     Cranial Nerves: No cranial nerve deficit.     Sensory: No sensory deficit.     Motor: No weakness.     Coordination: Coordination normal.     Gait: Gait abnormal.  Psychiatric:        Mood and Affect: Mood normal.     ED Results / Procedures / Treatments   Labs (all labs ordered are listed, but only abnormal results are displayed) Labs Reviewed  BASIC METABOLIC PANEL - Abnormal; Notable for the following components:      Result Value   Glucose, Bld 136 (*)    BUN 25 (*)    Creatinine, Ser 1.85 (*)    GFR, Estimated 42 (*)    All other components within normal limits  URINALYSIS, ROUTINE W REFLEX MICROSCOPIC - Abnormal; Notable for the following components:   Color, Urine COLORLESS (*)    Specific Gravity, Urine  <1.005 (*)    Hgb urine dipstick SMALL (*)    Protein, ur 30 (*)    All other components within normal limits  CBC  ETHANOL  PROTIME-INR  APTT  DIFFERENTIAL  RAPID URINE DRUG SCREEN, HOSP PERFORMED  HEPATIC FUNCTION PANEL    EKG EKG Interpretation  Date/Time:  Wednesday May 18 2022 08:53:49 EDT Ventricular Rate:  63 PR Interval:  189 QRS Duration: 179 QT Interval:  452 QTC Calculation: 463 R Axis:   110 Text  Interpretation: Sinus rhythm Probable left atrial enlargement Right bundle branch block No significant change since last tracing Confirmed by Fredia Sorrow [phone]) on 05/18/2022 10:18:13 AM  Radiology CT HEAD WO CONTRAST  Result Date: 05/18/2022 CLINICAL DATA:  57 year old male with dizziness for 4 days. EXAM: CT HEAD WITHOUT CONTRAST TECHNIQUE: Contiguous axial images were obtained from the base of the skull through the vertex without intravenous contrast. RADIATION DOSE REDUCTION: This exam was performed according to the departmental dose-optimization program which includes automated exposure control, adjustment of the mA and/or kV according to patient size and/or use of iterative reconstruction technique. COMPARISON:  Brain MRI 06/15/2021. FINDINGS: Brain: Stable cerebral volume from the MRI last year. No midline shift, ventriculomegaly, mass effect, evidence of mass lesion, intracranial hemorrhage or evidence of cortically based acute infarction. Gray-white matter differentiation is within normal limits throughout the brain. Vascular: Mild calcified atherosclerosis at the skull base. No suspicious intracranial vascular hyperdensity. Skull: Negative. Sinuses/Orbits: Tympanic cavities, visualized paranasal sinuses, and mastoids are stable and well aerated. Other: No acute orbit or scalp soft tissue finding. IMPRESSION: Normal for age non contrast CT appearance of the brain, appears stable to MRI last year. Electronically Signed   By: Genevie Ann M.D.   On: 05/18/2022 09:41     Procedures Procedures    Medications Ordered in ED Medications  labetalol (NORMODYNE) tablet 200 mg (200 mg Oral Given 05/18/22 [id number])    ED Course/ Medical Decision Making/ A&P                             Medical Decision Making Amount and/or Complexity of Data Reviewed Labs: ordered. Radiology: ordered.  Risk Prescription drug management.   Patient still feel an offkilter on his gait.  No current vertigo.  But has had dizziness gait being off balance and intermittent vertigo since Sunday.  Worse vertigo this morning but now is resolved.  No definite neuro deficits on exam.  Head CT negative.  Lab workup without significant findings.  Urinalysis drug screen negative urinalysis negative for urinary tract infection basic metabolic panel blood sugar 136 GFR 42 but not significantly changed from baseline.  Hepatic function panel is pending.  Patient's blood pressure was about 123456 systolic here.  We did give him his morning blood pressure medicines.  Blood pressure now 198.  Seems as if his blood pressure is somewhat resistant.  Will hold that for now until we have MRI to rule out CVA.  Patient is on his blood pressure medicine he has not run out of that.  Discussed with Dr. Rory Percy patient will be transferred into [facility] for MRI.  Dr. Doren Custard  accepting.  This positionally based on MRI.  CRITICAL CARE Performed by: Fredia Sorrow Total critical care time: 40 minutes Critical care time was exclusive of separately billable procedures and treating other patients. Critical care was necessary to treat or prevent imminent or life-threatening deterioration. Critical care was time spent personally by me on the following activities: development of treatment plan with patient and/or surrogate as well as nursing, discussions with consultants, evaluation of patient's response to treatment, examination of patient, obtaining history from patient or surrogate, ordering and performing treatments and  interventions, ordering and review of laboratory studies, ordering and review of radiographic studies, pulse oximetry and re-evaluation of patient's condition.   Final Clinical Impression(s) / ED Diagnoses Final diagnoses:  Primary hypertension  Dizziness  Cerebrovascular accident (CVA), unspecified mechanism    Rx /  DC Orders ED Discharge Orders     None         Fredia Sorrow, MD 05/18/22 1126

## 2022-05-18 NOTE — H&P (Addendum)
NAME:  [name] [name], MRN:  [id number], DOB:  06-Jun-1965, LOS: 0 ADMISSION DATE:  05/18/2022,  ADMISSION DATE:  05/18/22 REFERRING MD:  Oswald Hillock, CHIEF COMPLAINT:  dizziness   History of Present Illness:  This is a very pleasant 57 year old man with a history of sleep apnea, hypertension, gout presenting with 3 days of persistent dizziness and vertigo.  No focal weakness, no trouble swallowing or speaking, no numbness, no tingling.  Does have a history of self-limited dizziness which he initially thought this was but due to persistence prompted him to visit the emergency room.  Head CT negative but MRI shows infarcts in the right frontal and temporal parietal cortex as well as a subacute on the left lentiform nucleus.  Patient's blood pressure noted to be severely elevated.  He was started on a Cardene drip and after calls to FMTS and neurology, ultimately PCCM to admit.  Patient admits to stress recently after his brother died about a year ago from cancer.  He is understandably distraught over his diagnosis of a stroke.  He has been taking all of his medicines as prescribed including multiple antihypertensives.  Pertinent  Medical History  Hypertension Obesity  CKD OSA  Significant Hospital Events: Including procedures, antibiotic start and stop dates in addition to other pertinent events     Interim History / Subjective:  Admitting  Objective   Blood pressure (!) 220/113, pulse (!) 59, temperature 98 F (36.7 C), temperature source Oral, resp. rate 11, height 6' (1.829 m), weight (!) 166.5 kg, SpO2 97 %.       No intake or output data in the 24 hours ending 05/18/22 2344 Filed Weights   05/18/22 0848  Weight: (!) 166.5 kg    Examination: General: No acute distress HENT: Mallampati 4 Lungs: Lungs are clear, no accessory muscle use Cardiovascular: Heart sounds regular, sinus bradycardia on monitor Abdomen: Abdomen is soft, positive bowel sounds Extremities: There is no  edema Neuro: He has equal strength, cranial nerves appear to be intact, extraocular muscles are intact, no focal numbness.  For a more detailed exam please see neurology's note Skin: No rashes  BMP stable creatinine elevation CBC benign MRI report report reviewed  Resolved Hospital Problem list   Not applicable  Assessment & Plan:  Acute stroke, small burden, embolic possible Hx HTN, OSA on CPAP, CKD BMI 50  - Cardene permissive HTN < 220/110 1st 24h then reduce thereafter to goal 140/80 - Echo, MRA vs. Carotids per neuro discretion - PT/OT/SLP although if passes bedside fine for diet from my end - qHS CPAP - Med rec performed - Defer antiplatelets and DVT ppx to neurology  Best Practice (right click and "Reselect all SmartList Selections" daily)   Diet/type: Regular consistency (see orders) DVT prophylaxis: per neuro GI prophylaxis: N/A Lines: N/A Foley:  N/A Code Status:  full code Last date of multidisciplinary goals of care discussion [n/a]  Labs   CBC: Recent Labs  Lab 05/18/22 0851 05/18/22 0906  WBC 5.8  --   NEUTROABS  --  3.9  HGB 13.4  --   HCT 40.5  --   MCV 89.8  --   PLT 205  --     Basic Metabolic Panel: Recent Labs  Lab 05/18/22 0851  NA 136  K 3.9  CL 105  CO2 22  GLUCOSE 136*  BUN 25*  CREATININE 1.85*  CALCIUM 9.8   GFR: Estimated Creatinine Clearance: 71.4 mL/min (A) (by C-G formula based on SCr of 1.85  mg/dL (H)). Recent Labs  Lab 05/18/22 0851  WBC 5.8    Liver Function Tests: Recent Labs  Lab 05/18/22 0851  AST 26  ALT 18  ALKPHOS 59  BILITOT 0.9  PROT 7.3  ALBUMIN 3.9   No results for input(s): "LIPASE", "AMYLASE" in the last 168 hours. No results for input(s): "AMMONIA" in the last 168 hours.  ABG No results found for: "PHART", "PCO2ART", "PO2ART", "HCO3", "TCO2", "ACIDBASEDEF", "O2SAT"   Coagulation Profile: Recent Labs  Lab 05/18/22 0906  INR 0.9    Cardiac Enzymes: No results for input(s):  "CKTOTAL", "CKMB", "CKMBINDEX", "TROPONINI" in the last 168 hours.  HbA1C: No results found for: "HGBA1C"  CBG: No results for input(s): "GLUCAP" in the last 168 hours.  Review of Systems:    Positive Symptoms in bold:  Constitutional fevers, chills, weight loss, fatigue, anorexia, malaise  Eyes decreased vision, double vision, eye irritation  Ears, Nose, Mouth, Throat sore throat, trouble swallowing, sinus congestion  Cardiovascular chest pain, paroxysmal nocturnal dyspnea, lower ext edema, palpitations   Respiratory SOB, cough, DOE, hemoptysis, wheezing  Gastrointestinal nausea, vomiting, diarrhea  Genitourinary burning with urination, trouble urinating  Musculoskeletal joint aches, joint swelling, back pain  Integumentary  rashes, skin lesions  Neurological focal weakness, focal numbness, trouble speaking, headaches  Psychiatric depression, anxiety, confusion  Endocrine polyuria, polydipsia, cold intolerance, heat intolerance  Hematologic abnormal bruising, abnormal bleeding, unexplained nose bleeds  Allergic/Immunologic recurrent infections, hives, swollen lymph nodes     Past Medical History:  He,  has a past medical history of Hypertension.   Surgical History:   Past Surgical History:  Procedure Laterality Date   CHOLECYSTECTOMY       Social History:   reports that he has never smoked. He has never used smokeless tobacco. He reports that he does not drink alcohol.   Family History:  His family history is not on file.   Allergies Allergies  Allergen Reactions   Hydralazine Palpitations    Per patient medication makes his heart feel like it is pounding   Amlodipine Rash   Prednisone Hives     Home Medications  Prior to Admission medications   Medication Sig Start Date End Date Taking? Authorizing Provider  acetaminophen (TYLENOL) 500 MG tablet Take 1,000 mg by mouth every 6 (six) hours as needed for moderate pain or headache.    [provider]   allopurinol (ZYLOPRIM) 300 MG tablet Take 300 mg by mouth daily. 09/09/19   [provider]  blood glucose meter kit and supplies KIT Dispense based on patient and insurance preference. Use up to four times daily as directed. 05/06/20   Sponseller, Gypsy Balsam, PA-C  Cholecalciferol (VITAMIN D3) 25 MCG (1000 UT) CAPS Take 1,000 Units by mouth daily. 04/22/21   [provider]  colchicine 0.6 MG tablet Take 1.2 mg by mouth See admin instructions. Take 2 tablets by mouth at the first sign of gout flare then take one tablet by mouth 12 hours later. Continue to take 1 tablet by mouth daily until 2 days past symptom resolution 03/22/21   [provider]  furosemide (LASIX) 20 MG tablet Take 20 mg by mouth daily. 11/28/19   [provider]  GARLIC PO Take 1 tablet by mouth daily.    [provider]  glucose blood test strip Use as instructed 05/06/20   Sponseller, Rebekah R, PA-C  labetalol (NORMODYNE) 200 MG tablet Take 200 mg by mouth 2 (two) times daily. 04/13/21   [provider]  losartan (COZAAR) 100 MG tablet Take 100 mg by mouth daily. 05/11/21   [provider]  meclizine (ANTIVERT) 25 MG tablet Take 1 tablet (25 mg total) by mouth 3 (three) times daily as needed for dizziness. 06/15/21   Evlyn Courier, PA-C  NIFEdipine (ADALAT CC) 30 MG 24 hr tablet Take 30 mg by mouth daily. 03/18/21   [provider]  ondansetron (ZOFRAN-ODT) 4 MG disintegrating tablet Take 1 tablet (4 mg total) by mouth every 8 (eight) hours as needed. 06/15/21   Deatra Canter, Amjad, PA-C  rosuvastatin (CRESTOR) 20 MG tablet Take 20 mg by mouth at bedtime. 04/18/21   [provider]     Critical care time: 31 min cc time independent of procedures

## 2022-05-18 NOTE — ED Notes (Signed)
Report given to the next RN... 

## 2022-05-18 NOTE — ED Notes (Signed)
Notified Dr Oswald Hillock of patient's BP 220/113. Dr. Oswald Hillock to enter new orders

## 2022-05-18 NOTE — ED Notes (Signed)
Notified Charge, Claiborne Billings, RN nurse for patient's need for Cardene drip to be administered and need for a monitored room for elevated BP

## 2022-05-18 NOTE — Consult Note (Deleted)
FMTS Interim Progress Note  S: We were paged by ED for admission.  Patient is a CVA workup and neurology is on board.  We reviewed patient's chart and prepared for admission.  However, we were notified by ED provider that patient is on nicardipine drip.   We discussed with Charge RN on progressive unit and were notified this will need to be ICU level of care.  We recommended the ED provider reach out to CCM for ICU admission.  O: BP (!) 220/113   Pulse (!) 59   Temp 98 F (36.7 C) (Oral)   Resp 11   Ht 6' (1.829 m)   Wt (!) 166.5 kg   SpO2 97%   BMI 49.78 kg/m    A/P: Acute CVA with systolic pressures greater than 220, requiring nicardipine drip. Requires ICU LOC for cardene drip.  Leslie Dales, DO 05/18/2022, 11:26 PM PGY-1, [place] [facility] Service pager [phone]   Agree with documentation as above by Dr. Lady Deutscher, DO PGY-2 [place]

## 2022-05-18 NOTE — ED Provider Notes (Signed)
Care of patient assumed in transfer from [facility] from Dr. Rogene Houston.  This patient presented for 3 days of dizziness.  Symptoms intensified this morning.  She has had mild imbalance with walking.  Blood pressure has been elevated.  He received labetalol at 921 this morning.  Noncontrasted CT scan was negative.  Case was discussed with neurology who recommended MRI. Physical Exam  BP (!) 212/104   Pulse (!) 55   Temp 98.4 F (36.9 C) (Oral)   Resp 13   Ht 6' (1.829 m)   Wt (!) 166.5 kg   SpO2 97%   BMI 49.78 kg/m   Physical Exam Vitals and nursing note reviewed.  Constitutional:      General: He is not in acute distress.    Appearance: Normal appearance. He is well-developed. He is not ill-appearing, toxic-appearing or diaphoretic.  HENT:     Head: Normocephalic and atraumatic.     Right Ear: External ear normal.     Left Ear: External ear normal.     Nose: Nose normal.     Mouth/Throat:     Mouth: Mucous membranes are moist.  Eyes:     Extraocular Movements: Extraocular movements intact.     Conjunctiva/sclera: Conjunctivae normal.  Cardiovascular:     Rate and Rhythm: Normal rate and regular rhythm.  Pulmonary:     Effort: Pulmonary effort is normal. No respiratory distress.  Abdominal:     General: There is no distension.     Palpations: Abdomen is soft.  Musculoskeletal:        General: No swelling. Normal range of motion.     Cervical back: Normal range of motion and neck supple.  Skin:    General: Skin is warm and dry.     Coloration: Skin is not jaundiced or pale.  Neurological:     General: No focal deficit present.     Mental Status: He is alert and oriented to person, place, and time.     Cranial Nerves: No cranial nerve deficit.     Sensory: No sensory deficit.     Motor: No weakness.     Coordination: Coordination normal.  Psychiatric:        Mood and Affect: Mood normal.        Behavior: Behavior normal.        Thought Content: Thought  content normal.        Judgment: Judgment normal.     Procedures  Procedures  ED Course / MDM   Clinical Course as of 05/18/22 1523  Wed May 18, 2022  1508 Stable 33 YOF with a chief complaint of dizziness. MR pending. BP elevated being treated. Zackowski discussed with teleneuro [CC]    Clinical Course User Index [CC] Tretha Sciara, MD   Medical Decision Making Amount and/or Complexity of Data Reviewed Labs: ordered. Radiology: ordered.  Risk Prescription drug management.   Patient is well-appearing on arrival.   Currently, at rest, dizziness is resolved.   Vital signs are notable for hypertension. He did receive oral labetalol this morning.  Given concern for posterior CVA, will allow permissive hypertension.  He was given IV labetalol to bring his pressures down gently.  At time of signout, patient waiting MRI.  Care patient signed out to oncoming ED provider.       Godfrey Pick, MD 05/18/22 [phone]

## 2022-05-18 NOTE — ED Triage Notes (Signed)
Pt arrives to ED with c/o dizziness x4 days that worsened today.

## 2022-05-18 NOTE — ED Triage Notes (Signed)
Pt BIB [facility] from [facility] due to dizziness and headache for the past couple days.  Pt VS BP 214/119, HR 60, Spo2 96% RA.  NIH 0.  Pt given 200mg  labetalol at 0921 at [facility] ED.  Transferred for MRI.

## 2022-05-18 NOTE — ED Notes (Signed)
[facility] at bedside 

## 2022-05-18 NOTE — Progress Notes (Signed)
FMTS Interim Progress Note   S: We were paged by ED for admission.  Patient is a CVA workup and neurology is on board.  We reviewed patient's chart and prepared for admission.  However, we were notified by ED provider that patient is on nicardipine drip.   We discussed with Charge RN on progressive unit and were notified this will need to be ICU level of care.  We recommended the ED provider reach out to CCM for ICU admission.   O: BP (!) 220/113   Pulse (!) 59   Temp 98 F (36.7 C) (Oral)   Resp 11   Ht 6' (1.829 m)   Wt (!) 166.5 kg   SpO2 97%   BMI 49.78 kg/m     A/P: Acute CVA with systolic pressures greater than 220, requiring nicardipine drip. Requires ICU LOC for cardene drip.   Leslie Dales, DO 05/18/2022, 11:26 PM PGY-1, [place] [facility] Service pager [phone]     Agree with documentation as above by Dr. Lady Deutscher, DO PGY-2 [place]

## 2022-05-18 NOTE — ED Notes (Signed)
Patient transported to MRI 

## 2022-05-18 NOTE — Consult Note (Signed)
NEURO HOSPITALIST CONSULT NOTE   Requesting physician: Dr. Tamala Julian  Reason for Consult: Acute ischemic infarctions on MRI  History obtained from:  Patient and Chart     HPI:                                                                                                                                          [name] [name] is an 57 y.o. male with a PMHx of HTN and obesity who presented to the [facility] ED on Wednesday morning with a c/c of dizziness for the past 4 days that then worsened to the extent that he felt it prudent to be evaluated in the ED. He states that his dizziness felt like a combination of gait unsteadiness in conjunction with movement-induced vertigo. He did not have any associated limb weakness but did feel that he was walking as though drunk. No headache, SOB or CP. There was no tendency to drift towards one side while ambulating. NIHSS on arrival to the ED was 0. He is on labetalol 400 mg twice a day in addition to Benicar. Blood pressures were severely elevated in the ED and he was started on a Cardene drip. CT in the ED at [facility] was unremarkable.   He was then transferred to [facility] for MRI, which revealed punctate acute infarcts in the right inferomedial frontal lobe and temporoparietal cortex, in addition to a likely subacute infarct in the left lentiform nucleus.    Past Medical History:  Diagnosis Date   Hypertension     Past Surgical History:  Procedure Laterality Date   CHOLECYSTECTOMY      History reviewed. No pertinent family history.           Social History:  reports that he has never smoked. He has never used smokeless tobacco. He reports that he does not drink alcohol. No history on file for drug use.  Allergies  Allergen Reactions   Hydralazine Palpitations    Per patient medication makes his heart feel like it is pounding   Amlodipine Rash   Prednisone Hives    MEDICATIONS:  No current facility-administered medications on file prior to encounter.   Current Outpatient Medications on File Prior to Encounter  Medication Sig Dispense Refill   acetaminophen (TYLENOL) 500 MG tablet Take 1,000 mg by mouth every 6 (six) hours as needed for moderate pain or headache.     allopurinol (ZYLOPRIM) 300 MG tablet Take 300 mg by mouth daily.     blood glucose meter kit and supplies KIT Dispense based on patient and insurance preference. Use up to four times daily as directed. 1 each 0   Cholecalciferol (VITAMIN D3) 25 MCG (1000 UT) CAPS Take 1,000 Units by mouth daily.     colchicine 0.6 MG tablet Take 1.2 mg by mouth See admin instructions. Take 2 tablets by mouth at the first sign of gout flare then take one tablet by mouth 12 hours later. Continue to take 1 tablet by mouth daily until 2 days past symptom resolution     furosemide (LASIX) 20 MG tablet Take 20 mg by mouth daily.     GARLIC PO Take 1 tablet by mouth daily.     glucose blood test strip Use as instructed 100 each 12   labetalol (NORMODYNE) 200 MG tablet Take 200 mg by mouth 2 (two) times daily.     losartan (COZAAR) 100 MG tablet Take 100 mg by mouth daily.     meclizine (ANTIVERT) 25 MG tablet Take 1 tablet (25 mg total) by mouth 3 (three) times daily as needed for dizziness. 30 tablet 0   NIFEdipine (ADALAT CC) 30 MG 24 hr tablet Take 30 mg by mouth daily.     ondansetron (ZOFRAN-ODT) 4 MG disintegrating tablet Take 1 tablet (4 mg total) by mouth every 8 (eight) hours as needed. 20 tablet 0   rosuvastatin (CRESTOR) 20 MG tablet Take 20 mg by mouth at bedtime.       ROS:                                                                                                                                       No vision loss, dysphasia, confusion, limb weakness, sensory loss or limb ataxia endorsed by the patient. Other ROS as per HPI.    Blood pressure (!) 232/112,  pulse (!) 59, temperature 98.2 F (36.8 C), temperature source Oral, resp. rate 18, height 6' (1.829 m), weight (!) 166.5 kg, SpO2 100 %.   General Examination:  Physical Exam  HEENT-  [place]/AT    Lungs- Respirations unlabored Extremities- Nonpitting edema and chronic pigmentary changes to distal BLE.    Neurological Examination Mental Status: Alert, oriented x 5, thought content appropriate.  Speech fluent without evidence of aphasia.  Able to follow all commands without difficulty. Cranial Nerves: II: Temporal visual fields intact with no extinction to DSS. PERRL  III,IV, VI: No ptosis. EOMI.  V: Temp sensation equal bilaterally  VII: Smile symmetric VIII: Hearing intact to voice IX,X: No hoarseness XI: Symmetric shoulder shrug XII: Midline tongue extension Motor: BUE 5/5 proximally and distally BLE 5/5 proximally and distally  No pronator drift.  Sensory: Temp and light touch intact throughout, bilaterally. No extinction to DSS.  Deep Tendon Reflexes: 1+ bilateral brachioradialis and biceps. 0 bilateral patellae and achilles. Toes are equivocal bilaterally Cerebellar: No ataxia with FNF bilaterally  Gait: Deferred   Lab Results: Basic Metabolic Panel: Recent Labs  Lab 05/18/22 0851  NA 136  K 3.9  CL 105  CO2 22  GLUCOSE 136*  BUN 25*  CREATININE 1.85*  CALCIUM 9.8    CBC: Recent Labs  Lab 05/18/22 0851 05/18/22 0906  WBC 5.8  --   NEUTROABS  --  3.9  HGB 13.4  --   HCT 40.5  --   MCV 89.8  --   PLT 205  --     Cardiac Enzymes: No results for input(s): "CKTOTAL", "CKMB", "CKMBINDEX", "TROPONINI" in the last 168 hours.  Lipid Panel: No results for input(s): "CHOL", "TRIG", "HDL", "CHOLHDL", "VLDL", "LDLCALC" in the last 168 hours.  Imaging: MR Brain Wo Contrast (neuro protocol)  Result Date: 05/18/2022 CLINICAL DATA:  Neuro deficit, stroke suspected.  EXAM: MRI HEAD WITHOUT CONTRAST TECHNIQUE: Multiplanar, multiecho pulse sequences of the brain and surrounding structures were obtained without intravenous contrast. COMPARISON:  Same-day CT head, brain MRI 06/15/2021 FINDINGS: Brain: There is a small focus of diffusion restriction in the right inferomedial frontal lobe cortex consistent with a punctate acute infarct. There is an additional small focus of diffusion restriction in the right temporoparietal cortex consistent with additional punctate acute infarct (2-30). There is no associated hemorrhage or mass effect. There is more ill-defined DWI signal abnormality in the left lentiform nucleus with associated FLAIR signal abnormality which may reflect an additional subacute infarct, also without hemorrhage or mass effect. There is no other evidence of infarct. There is no acute intracranial hemorrhage or extra-axial fluid collection. Parenchymal volume is normal. The ventricles are normal in size. Mild FLAIR signal abnormality in the supratentorial white matter likely reflects sequela of mild chronic small-vessel ischemic change. There are punctate chronic microhemorrhages in the left cerebral peduncle and right cerebellar hemisphere, nonspecific but new since 2023. The pituitary and suprasellar region are normal. There is no solid mass lesion. There is no mass effect or midline shift. Vascular: Normal flow voids. Skull and upper cervical spine: Normal marrow signal. Sinuses/Orbits: There is mild mucosal thickening in the paranasal sinuses. The globes and orbits are unremarkable. Other: None. IMPRESSION: Punctate acute infarcts in the right inferomedial frontal lobe and temporoparietal cortex, and likely subacute infarct in the left lentiform nucleus. No hemorrhage or mass effect. Electronically Signed   By: Valetta Mole M.D.   On: 05/18/2022 18:13   CT HEAD WO CONTRAST  Result Date: 05/18/2022 CLINICAL DATA:  57 year old male with dizziness for 4 days. EXAM:  CT HEAD WITHOUT CONTRAST TECHNIQUE: Contiguous axial images were obtained from the base of the skull through the vertex  without intravenous contrast. RADIATION DOSE REDUCTION: This exam was performed according to the departmental dose-optimization program which includes automated exposure control, adjustment of the mA and/or kV according to patient size and/or use of iterative reconstruction technique. COMPARISON:  Brain MRI 06/15/2021. FINDINGS: Brain: Stable cerebral volume from the MRI last year. No midline shift, ventriculomegaly, mass effect, evidence of mass lesion, intracranial hemorrhage or evidence of cortically based acute infarction. Gray-white matter differentiation is within normal limits throughout the brain. Vascular: Mild calcified atherosclerosis at the skull base. No suspicious intracranial vascular hyperdensity. Skull: Negative. Sinuses/Orbits: Tympanic cavities, visualized paranasal sinuses, and mastoids are stable and well aerated. Other: No acute orbit or scalp soft tissue finding. IMPRESSION: Normal for age non contrast CT appearance of the brain, appears stable to MRI last year. Electronically Signed   By: Genevie Ann M.D.   On: 05/18/2022 09:41    Assessment: 57 y.o. male with a PMHx of HTN and obesity who presented to the [facility] ED on Wednesday morning with a c/c of dizziness for the past 4 days that then worsened to the extent that he felt it prudent to be evaluated in the ED. He states that his dizziness felt like a combination of gait unsteadiness in conjunction with movement-induced vertigo. He did not have any associated limb weakness but did feel that he was walking as though drunk. No headache, SOB or CP. There was no tendency to drift towards one side while ambulating. NIHSS on arrival to the ED was 0. He is on labetalol 400 mg twice a day in addition to Benicar. Blood pressures were severely elevated in the ED and he was started on a Cardene drip. CT in the ED at [facility] was unremarkable.  He was then transferred to [facility] for MRI, which revealed punctate acute infarcts in the right inferomedial frontal lobe and temporoparietal cortex, in addition to a likely subacute infarct in the left lentiform nucleus.  - Exam is nonfocal - Labs: - BUN 25, Cr 1.85, eGFR 42 - CBC normal - PT and PTT normal - UDS negative, EtOH < 10 - EKG: Sinus rhythm; Probable left atrial enlargement, Right bundle branch block - CT head: Normal for age non contrast CT appearance of the brain. - MRI brain: Punctate acute infarcts in the right inferomedial frontal lobe and temporoparietal cortex, in addition to a likely subacute infarct in the left lentiform nucleus.  - Stroke risk factors: HTN and obesity   Recommendations: - SBP parameters with goal of no greater than [id number] systolic as the strokes are unlikely to have an appreciable penumbra. 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  - ASA 81 mg po qd, first dose now.  - DVT prophylaxis with SCDs - CTA of head and neck - HgbA1c, fasting lipid panel - PT consult, OT consult, Speech consult - TTE - Continue rosuvastatin - Risk factor modification to include light daily exercise and initiation of 3x per day home BP readings logged in a BP diary - Telemetry monitoring - Frequent neuro checks - NPO until passes stroke swallow screen   Electronically signed: Dr. Kerney Elbe 05/18/2022, 7:20 PM

## 2022-05-18 NOTE — ED Notes (Signed)
Handoff report given to Production assistant, radio charge at [facility] ER.

## 2022-05-19 ENCOUNTER — Inpatient Hospital Stay (HOSPITAL_COMMUNITY): Payer: BC Managed Care – PPO

## 2022-05-19 DIAGNOSIS — N1832 Chronic kidney disease, stage 3b: Secondary | ICD-10-CM

## 2022-05-19 DIAGNOSIS — I63512 Cerebral infarction due to unspecified occlusion or stenosis of left middle cerebral artery: Secondary | ICD-10-CM | POA: Diagnosis not present

## 2022-05-19 DIAGNOSIS — I6330 Cerebral infarction due to thrombosis of unspecified cerebral artery: Secondary | ICD-10-CM | POA: Diagnosis not present

## 2022-05-19 DIAGNOSIS — I6389 Other cerebral infarction: Secondary | ICD-10-CM

## 2022-05-19 DIAGNOSIS — I639 Cerebral infarction, unspecified: Secondary | ICD-10-CM

## 2022-05-19 DIAGNOSIS — I161 Hypertensive emergency: Secondary | ICD-10-CM | POA: Diagnosis present

## 2022-05-19 DIAGNOSIS — E876 Hypokalemia: Secondary | ICD-10-CM

## 2022-05-19 DIAGNOSIS — I160 Hypertensive urgency: Secondary | ICD-10-CM | POA: Diagnosis not present

## 2022-05-19 LAB — BASIC METABOLIC PANEL
Anion gap: 7 (ref 5–15)
Anion gap: 9 (ref 5–15)
BUN: 22 mg/dL — ABNORMAL HIGH (ref 6–20)
BUN: 30 mg/dL — ABNORMAL HIGH (ref 6–20)
CO2: 24 mmol/L (ref 22–32)
CO2: 24 mmol/L (ref 22–32)
Calcium: 9 mg/dL (ref 8.9–10.3)
Calcium: 9.2 mg/dL (ref 8.9–10.3)
Chloride: 103 mmol/L (ref 98–111)
Chloride: 103 mmol/L (ref 98–111)
Creatinine, Ser: 1.87 mg/dL — ABNORMAL HIGH (ref 0.61–1.24)
Creatinine, Ser: 2.26 mg/dL — ABNORMAL HIGH (ref 0.61–1.24)
GFR, Estimated: 42 mL/min — ABNORMAL LOW (ref >60)
GFR, Estimated: 33 mL/min — ABNORMAL LOW (ref >60)
Glucose, Bld: 94 mg/dL (ref 70–99)
Glucose, Bld: 132 mg/dL — ABNORMAL HIGH (ref 70–99)
Potassium: 3.4 mmol/L — ABNORMAL LOW (ref 3.5–5.1)
Potassium: 3.6 mmol/L (ref 3.5–5.1)
Sodium: 134 mmol/L — ABNORMAL LOW (ref 135–145)
Sodium: 136 mmol/L (ref 135–145)

## 2022-05-19 LAB — MRSA NEXT GEN BY PCR, NASAL: MRSA by PCR Next Gen: NOT DETECTED

## 2022-05-19 LAB — LIPID PANEL
Cholesterol: 297 mg/dL — ABNORMAL HIGH (ref 0–200)
HDL: 52 mg/dL (ref >40)
LDL Cholesterol: 216 mg/dL — ABNORMAL HIGH (ref 0–99)
Total CHOL/HDL Ratio: 5.7 RATIO
Triglycerides: 145 mg/dL (ref <150)
VLDL: 29 mg/dL (ref 0–40)

## 2022-05-19 LAB — MAGNESIUM: Magnesium: 2.1 mg/dL (ref 1.7–2.4)

## 2022-05-19 LAB — CBC
HCT: 38 % — ABNORMAL LOW (ref 39.0–52.0)
Hemoglobin: 13.1 g/dL (ref 13.0–17.0)
MCH: 30.6 pg (ref 26.0–34.0)
MCHC: 34.5 g/dL (ref 30.0–36.0)
MCV: 88.8 fL (ref 80.0–100.0)
Platelets: 213 10*3/uL (ref 150–400)
RBC: 4.28 MIL/uL (ref 4.22–5.81)
RDW: 14 % (ref 11.5–15.5)
WBC: 6.3 10*3/uL (ref 4.0–10.5)
nRBC: 0 % (ref 0.0–0.2)

## 2022-05-19 LAB — HIV ANTIBODY (ROUTINE TESTING W REFLEX): HIV Screen 4th Generation wRfx: NONREACTIVE

## 2022-05-19 LAB — HEMOGLOBIN A1C
Hgb A1c MFr Bld: 6.2 % — ABNORMAL HIGH (ref 4.8–5.6)
Mean Plasma Glucose: 131 mg/dL

## 2022-05-19 LAB — GLUCOSE, CAPILLARY
Glucose-Capillary: 99 mg/dL (ref 70–99)
Glucose-Capillary: 117 mg/dL — ABNORMAL HIGH (ref 70–99)
Glucose-Capillary: 149 mg/dL — ABNORMAL HIGH (ref 70–99)
Glucose-Capillary: 115 mg/dL — ABNORMAL HIGH (ref 70–99)

## 2022-05-19 MED ORDER — LOSARTAN POTASSIUM 50 MG PO TABS
100.0000 mg | ORAL_TABLET | Freq: Every day | ORAL | Status: DC
  Administered 2022-05-19 – 2022-05-22 (×4): 100 mg via ORAL
  Filled 2022-05-19 (×5): qty 2

## 2022-05-19 MED ORDER — IOHEXOL 350 MG/ML SOLN
75.0000 mL | Freq: Once | INTRAVENOUS | Status: AC | PRN
  Administered 2022-05-19: 75 mL via INTRAVENOUS

## 2022-05-19 MED ORDER — HYDRALAZINE HCL 20 MG/ML IJ SOLN
10.0000 mg | INTRAMUSCULAR | Status: DC | PRN
  Administered 2022-05-19: 10 mg via INTRAVENOUS
  Filled 2022-05-19: qty 1

## 2022-05-19 MED ORDER — CLOPIDOGREL BISULFATE 75 MG PO TABS
75.0000 mg | ORAL_TABLET | Freq: Every day | ORAL | Status: DC
  Administered 2022-05-19 – 2022-05-22 (×4): 75 mg via ORAL
  Filled 2022-05-19 (×4): qty 1

## 2022-05-19 MED ORDER — LABETALOL HCL 100 MG PO TABS
200.0000 mg | ORAL_TABLET | Freq: Two times a day (BID) | ORAL | Status: DC
  Administered 2022-05-19 (×2): 200 mg via ORAL
  Filled 2022-05-19 (×2): qty 2

## 2022-05-19 MED ORDER — LABETALOL HCL 5 MG/ML IV SOLN
10.0000 mg | INTRAVENOUS | Status: DC | PRN
  Administered 2022-05-19 – 2022-05-20 (×4): 10 mg via INTRAVENOUS
  Filled 2022-05-19 (×6): qty 4

## 2022-05-19 MED ORDER — LABETALOL HCL 5 MG/ML IV SOLN
5.0000 mg | Freq: Once | INTRAVENOUS | Status: AC
  Administered 2022-05-19: 5 mg via INTRAVENOUS

## 2022-05-19 MED ORDER — PERFLUTREN LIPID MICROSPHERE
1.0000 mL | INTRAVENOUS | Status: AC | PRN
  Administered 2022-05-19: 2 mL via INTRAVENOUS

## 2022-05-19 MED ORDER — ASPIRIN 81 MG PO TBEC
81.0000 mg | DELAYED_RELEASE_TABLET | Freq: Every day | ORAL | Status: DC
  Administered 2022-05-19 – 2022-05-22 (×4): 81 mg via ORAL
  Filled 2022-05-19 (×4): qty 1

## 2022-05-19 MED ORDER — CHLORHEXIDINE GLUCONATE CLOTH 2 % EX PADS
6.0000 | MEDICATED_PAD | Freq: Once | CUTANEOUS | Status: AC
  Administered 2022-05-19: 6 via TOPICAL

## 2022-05-19 MED ORDER — POTASSIUM CHLORIDE CRYS ER 20 MEQ PO TBCR
40.0000 meq | EXTENDED_RELEASE_TABLET | Freq: Once | ORAL | Status: AC
  Administered 2022-05-19: 40 meq via ORAL
  Filled 2022-05-19: qty 2

## 2022-05-19 MED ORDER — ORAL CARE MOUTH RINSE: 15.0000 mL | OROMUCOSAL | Status: DC | PRN

## 2022-05-19 MED ORDER — ROSUVASTATIN CALCIUM 20 MG PO TABS
40.0000 mg | ORAL_TABLET | Freq: Every day | ORAL | Status: DC
  Administered 2022-05-19 – 2022-05-21 (×3): 40 mg via ORAL
  Filled 2022-05-19 (×3): qty 2

## 2022-05-19 MED ORDER — CHLORHEXIDINE GLUCONATE CLOTH 2 % EX PADS
6.0000 | MEDICATED_PAD | Freq: Every day | CUTANEOUS | Status: DC
  Administered 2022-05-19 – 2022-05-21 (×3): 6 via TOPICAL

## 2022-05-19 MED ORDER — FUROSEMIDE 10 MG/ML IJ SOLN
40.0000 mg | Freq: Once | INTRAMUSCULAR | Status: AC
  Administered 2022-05-19: 40 mg via INTRAVENOUS
  Filled 2022-05-19: qty 4

## 2022-05-19 MED ORDER — HYDRALAZINE HCL 20 MG/ML IJ SOLN
10.0000 mg | INTRAMUSCULAR | Status: DC | PRN
  Administered 2022-05-19: 20 mg via INTRAVENOUS
  Filled 2022-05-19: qty 1

## 2022-05-19 NOTE — Progress Notes (Signed)
[place] Progress Note Patient Name: [name] [name] DOB: 1965-04-30 MRN: [id number]   Date of Service  05/19/2022  HPI/Events of Note  Hypokalemia - K+ = 3.6 and Creatinine = 2.26. Patient given Lasix earlier today.   eICU Interventions  Will replace K+.        Intervention Category Major Interventions: Electrolyte abnormality - evaluation and management  Lysle Dingwall 05/19/2022, 10:33 PM

## 2022-05-19 NOTE — Progress Notes (Signed)
  Echocardiogram 2D Echocardiogram has been performed.  [name] [name] 05/19/2022, 4:11 PM

## 2022-05-19 NOTE — Progress Notes (Signed)
SLP Cancellation Note  Patient Details Name: [name] [name] MRN: [id number] DOB: 03-24-1965   Cancelled treatment:       Reason Eval/Treat Not Completed: Patient at procedure or test/unavailable. Appears to have no SLP needs at this time. Will sign off, please reorder if needs arise   Woodroe Vogan, Katherene Ponto 05/19/2022, 10:57 AM

## 2022-05-19 NOTE — Progress Notes (Signed)
[facility] ADULT ICU REPLACEMENT PROTOCOL   The patient does apply for the [facility] Adult ICU Electrolyte Replacment Protocol based on the criteria listed below:   1.Exclusion criteria: TCTS, ECMO, Dialysis, and Myasthenia Gravis patients 2. Is GFR >/= 30 ml/min? Yes.    Patient's GFR today is 42 3. Is SCr </= 2? Yes.   Patient's SCr is 1.87 mg/dL 4. Did SCr increase >/= 0.5 in 24 hours? No. 5.Pt's weight >40kg  Yes.   6. Abnormal electrolyte(s):   K 3.4  7. Electrolytes replaced per protocol 8.  Call MD STAT for K+ </= 2.5, Phos </= 1, or Mag </= 1 Physician:  S. Rosie Fate R Devron Cohick 05/19/2022 6:13 AM

## 2022-05-19 NOTE — Progress Notes (Signed)
  Transition of Care [facility]) Screening Note   Patient Details  Name: [name] [name] Date of Birth: 24-Dec-1965   Transition of Care [facility]) CM/SW Contact:    Ella Bodo, RN Phone Number: 05/19/2022, 5:22 PM    Transition of Care Department [facility]) has reviewed patient and no TOC needs have been identified at this time. We will continue to monitor patient advancement through interdisciplinary progression rounds. If new patient transition needs arise, please place a TOC consult.  Reinaldo Raddle, RN, BSN  Trauma/Neuro ICU Case Manager [phone]

## 2022-05-19 NOTE — Hospital Course (Addendum)
[name] [name] is a 57 y.o. male who [facility]. History is significant for obesity, OSA, HTN, CKD3b, and gout. Hospital course is outlined below by problem.   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 BP noted to be severe range at 220/113. LDL 216. Hgb A1c ***. CT head negative. MRI demonstrated right frontal and temporal parietal cortex as well as subacute left lentiform nucleus infarcts. CTA with mod-severe stenosis bilateral V4 and left P2. No tPA given as he was outside window. He was started on cardene drip for BP and admitted to ICU. Neurology added ASA/plavix for 3 weeks followed by ASA alone as well as Crestor.  PT/OT without follow up. Echo demonstrated ***. He was weaned off cardene drip on 4/4 and admitted to FMTS 4/5 for SBP <180.  Other conditions that were chronic and stable: OSA (continued CPAP), CKD3b (improved with fluids), gout (continued allopurinol).  Issues for follow up: Ensure continued statin use and ASA alone after 3 week period (***) Assess functional status   3. Enlarged thyroid with hypoenhancing nodules, the largest of which measures up to 2.7 cm, thyroid ultrasound was recommended

## 2022-05-19 NOTE — Progress Notes (Signed)
NAME:  [name] [name], MRN:  [id number], DOB:  08-11-1965, LOS: 1 ADMISSION DATE:  05/18/2022,  ADMISSION DATE:  05/18/22 REFERRING MD:  Oswald Hillock, CHIEF COMPLAINT:  dizziness   History of Present Illness:  This is a very pleasant 57 year old man with a history of sleep apnea, hypertension, gout presenting with 3 days of persistent dizziness and vertigo.  No focal weakness, no trouble swallowing or speaking, no numbness, no tingling.  Does have a history of self-limited dizziness which he initially thought this was but due to persistence prompted him to visit the emergency room.  Head CT negative but MRI shows infarcts in the right frontal and temporal parietal cortex as well as a subacute on the left lentiform nucleus.  Patient's blood pressure noted to be severely elevated.  He was started on a Cardene drip and after calls to FMTS and neurology, ultimately PCCM to admit.  Patient admits to stress recently after his brother died about a year ago from cancer.  He is understandably distraught over his diagnosis of a stroke.  He has been taking all of his medicines as prescribed including multiple antihypertensives.  Pertinent  Medical History  Hypertension Obesity  CKD OSA  Significant Hospital Events: Including procedures, antibiotic start and stop dates in addition to other pertinent events   4/3 admitted on cleviprex for stroke   Interim History / Subjective:  On 6 cleviprex, eating breakfast  Objective   Blood pressure (!) 180/92, pulse 60, temperature 98.3 F (36.8 C), temperature source Axillary, resp. rate 16, height 6' (1.829 m), weight (!) 138.2 kg, SpO2 95 %.    FiO2 (%):  [21 %] 21 %   Intake/Output Summary (Last 24 hours) at 05/19/2022 1041 Last data filed at 05/19/2022 0800 Gross per 24 hour  Intake 139.43 ml  Output 550 ml  Net -410.57 ml   Filed Weights   05/18/22 0848 05/19/22 0147  Weight: (!) 166.5 kg (!) 138.2 kg    Examination: General: obese M NAD  HENT: NCATpink  mm  Lungs: CATb on RA  Cardiovascular: rr cap refill brisk  Abdomen: soft round ndnt  Extremities: no acute joint deformity  Neuro: AAOx4  Skin: cdw   Resolved Hospital Problem list   Not applicable  Assessment & Plan:   Acute ischemic infarcts  -seen on MRI -restart home labetalol, losartan for SBP goal < 180. Wean clevi as able   -further neuro imaging and secondary workup per stroke service  -ASA -cont statin -PT/OT   OSA -qHS CPAP   HTN -BP goals as above   CKD Hypokalemia -replace K, encourage PO intake, trend BMP PRN   Best Practice (right click and "Reselect all SmartList Selections" daily)   Diet/type: Regular consistency (see orders) DVT prophylaxis: per neuro GI prophylaxis: N/A Lines: N/A Foley:  N/A Code Status:  full code Last date of multidisciplinary goals of care discussion [n/a]  Labs   CBC: Recent Labs  Lab 05/18/22 0851 05/18/22 0906 05/19/22 0441  WBC 5.8  --  6.3  NEUTROABS  --  3.9  --   HGB 13.4  --  13.1  HCT 40.5  --  38.0*  MCV 89.8  --  88.8  PLT 205  --  123456    Basic Metabolic Panel: Recent Labs  Lab 05/18/22 0851 05/19/22 0441  NA 136 134*  K 3.9 3.4*  CL 105 103  CO2 22 24  GLUCOSE 136* 94  BUN 25* 22*  CREATININE 1.85* 1.87*  CALCIUM 9.8  9.0   GFR: Estimated Creatinine Clearance: 63.5 mL/min (A) (by C-G formula based on SCr of 1.87 mg/dL (H)). Recent Labs  Lab 05/18/22 0851 05/19/22 0441  WBC 5.8 6.3    Liver Function Tests: Recent Labs  Lab 05/18/22 0851  AST 26  ALT 18  ALKPHOS 59  BILITOT 0.9  PROT 7.3  ALBUMIN 3.9   No results for input(s): "LIPASE", "AMYLASE" in the last 168 hours. No results for input(s): "AMMONIA" in the last 168 hours.  ABG No results found for: "PHART", "PCO2ART", "PO2ART", "HCO3", "TCO2", "ACIDBASEDEF", "O2SAT"   Coagulation Profile: Recent Labs  Lab 05/18/22 0906  INR 0.9    Cardiac Enzymes: No results for input(s): "CKTOTAL", "CKMB", "CKMBINDEX",  "TROPONINI" in the last 168 hours.  HbA1C: No results found for: "HGBA1C"  CBG: Recent Labs  Lab 05/19/22 0809  GLUCAP 99    CRITICAL CARE Performed by: Cristal Generous   Total critical care time: 37 minutes  Critical care time was exclusive of separately billable procedures and treating other patients. Critical care was necessary to treat or prevent imminent or life-threatening deterioration.  Critical care was time spent personally by me on the following activities: development of treatment plan with patient and/or surrogate as well as nursing, discussions with consultants, evaluation of patient's response to treatment, examination of patient, obtaining history from patient or surrogate, ordering and performing treatments and interventions, ordering and review of laboratory studies, ordering and review of radiographic studies, pulse oximetry and re-evaluation of patient's condition.  Eliseo Gum MSN, AGACNP-BC [place] for pager  05/19/2022, 10:41 AM

## 2022-05-19 NOTE — Progress Notes (Addendum)
[place] Progress Note Patient Name: [name] [name] DOB: Jun 07, 1965 MRN: [id number]   Date of Service  05/19/2022  HPI/Events of Note  Nursing reports elevated QTc = 0.514 seconds. Previous QTc 0.463 seconds. Review of medication list reveals on Ondansetron PO PRN as with only medication that can prolong  QTc interval (moderate risk - IV > Oral)  eICU Interventions  Plan: BMP and Mg++ level STAT.  Do not give Ondansetron PO PRN until QTc interval back below 0.5 seconds.      Intervention Category Major Interventions: Other:  [name] [name] 05/19/2022, 9:00 PM

## 2022-05-19 NOTE — Progress Notes (Signed)
E-link RN notified of telemetry measurements of QRS of 0.17 and Qtc of 0.53.

## 2022-05-19 NOTE — Evaluation (Signed)
Occupational Therapy Evaluation Patient Details Name: [name] [name] MRN: [id number] DOB: [birth year] Today's Date: 05/19/2022   History of Present Illness 57 year old man with a history of sleep apnea, hypertension, gout presenting with 3 days of persistent dizziness and vertigo.  No focal weakness, no trouble swallowing or speaking, no numbness, no tingling. Head CT negative but MRI shows infarcts in the right frontal and temporal parietal cortex as well as a subacute on the left lentiform nucleus.   Clinical Impression   Patient evaluated by Occupational Therapy with no further acute OT needs identified. All education has been completed and the patient has no further questions. Prior to admit, pt was independent with all ADL tasks and functional mobility including working full time. He is currently functioning at baseline and no follow up Occupational Therapy or equipment needs are warranted. OT is signing off. Thank you for this referral.       Recommendations for follow up therapy are one component of a multi-disciplinary discharge planning process, led by the attending physician.  Recommendations may be updated based on patient status, additional functional criteria and insurance authorization.   Assistance Recommended at Discharge None     Functional Status Assessment  Patient has not had a recent decline in their functional status  Equipment Recommendations  None recommended by OT       Precautions / Restrictions Precautions Precautions: Fall Precaution Comments: dizziness Restrictions Weight Bearing Restrictions: No      Mobility Bed Mobility Overal bed mobility: Independent      Transfers Overall transfer level: Independent Equipment used: None         Balance Overall balance assessment: No apparent balance deficits (not formally assessed)         ADL either performed or assessed with clinical judgement   ADL Overall ADL's : Independent         Vision  Baseline Vision/History: 1 Wears glasses Ability to See in Adequate Light: 0 Adequate Patient Visual Report: No change from baseline Vision Assessment?: No apparent visual deficits     Perception Perception Perception Tested?: No Spatial deficits: functional   Praxis Praxis Praxis tested?: Within functional limits    Pertinent Vitals/Pain Pain Assessment Pain Assessment: No/denies pain     Hand Dominance Right   Extremity/Trunk Assessment Upper Extremity Assessment Upper Extremity Assessment: Overall WFL for tasks assessed   Lower Extremity Assessment Lower Extremity Assessment: Defer to PT evaluation   Cervical / Trunk Assessment Cervical / Trunk Assessment: Other exceptions (body habitus)   Communication Communication Communication: No difficulties   Cognition Arousal/Alertness: Awake/alert Behavior During Therapy: WFL for tasks assessed/performed Overall Cognitive Status: Within Functional Limits for tasks assessed         General Comments   BP: supine 198/90 (114), seated EOB: 198/94            Home Living Family/patient expects to be discharged to:: Private residence Living Arrangements: Children (adult daughter)   Type of Home: House Home Access: Level entry     Home Layout: Two level Alternate Level Stairs-Number of Steps: flight Alternate Level Stairs-Rails: Right Bathroom Shower/Tub: Teacher, early years/pre: Standard     Home Equipment: None          Prior Functioning/Environment Prior Level of Function : Independent/Modified Independent;Working/employed;Driving      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        OT Problem List: Impaired balance (sitting and/or standing)      OT Treatment/Interventions:  Eval only   OT Goals(Current goals can be found in the care plan section) Acute Rehab OT Goals Patient Stated Goal: to use bathroom  OT Frequency:  1X visit       AM-PAC OT "6 Clicks" Daily  Activity     Outcome Measure Help from another person eating meals?: None Help from another person taking care of personal grooming?: None Help from another person toileting, which includes using toliet, bedpan, or urinal?: None Help from another person bathing (including washing, rinsing, drying)?: None Help from another person to put on and taking off regular upper body clothing?: None   6 Click Score: 20   End of Session    Activity Tolerance: Patient tolerated treatment well Patient left: with family/visitor present;Other (comment) (handoff with PT and PT students)  OT Visit Diagnosis: Dizziness and giddiness (R42)                Time: [id number] OT Time Calculation (min): 21 min Charges:  OT General Charges $OT Visit: 1 Visit OT Evaluation $OT Eval Low Complexity: 1 Low  Ailene Ravel, OTR/L,CBIS  Supplemental OT - [facility] and [facility] Secure Chat Preferred    Anayah Arvanitis, Clarene Duke 05/19/2022, 10:50 AM

## 2022-05-19 NOTE — Progress Notes (Signed)
STROKE TEAM PROGRESS NOTE   SUBJECTIVE (INTERVAL HISTORY) His daughter is at the bedside.  Overall his condition is stable. Pt neuro intact still on cleviprex for hypertensive urgency management. Will put on po meds. BP goal < 180/105.    OBJECTIVE Temp:  [97.8 F (36.6 C)-98.4 F (36.9 C)] 98.3 F (36.8 C) (04/04 0800) Pulse Rate:  [54-76] 60 (04/04 0830) Cardiac Rhythm: Sinus bradycardia;Normal sinus rhythm (04/04 0800) Resp:  [0-25] 16 (04/04 0830) BP: (151-235)/(73-196) 180/92 (04/04 0830) SpO2:  [92 %-100 %] 95 % (04/04 0830) FiO2 (%):  [21 %] 21 % (04/04 0225) Weight:  [138.2 kg] 138.2 kg (04/04 0147)  Recent Labs  Lab 05/19/22 0809 05/19/22 1112  GLUCAP 99 117*   Recent Labs  Lab 05/18/22 0851 05/19/22 0441  NA 136 134*  K 3.9 3.4*  CL 105 103  CO2 22 24  GLUCOSE 136* 94  BUN 25* 22*  CREATININE 1.85* 1.87*  CALCIUM 9.8 9.0   Recent Labs  Lab 05/18/22 0851  AST 26  ALT 18  ALKPHOS 59  BILITOT 0.9  PROT 7.3  ALBUMIN 3.9   Recent Labs  Lab 05/18/22 0851 05/18/22 0906 05/19/22 0441  WBC 5.8  --  6.3  NEUTROABS  --  3.9  --   HGB 13.4  --  13.1  HCT 40.5  --  38.0*  MCV 89.8  --  88.8  PLT 205  --  213   No results for input(s): "CKTOTAL", "CKMB", "CKMBINDEX", "TROPONINI" in the last 168 hours. Recent Labs    05/18/22 0906  LABPROT 12.2  INR 0.9   Recent Labs    05/18/22 0906  COLORURINE COLORLESS*  LABSPEC <1.005*  PHURINE 6.0  GLUCOSEU NEGATIVE  HGBUR SMALL*  BILIRUBINUR NEGATIVE  KETONESUR NEGATIVE  PROTEINUR 30*  NITRITE NEGATIVE  LEUKOCYTESUR NEGATIVE       Component Value Date/Time   CHOL 297 (H) 05/19/2022 0441   TRIG 145 05/19/2022 0441   HDL 52 05/19/2022 0441   CHOLHDL 5.7 05/19/2022 0441   VLDL 29 05/19/2022 0441   LDLCALC 216 (H) 05/19/2022 0441   No results found for: "HGBA1C"    Component Value Date/Time   LABOPIA NONE DETECTED 05/18/2022 0906   COCAINSCRNUR NONE DETECTED 05/18/2022 0906   LABBENZ NONE  DETECTED 05/18/2022 0906   AMPHETMU NONE DETECTED 05/18/2022 0906   THCU NONE DETECTED 05/18/2022 0906   LABBARB NONE DETECTED 05/18/2022 0906    Recent Labs  Lab 05/18/22 0906  ETH <10    I have personally reviewed the radiological images below and agree with the radiology interpretations.  CT ANGIO HEAD NECK W WO CM  Result Date: 05/19/2022 CLINICAL DATA:  Acute infarcts on 05/18/2022 MRI EXAM: CT ANGIOGRAPHY HEAD AND NECK TECHNIQUE: Multidetector CT imaging of the head and neck was performed using the standard protocol during bolus administration of intravenous contrast. Multiplanar CT image reconstructions and MIPs were obtained to evaluate the vascular anatomy. Carotid stenosis measurements (when applicable) are obtained utilizing NASCET criteria, using the distal internal carotid diameter as the denominator. RADIATION DOSE REDUCTION: This exam was performed according to the departmental dose-optimization program which includes automated exposure control, adjustment of the mA and/or kV according to patient size and/or use of iterative reconstruction technique. CONTRAST:  46mL OMNIPAQUE IOHEXOL 350 MG/ML SOLN COMPARISON:  No prior CTA, correlation is made with 05/18/2022 CT head and MRI head FINDINGS: CT HEAD FINDINGS Brain: No evidence of acute infarct, hemorrhage, mass, mass effect, or midline shift.  No hydrocephalus or extra-axial fluid collection. No hypodensity is seen to correlate with the acute infarcts on the 05/18/2022 MRI. Vascular: No hyperdense vessel. Skull: Negative for fracture or focal lesion. Sinuses/Orbits: Minimal mucosal thickening in the ethmoid air cells. No acute finding in the orbits. Other: The mastoid air cells are well aerated. CTA NECK FINDINGS Aortic arch: Two-vessel arch with a common origin of the brachiocephalic and left common carotid arteries. Imaged portion shows no evidence of aneurysm or dissection. No significant stenosis of the major arch vessel origins. Right  carotid system: No evidence of dissection, occlusion, or hemodynamically significant stenosis (greater than 50%). Left carotid system: No evidence of dissection, occlusion, or hemodynamically significant stenosis (greater than 50%). Vertebral arteries: Left dominant system. No evidence of dissection, occlusion, or hemodynamically significant stenosis (greater than 50%). Skeleton: No acute osseous abnormality. Other neck: Enlarged thyroid, with hypoenhancing nodules, the largest of which measures up to 2.7 cm. Upper chest: No focal pulmonary opacity or pleural effusion. Review of the MIP images confirms the above findings CTA HEAD FINDINGS Anterior circulation: Both internal carotid arteries are patent to the termini, without significant stenosis. A1 segments patent. Normal anterior communicating artery. Anterior cerebral arteries are patent to their distal aspects without significant stenosis. Mild stenosis in the distal left M1 (series 12, image 100-103). No significant stenosis in the right M1. MCA branches perfused to their distal aspects without significant stenosis. Posterior circulation: The right vertebral artery primarily supplies the right PICA. Moderate stenosis in the distal right V4 (series 12, image 131 and 138). The left vertebral artery is patent to the vertebrobasilar junction, with moderate to severe stenosis proximally (series 12, image 148). Basilar patent to its distal aspect with mild-to-moderate stenosis (series 12, image 132 and 122, although the basilar does not provide significant contribution to the PCAs. Superior cerebellar arteries patent proximally. Patent, hypoplastic P1 segments. Near fetal origin of the bilateral PCAs from the posterior communicating arteries. Mild stenosis in the proximal right P2 (series 12, image 102) and distal right P2 (series 12, image 24). Moderate stenosis in the distal left P2 (series 12, images 10 1-102 Venous sinuses: As permitted by contrast timing,  patent. Anatomic variants: Near fetal origin of the bilateral PCAs. Review of the MIP images confirms the above findings IMPRESSION: 1. No acute intracranial process. The acute infarcts seen on the 05/18/2022 MRI are not apparent on CT. 2. No intracranial large vessel occlusion. Moderate to severe stenosis in the proximal left V4. Moderate stenosis in the right V4 and left P2. Mild-to-moderate stenosis in the basilar artery. Mild stenosis in the left M1 and right P2. 3. No hemodynamically significant stenosis in the neck. 4. Enlarged thyroid with hypoenhancing nodules, the largest of which measures up to 2.7 cm. If this has not previously been evaluated, a non-emergent ultrasound of the thyroid is recommended. (Reference: J Am Coll Radiol. 2015 Feb;12(2): 143-50) Electronically Signed   By: Merilyn Baba M.D.   On: 05/19/2022 01:43   MR Brain Wo Contrast (neuro protocol)  Result Date: 05/18/2022 CLINICAL DATA:  Neuro deficit, stroke suspected. EXAM: MRI HEAD WITHOUT CONTRAST TECHNIQUE: Multiplanar, multiecho pulse sequences of the brain and surrounding structures were obtained without intravenous contrast. COMPARISON:  Same-day CT head, brain MRI 06/15/2021 FINDINGS: Brain: There is a small focus of diffusion restriction in the right inferomedial frontal lobe cortex consistent with a punctate acute infarct. There is an additional small focus of diffusion restriction in the right temporoparietal cortex consistent with additional punctate acute infarct (  2-30). There is no associated hemorrhage or mass effect. There is more ill-defined DWI signal abnormality in the left lentiform nucleus with associated FLAIR signal abnormality which may reflect an additional subacute infarct, also without hemorrhage or mass effect. There is no other evidence of infarct. There is no acute intracranial hemorrhage or extra-axial fluid collection. Parenchymal volume is normal. The ventricles are normal in size. Mild FLAIR signal  abnormality in the supratentorial white matter likely reflects sequela of mild chronic small-vessel ischemic change. There are punctate chronic microhemorrhages in the left cerebral peduncle and right cerebellar hemisphere, nonspecific but new since 2023. The pituitary and suprasellar region are normal. There is no solid mass lesion. There is no mass effect or midline shift. Vascular: Normal flow voids. Skull and upper cervical spine: Normal marrow signal. Sinuses/Orbits: There is mild mucosal thickening in the paranasal sinuses. The globes and orbits are unremarkable. Other: None. IMPRESSION: Punctate acute infarcts in the right inferomedial frontal lobe and temporoparietal cortex, and likely subacute infarct in the left lentiform nucleus. No hemorrhage or mass effect. Electronically Signed   By: Valetta Mole M.D.   On: 05/18/2022 18:13   CT HEAD WO CONTRAST  Result Date: 05/18/2022 CLINICAL DATA:  57 year old male with dizziness for 4 days. EXAM: CT HEAD WITHOUT CONTRAST TECHNIQUE: Contiguous axial images were obtained from the base of the skull through the vertex without intravenous contrast. RADIATION DOSE REDUCTION: This exam was performed according to the departmental dose-optimization program which includes automated exposure control, adjustment of the mA and/or kV according to patient size and/or use of iterative reconstruction technique. COMPARISON:  Brain MRI 06/15/2021. FINDINGS: Brain: Stable cerebral volume from the MRI last year. No midline shift, ventriculomegaly, mass effect, evidence of mass lesion, intracranial hemorrhage or evidence of cortically based acute infarction. Gray-white matter differentiation is within normal limits throughout the brain. Vascular: Mild calcified atherosclerosis at the skull base. No suspicious intracranial vascular hyperdensity. Skull: Negative. Sinuses/Orbits: Tympanic cavities, visualized paranasal sinuses, and mastoids are stable and well aerated. Other: No acute  orbit or scalp soft tissue finding. IMPRESSION: Normal for age non contrast CT appearance of the brain, appears stable to MRI last year. Electronically Signed   By: Genevie Ann M.D.   On: 05/18/2022 09:41     PHYSICAL EXAM  Temp:  [97.8 F (36.6 C)-98.4 F (36.9 C)] 98.3 F (36.8 C) (04/04 0800) Pulse Rate:  [54-76] 60 (04/04 0830) Resp:  [0-25] 16 (04/04 0830) BP: (151-235)/(73-196) 180/92 (04/04 0830) SpO2:  [92 %-100 %] 95 % (04/04 0830) FiO2 (%):  [21 %] 21 % (04/04 0225) Weight:  [138.2 kg] 138.2 kg (04/04 0147)  General - Well nourished, well developed, in no apparent distress.  Ophthalmologic - fundi not visualized due to noncooperation.  Cardiovascular - Regular rhythm and rate.  Mental Status -  Level of arousal and orientation to time, place, and person were intact. Language including expression, naming, repetition, comprehension was assessed and found intact. Fund of Knowledge was assessed and was intact.  Cranial Nerves II - XII - II - Visual field intact OU. III, IV, VI - Extraocular movements intact. V - Facial sensation intact bilaterally. VII - Facial movement intact bilaterally. VIII - Hearing & vestibular intact bilaterally. X - Palate elevates symmetrically. XI - Chin turning & shoulder shrug intact bilaterally. XII - Tongue protrusion intact.  Motor Strength - The patient's strength was normal in all extremities and pronator drift was absent.  Bulk was normal and fasciculations were absent.  Motor Tone - Muscle tone was assessed at the neck and appendages and was normal.  Reflexes - The patient's reflexes were symmetrical in all extremities and he had no pathological reflexes.  Sensory - Light touch, temperature/pinprick were assessed and were symmetrical.    Coordination - The patient had normal movements in the hands and feet with no ataxia or dysmetria.  Tremor was absent.  Gait and Station - deferred.   ASSESSMENT/PLAN [name] [name] is a 57 y.o.  male with history of HTN, HLD, CKD, obesity admitted for dizziness for 4 days with worsening and imbalance. BP was high in ED, admitted to ICU for IV BP meds. No tPA given due to outside window.    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 2D Echo  pending LDL 216 HgbA1c pending UDS neg SCDs for VTE prophylaxis No antithrombotic prior to admission, now on aspirin 81 mg daily and clopidogrel 75 mg daily for 3 weeks and then ASA alone Patient counseled to be compliant with his antithrombotic medications Ongoing aggressive stroke risk factor management Therapy recommendations:  pending Disposition:  pending  Hyperlipidemia Home meds:  none, off crestor LDL 216, goal < 70 Now on crestor 40 Continue statin at discharge  Other Stroke Risk Factors Obesity, Body mass index is 41.32 kg/m.   Other Active Problems CKD IIIb, Cre 1.85->1.87  Hospital day # 1  This patient is critically ill due to stroke, hypertensive urgency, CKD and at significant risk of neurological worsening, death form recurrent stroke, hemorrhagic conversion, hypertensive encephalopathy, renal failure. This patient's care requires constant monitoring of vital signs, hemodynamics, respiratory and cardiac monitoring, review of multiple databases, neurological assessment, discussion with family, other specialists and  medical decision making of high complexity. I spent 40 minutes of neurocritical care time in the care of this patient. I had long discussion with pt and daughter at bedside, updated pt current condition, treatment plan and potential prognosis, and answered all the questions. They expressed understanding and appreciation. I also discussed with Dr. Tacy Learn CCM   [name] Hawking, MD PhD Stroke Neurology 05/19/2022 11:26 AM    To contact Stroke Continuity provider, please refer to [email or web address] After hours, contact General Neurology

## 2022-05-19 NOTE — Evaluation (Signed)
Physical Therapy Evaluation Patient Details Name: [name] [name] MRN: [id number] DOB: 1965/10/29 Today's Date: 05/19/2022  History of Present Illness  57 year old man with a history of sleep apnea, hypertension, gout presenting with 3 days of persistent dizziness and vertigo.  No focal weakness, no trouble swallowing or speaking, no numbness, no tingling. Head CT negative but MRI shows infarcts in the right frontal and temporal parietal cortex as well as a subacute on the left lentiform nucleus.  Clinical Impression   Pt presents with min dizziness intermittently but reports no symptoms during mobility, and decreased activity tolerance. Pt to benefit from acute PT to address deficits. Pt ambulated good hallway distance, scored 20/24 on DGI so not at increased risk of falls. Pt reports being close to baseline, PT reviewed BE FAST principles with pt and daughter.       Recommendations for follow up therapy are one component of a multi-disciplinary discharge planning process, led by the attending physician.  Recommendations may be updated based on patient status, additional functional criteria and insurance authorization.  Follow Up Recommendations       Assistance Recommended at Discharge None  Patient can return home with the following       Equipment Recommendations None recommended by PT  Recommendations for Other Services       Functional Status Assessment Patient has not had a recent decline in their functional status     Precautions / Restrictions Precautions Precautions: Fall Precaution Comments: dizziness Restrictions Weight Bearing Restrictions: No      Mobility  Bed Mobility               General bed mobility comments: up with OT    Transfers Overall transfer level: Modified independent                      Ambulation/Gait Ambulation/Gait assistance: Modified independent (Device/Increase time) Gait Distance (Feet): 500 Feet Assistive device:  None Gait Pattern/deviations: Step-through pattern, Decreased stride length Gait velocity: decr     General Gait Details: improving gait speed with continued gait, no evidence of unsteadiness when assessed (See balance section)  Stairs            Wheelchair Mobility    Modified Rankin (Stroke Patients Only) Modified Rankin (Stroke Patients Only) Pre-Morbid Rankin Score: No symptoms Modified Rankin: No symptoms     Balance Overall balance assessment: Modified Independent                               Standardized Balance Assessment Standardized Balance Assessment : Dynamic Gait Index   Dynamic Gait Index Level Surface: Normal Change in Gait Speed: Mild Impairment Gait with Horizontal Head Turns: Normal Gait with Vertical Head Turns: Normal Gait and Pivot Turn: Mild Impairment Step Over Obstacle: Mild Impairment Step Around Obstacles: Normal Steps: Mild Impairment Total Score: 20       Pertinent Vitals/Pain Pain Assessment Pain Assessment: No/denies pain    Home Living Family/patient expects to be discharged to:: Private residence Living Arrangements: Children (adult daughter)   Type of Home: House Home Access: Level entry     Alternate Level Stairs-Number of Steps: flight Home Layout: Two level Home Equipment: None      Prior Function Prior Level of Function : Independent/Modified Independent;Working/employed;Driving               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     Hand  Dominance   Dominant Hand: Right    Extremity/Trunk Assessment   Upper Extremity Assessment Upper Extremity Assessment: Defer to OT evaluation    Lower Extremity Assessment Lower Extremity Assessment: Overall WFL for tasks assessed    Cervical / Trunk Assessment Cervical / Trunk Assessment: Normal  Communication   Communication: No difficulties  Cognition Arousal/Alertness: Awake/alert Behavior During Therapy: WFL for tasks  assessed/performed Overall Cognitive Status: Within Functional Limits for tasks assessed                                          General Comments      Exercises Other Exercises Other Exercises: Reviewed BE FAST principle, pt expresses understanding   Assessment/Plan    PT Assessment Patient needs continued PT services;Patient does not need any further PT services  PT Problem List         PT Treatment Interventions      PT Goals (Current goals can be found in the Care Plan section)  Acute Rehab PT Goals Patient Stated Goal: home PT Goal Formulation: With patient Time For Goal Achievement: 05/19/22 Potential to Achieve Goals: Good    Frequency       Co-evaluation               AM-PAC PT "6 Clicks" Mobility  Outcome Measure Help needed turning from your back to your side while in a flat bed without using bedrails?: None Help needed moving from lying on your back to sitting on the side of a flat bed without using bedrails?: None Help needed moving to and from a bed to a chair (including a wheelchair)?: None Help needed standing up from a chair using your arms (e.g., wheelchair or bedside chair)?: None Help needed to walk in hospital room?: None Help needed climbing 3-5 steps with a railing? : None 6 Click Score: 24    End of Session Equipment Utilized During Treatment: Gait belt Activity Tolerance: Patient tolerated treatment well Patient left: in chair;with call bell/phone within reach;with chair alarm set Nurse Communication: Mobility status PT Visit Diagnosis: Other abnormalities of gait and mobility (R26.89);Muscle weakness (generalized) (M62.81)    Time: [id number] PT Time Calculation (min) (ACUTE ONLY): 18 min   Charges:   PT Evaluation $PT Eval Low Complexity: 1 Low         Nekisha Mcdiarmid S, PT DPT Acute Rehabilitation Services Pager [phone]  Office [phone]   [place] E Ruffin Pyo 05/19/2022, 1:43 PM

## 2022-05-20 DIAGNOSIS — R269 Unspecified abnormalities of gait and mobility: Secondary | ICD-10-CM

## 2022-05-20 DIAGNOSIS — R9431 Abnormal electrocardiogram [ECG] [EKG]: Secondary | ICD-10-CM | POA: Insufficient documentation

## 2022-05-20 DIAGNOSIS — I6330 Cerebral infarction due to thrombosis of unspecified cerebral artery: Secondary | ICD-10-CM | POA: Diagnosis not present

## 2022-05-20 DIAGNOSIS — Z9189 Other specified personal risk factors, not elsewhere classified: Secondary | ICD-10-CM

## 2022-05-20 DIAGNOSIS — I639 Cerebral infarction, unspecified: Secondary | ICD-10-CM | POA: Diagnosis not present

## 2022-05-20 DIAGNOSIS — N1832 Chronic kidney disease, stage 3b: Secondary | ICD-10-CM | POA: Diagnosis present

## 2022-05-20 DIAGNOSIS — N179 Acute kidney failure, unspecified: Secondary | ICD-10-CM | POA: Insufficient documentation

## 2022-05-20 LAB — ECHOCARDIOGRAM COMPLETE
AR max vel: 3.29 cm2
AV Area VTI: 4.35 cm2
AV Area mean vel: 3.08 cm2
AV Mean grad: 4 mmHg
AV Peak grad: 8 mmHg
Ao pk vel: 1.41 m/s
Area-P 1/2: 3.1 cm2
Height: 72 in
Weight: 4874.81 oz

## 2022-05-20 LAB — BASIC METABOLIC PANEL
Anion gap: 9 (ref 5–15)
BUN: 28 mg/dL — ABNORMAL HIGH (ref 6–20)
CO2: 22 mmol/L (ref 22–32)
Calcium: 9 mg/dL (ref 8.9–10.3)
Chloride: 105 mmol/L (ref 98–111)
Creatinine, Ser: 2.35 mg/dL — ABNORMAL HIGH (ref 0.61–1.24)
GFR, Estimated: 32 mL/min — ABNORMAL LOW (ref >60)
Glucose, Bld: 96 mg/dL (ref 70–99)
Potassium: 3.8 mmol/L (ref 3.5–5.1)
Sodium: 136 mmol/L (ref 135–145)

## 2022-05-20 LAB — GLUCOSE, CAPILLARY
Glucose-Capillary: 115 mg/dL — ABNORMAL HIGH (ref 70–99)
Glucose-Capillary: 161 mg/dL — ABNORMAL HIGH (ref 70–99)
Glucose-Capillary: 160 mg/dL — ABNORMAL HIGH (ref 70–99)
Glucose-Capillary: 158 mg/dL — ABNORMAL HIGH (ref 70–99)

## 2022-05-20 LAB — MAGNESIUM: Magnesium: 1.9 mg/dL (ref 1.7–2.4)

## 2022-05-20 MED ORDER — LABETALOL HCL 200 MG PO TABS
300.0000 mg | ORAL_TABLET | Freq: Two times a day (BID) | ORAL | Status: DC
  Administered 2022-05-20 – 2022-05-22 (×5): 300 mg via ORAL
  Filled 2022-05-20: qty 2
  Filled 2022-05-20: qty 3
  Filled 2022-05-20 (×3): qty 2

## 2022-05-20 MED ORDER — MAGNESIUM SULFATE IN D5W 1-5 GM/100ML-% IV SOLN
1.0000 g | Freq: Once | INTRAVENOUS | Status: AC
  Administered 2022-05-20: 1 g via INTRAVENOUS
  Filled 2022-05-20: qty 100

## 2022-05-20 MED ORDER — NIFEDIPINE ER OSMOTIC RELEASE 30 MG PO TB24
30.0000 mg | ORAL_TABLET | Freq: Every day | ORAL | Status: DC
  Administered 2022-05-20: 30 mg via ORAL
  Filled 2022-05-20 (×2): qty 1

## 2022-05-20 MED ORDER — NIFEDIPINE ER OSMOTIC RELEASE 30 MG PO TB24: 30.0000 mg | ORAL_TABLET | Freq: Every day | ORAL | Status: DC

## 2022-05-20 MED ORDER — NIFEDIPINE ER OSMOTIC RELEASE 60 MG PO TB24: 60.0000 mg | ORAL_TABLET | Freq: Every day | ORAL | Status: DC

## 2022-05-20 MED ORDER — POTASSIUM CHLORIDE CRYS ER 20 MEQ PO TBCR
20.0000 meq | EXTENDED_RELEASE_TABLET | Freq: Once | ORAL | Status: AC
  Administered 2022-05-20: 20 meq via ORAL
  Filled 2022-05-20: qty 1

## 2022-05-20 NOTE — Assessment & Plan Note (Signed)
QTC overnight 514 ms per CCM note. K and Mg LLN and replaced. Patient is asymptomatic. -Dc Zofran for now -AM BMP, Mg -Repeat EKG

## 2022-05-20 NOTE — Assessment & Plan Note (Addendum)
Stable. On DAPT, statin. Exam unremarkable. BP remains elevated s/p multiple doses of labetalol/prn hydralazine and lasix x1. PT/OT without follow up. -Neurology following, appreciate recs -Monitor BP, go up to labetalol 300 mg BID  -Proceed with renal artery duplex given rapid severe range pressures while previously controlled on hypertensive medicines -Continue DAPT, statin -Neuro checks Q4

## 2022-05-20 NOTE — Progress Notes (Addendum)
Daily Progress Note Intern Pager: [phone]  Patient name: [name] [name] Medical record number: [id number] Date of birth: 12/03/1965 Age: 57 y.o. Gender: male  Primary Care Provider: Pcp, No Consultants: ICU, neurology Code Status: FULL  Pt Overview and Major Events to Date:  4/3 - admitted to ICU for SBP >220 4/4 - off nicardipine drip 4/5 - admitted to FMTS  Assessment and Plan:  Moosa Klima is a 57 y.o. 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 He was admitted to the ICU for SBP >220 requiring nicardipine drip but transferred back to the floor once BP improved. Pertinent PMH/PSH includes obesity, OSA, HTN, CKD3b, and gout.  * CVA (cerebral vascular accident) Stable. On DAPT, statin. Exam unremarkable. BP remains elevated s/p multiple doses of labetalol/prn hydralazine and lasix x1. PT/OT without follow up. -Neurology following, appreciate recs -Monitor BP, go up to labetalol 300 mg BID and add back reduced home nifedipine at 30 mg -Continue DAPT, statin -Neuro checks Q4  AKI (acute kidney injury) on CKD3b Cr this morning bumped to 2.35 from 1.87 yesterday (around baseline). Received 1x dose lasix yesterday.  -Hold further doses lasix -Encourage PO -AM BMP  Prolonged QT interval QTC overnight 514 ms per CCM note. K and Mg LLN and replaced. Patient is asymptomatic. -Dc Zofran for now -AM BMP, Mg -Repeat EKG  Other chronic and stable conditions: OSA (continue CPAP)  FEN/GI: Carb modified diet PPx: ASA/plavix Dispo: Home pending clinical improvement .   Subjective:  Feeling anxious this morning about recent CVA.  Is also coming up on the anniversary of his mother's death.  He is wondering about his severe range blood pressures all of a sudden.  He is also had some bilateral back pain intermittently recently.  Objective: Temp:  [98.2 F (36.8 C)-98.6 F (37 C)] 98.3 F (36.8 C)  (04/05 0800) Pulse Rate:  [53-95] 72 (04/05 0900) Resp:  [3-33] 15 (04/05 0900) BP: (139-220)/(77-116) 172/77 (04/05 0900) SpO2:  [91 %-100 %] 95 % (04/05 0900) Physical Exam: General: Alert and oriented, in NAD Skin: Warm, dry HEENT: NCAT, EOM grossly normal, midline nasal septum Cardiac: RRR, no m/r/g appreciated; 1+ bilateral lower extremity edema to the shins with overlying venous stasis changes Respiratory: CTAB, breathing and speaking comfortably on RA Abdominal: Soft, nontender, nondistended, normoactive bowel sounds Extremities: Moves all extremities grossly equally Neurological: Cranial nerves II through XII intact, motor and sensory function normal throughout, gait normal per ICU team report Psychiatric: Anxious mood, normal affect  Laboratory: Most recent CBC Lab Results  Component Value Date   WBC 6.3 05/19/2022   HGB 13.1 05/19/2022   HCT 38.0 (L) 05/19/2022   MCV 88.8 05/19/2022   PLT 213 05/19/2022   Most recent BMP    Latest Ref Rng & Units 05/20/2022    4:24 AM  BMP  Glucose 70 - 99 mg/dL 96   BUN 6 - 20 mg/dL 28   Creatinine 7.01 - 1.24 mg/dL 4.10   Sodium 301 - 314 mmol/L 136   Potassium 3.5 - 5.1 mmol/L 3.8   Chloride 98 - 111 mmol/L 105   CO2 22 - 32 mmol/L 22   Calcium 8.9 - 10.3 mg/dL 9.0    Mg 1.9  Imaging/Diagnostic Tests:  CTA head neck IMPRESSION: 1. No acute intracranial process. The acute infarcts seen on the 05/18/2022 MRI are not apparent on CT. 2. No intracranial large vessel occlusion. Moderate  to severe stenosis in the proximal left V4. Moderate stenosis in the right V4 and left P2. Mild-to-moderate stenosis in the basilar artery. Mild stenosis in the left M1 and right P2. 3. No hemodynamically significant stenosis in the neck. 4. Enlarged thyroid with hypoenhancing nodules, the largest of which measures up to 2.7 cm. If this has not previously been evaluated, a non-emergent ultrasound of the thyroid is recommended. (Reference:  J Am Coll Radiol. 2015 Feb;12(2): 143-50)  Janeal HolmesBranden Posey Jasmin, MD 05/20/2022, 9:52 AM PGY-1, [facility] FPTS Intern pager: [phone], text pages welcome Secure chat group [facility]

## 2022-05-20 NOTE — Progress Notes (Signed)
Patient places self on CPAP.  RT assistance not needed at this time. 

## 2022-05-20 NOTE — Assessment & Plan Note (Addendum)
Cr this morning bumped to 2.35 from 1.87 yesterday (around baseline). Received 1x dose lasix yesterday.  -Hold further doses lasix -Encourage PO -AM BMP -Renal duplex as above

## 2022-05-20 NOTE — Progress Notes (Signed)
eLink Physician-Brief Progress Note Patient Name: [name] [name] DOB: 01/02/1966 MRN: [id number]   Date of Service  05/20/2022  HPI/Events of Note  Hypokalemia  Hypomagnesemia - K+ = 3.8, Mg++ = 1.9 and Creatinine = 2.35.  eICU Interventions  Will replace K+ and Mg++.      Intervention Category Major Interventions: Electrolyte abnormality - evaluation and management  [name] 05/20/2022, 6:02 AM

## 2022-05-20 NOTE — Progress Notes (Signed)
STROKE TEAM PROGRESS NOTE   SUBJECTIVE (INTERVAL HISTORY) No acute event overnight, neuro intact.  Off IV BP meds, transfer out of ICU.   OBJECTIVE Temp:  [97.8 F (36.6 C)-98.4 F (36.9 C)] 97.8 F (36.6 C) (04/05 1212) Pulse Rate:  [53-95] 65 (04/05 1212) Cardiac Rhythm: Normal sinus rhythm;Bundle branch block (04/05 1025) Resp:  [3-25] 18 (04/05 1212) BP: (162-220)/(77-116) 190/93 (04/05 1212) SpO2:  [91 %-100 %] 95 % (04/05 1212)  Recent Labs  Lab 05/19/22 1112 05/19/22 1519 05/19/22 2141 05/20/22 0811 05/20/22 1218  GLUCAP 117* 149* 115* 115* 161*   Recent Labs  Lab 05/18/22 0851 05/19/22 0441 05/19/22 2122 05/20/22 0424  NA 136 134* 136 136  K 3.9 3.4* 3.6 3.8  CL 105 103 103 105  CO2 22 24 24 22   GLUCOSE 136* 94 132* 96  BUN 25* 22* 30* 28*  CREATININE 1.85* 1.87* 2.26* 2.35*  CALCIUM 9.8 9.0 9.2 9.0  MG  --   --  2.1 1.9   Recent Labs  Lab 05/18/22 0851  AST 26  ALT 18  ALKPHOS 59  BILITOT 0.9  PROT 7.3  ALBUMIN 3.9   Recent Labs  Lab 05/18/22 0851 05/18/22 0906 05/19/22 0441  WBC 5.8  --  6.3  NEUTROABS  --  3.9  --   HGB 13.4  --  13.1  HCT 40.5  --  38.0*  MCV 89.8  --  88.8  PLT 205  --  213   No results for input(s): "CKTOTAL", "CKMB", "CKMBINDEX", "TROPONINI" in the last 168 hours. Recent Labs    05/18/22 0906  LABPROT 12.2  INR 0.9   Recent Labs    05/18/22 0906  COLORURINE COLORLESS*  LABSPEC <1.005*  PHURINE 6.0  GLUCOSEU NEGATIVE  HGBUR SMALL*  BILIRUBINUR NEGATIVE  KETONESUR NEGATIVE  PROTEINUR 30*  NITRITE NEGATIVE  LEUKOCYTESUR NEGATIVE       Component Value Date/Time   CHOL 297 (H) 05/19/2022 0441   TRIG 145 05/19/2022 0441   HDL 52 05/19/2022 0441   CHOLHDL 5.7 05/19/2022 0441   VLDL 29 05/19/2022 0441   LDLCALC 216 (H) 05/19/2022 0441   Lab Results  Component Value Date   HGBA1C 6.2 (H) 05/19/2022      Component Value Date/Time   LABOPIA NONE DETECTED 05/18/2022 0906   COCAINSCRNUR NONE  DETECTED 05/18/2022 0906   LABBENZ NONE DETECTED 05/18/2022 0906   AMPHETMU NONE DETECTED 05/18/2022 0906   THCU NONE DETECTED 05/18/2022 0906   LABBARB NONE DETECTED 05/18/2022 0906    Recent Labs  Lab 05/18/22 0906  ETH <10    I have personally reviewed the radiological images below and agree with the radiology interpretations.  ECHOCARDIOGRAM COMPLETE  Result Date: 05/20/2022    ECHOCARDIOGRAM REPORT   Patient Name:   AZAIAH LICCIARDI Date of Exam: 05/19/2022 Medical Rec #:  [id number]  Height:       72.0 in Accession #:    [id number] Weight:       304.7 lb Date of Birth:  1966/02/03  BSA:          2.548 m Patient Age:    57 years   BP:           184/76 mmHg Patient Gender: M          HR:           59 bpm. Exam Location:  Inpatient Procedure: 2D Echo, Cardiac Doppler, Color Doppler and Intracardiac  Opacification Agent Indications:    Stroke  History:        Patient has no prior history of Echocardiogram examinations.                 Stroke; Risk Factors:Hypertension.  Sonographer:    Lucy Antigua Referring Phys: [id number] Lorin Glass IMPRESSIONS  1. Left ventricular ejection fraction, by estimation, is 60 to 65%. The left ventricle has normal function. The left ventricle has no regional wall motion abnormalities. Left ventricular diastolic parameters are consistent with Grade I diastolic dysfunction (impaired relaxation). Elevated left ventricular end-diastolic pressure.  2. Right ventricular systolic function is normal. The right ventricular size is normal.  3. Left atrial size was moderately dilated.  4. The mitral valve is normal in structure. No evidence of mitral valve regurgitation. No evidence of mitral stenosis.  5. The aortic valve was not well visualized. Aortic valve regurgitation is not visualized. No aortic stenosis is present.  6. The inferior vena cava is normal in size with greater than 50% respiratory variability, suggesting right atrial pressure of 3 mmHg. FINDINGS  Left  Ventricle: Left ventricular ejection fraction, by estimation, is 60 to 65%. The left ventricle has normal function. The left ventricle has no regional wall motion abnormalities. The left ventricular internal cavity size was normal in size. There is  no left ventricular hypertrophy. Left ventricular diastolic parameters are consistent with Grade I diastolic dysfunction (impaired relaxation). Elevated left ventricular end-diastolic pressure. Right Ventricle: The right ventricular size is normal. No increase in right ventricular wall thickness. Right ventricular systolic function is normal. Left Atrium: Left atrial size was moderately dilated. Right Atrium: Right atrial size was normal in size. Pericardium: There is no evidence of pericardial effusion. Mitral Valve: The mitral valve is normal in structure. No evidence of mitral valve regurgitation. No evidence of mitral valve stenosis. Tricuspid Valve: The tricuspid valve is normal in structure. Tricuspid valve regurgitation is not demonstrated. No evidence of tricuspid stenosis. Aortic Valve: The aortic valve was not well visualized. Aortic valve regurgitation is not visualized. No aortic stenosis is present. Aortic valve mean gradient measures 4.0 mmHg. Aortic valve peak gradient measures 8.0 mmHg. Aortic valve area, by VTI measures 4.35 cm. Pulmonic Valve: The pulmonic valve was normal in structure. Pulmonic valve regurgitation is not visualized. No evidence of pulmonic stenosis. Aorta: The aortic root is normal in size and structure. Venous: The inferior vena cava is normal in size with greater than 50% respiratory variability, suggesting right atrial pressure of 3 mmHg. IAS/Shunts: No atrial level shunt detected by color flow Doppler.  LEFT VENTRICLE PLAX 2D LVOT diam:     2.50 cm   Diastology LV SV:         100       LV e' medial:    2.83 cm/s LV SV Index:   39        LV E/e' medial:  34.7 LVOT Area:     4.91 cm  LV e' lateral:   2.61 cm/s                           LV E/e' lateral: 37.6  RIGHT VENTRICLE RV S prime:     18.90 cm/s LEFT ATRIUM           Index        RIGHT ATRIUM           Index LA Vol (A4C): 88.5 ml 34.73 ml/m  RA Area:     19.80 cm                                    RA Volume:   49.80 ml  19.55 ml/m  AORTIC VALVE AV Area (Vmax):    3.29 cm AV Area (Vmean):   3.08 cm AV Area (VTI):     4.35 cm AV Vmax:           141.00 cm/s AV Vmean:          98.500 cm/s AV VTI:            0.230 m AV Peak Grad:      8.0 mmHg AV Mean Grad:      4.0 mmHg LVOT Vmax:         94.60 cm/s LVOT Vmean:        61.800 cm/s LVOT VTI:          0.204 m LVOT/AV VTI ratio: 0.89  AORTA Ao Asc diam: 3.50 cm MITRAL VALVE MV Area (PHT): 3.10 cm     SHUNTS MV Decel Time: 245 msec     Systemic VTI:  0.20 m MV E velocity: 98.10 cm/s   Systemic Diam: 2.50 cm MV A velocity: 101.00 cm/s MV E/A ratio:  0.97 Chilton Si MD Electronically signed by Chilton Si MD Signature Date/Time: 05/20/2022/12:14:02 AM    Final    CT ANGIO HEAD NECK W WO CM  Result Date: 05/19/2022 CLINICAL DATA:  Acute infarcts on 05/18/2022 MRI EXAM: CT ANGIOGRAPHY HEAD AND NECK TECHNIQUE: Multidetector CT imaging of the head and neck was performed using the standard protocol during bolus administration of intravenous contrast. Multiplanar CT image reconstructions and MIPs were obtained to evaluate the vascular anatomy. Carotid stenosis measurements (when applicable) are obtained utilizing NASCET criteria, using the distal internal carotid diameter as the denominator. RADIATION DOSE REDUCTION: This exam was performed according to the departmental dose-optimization program which includes automated exposure control, adjustment of the mA and/or kV according to patient size and/or use of iterative reconstruction technique. CONTRAST:  21mL OMNIPAQUE IOHEXOL 350 MG/ML SOLN COMPARISON:  No prior CTA, correlation is made with 05/18/2022 CT head and MRI head FINDINGS: CT HEAD FINDINGS Brain: No evidence of acute infarct,  hemorrhage, mass, mass effect, or midline shift. No hydrocephalus or extra-axial fluid collection. No hypodensity is seen to correlate with the acute infarcts on the 05/18/2022 MRI. Vascular: No hyperdense vessel. Skull: Negative for fracture or focal lesion. Sinuses/Orbits: Minimal mucosal thickening in the ethmoid air cells. No acute finding in the orbits. Other: The mastoid air cells are well aerated. CTA NECK FINDINGS Aortic arch: Two-vessel arch with a common origin of the brachiocephalic and left common carotid arteries. Imaged portion shows no evidence of aneurysm or dissection. No significant stenosis of the major arch vessel origins. Right carotid system: No evidence of dissection, occlusion, or hemodynamically significant stenosis (greater than 50%). Left carotid system: No evidence of dissection, occlusion, or hemodynamically significant stenosis (greater than 50%). Vertebral arteries: Left dominant system. No evidence of dissection, occlusion, or hemodynamically significant stenosis (greater than 50%). Skeleton: No acute osseous abnormality. Other neck: Enlarged thyroid, with hypoenhancing nodules, the largest of which measures up to 2.7 cm. Upper chest: No focal pulmonary opacity or pleural effusion. Review of the MIP images confirms the above findings CTA HEAD FINDINGS Anterior circulation: Both internal carotid arteries are patent to the termini, without  significant stenosis. A1 segments patent. Normal anterior communicating artery. Anterior cerebral arteries are patent to their distal aspects without significant stenosis. Mild stenosis in the distal left M1 (series 12, image 100-103). No significant stenosis in the right M1. MCA branches perfused to their distal aspects without significant stenosis. Posterior circulation: The right vertebral artery primarily supplies the right PICA. Moderate stenosis in the distal right V4 (series 12, image 131 and 138). The left vertebral artery is patent to the  vertebrobasilar junction, with moderate to severe stenosis proximally (series 12, image 148). Basilar patent to its distal aspect with mild-to-moderate stenosis (series 12, image 132 and 122, although the basilar does not provide significant contribution to the PCAs. Superior cerebellar arteries patent proximally. Patent, hypoplastic P1 segments. Near fetal origin of the bilateral PCAs from the posterior communicating arteries. Mild stenosis in the proximal right P2 (series 12, image 102) and distal right P2 (series 12, image 24). Moderate stenosis in the distal left P2 (series 12, images 10 1-102 Venous sinuses: As permitted by contrast timing, patent. Anatomic variants: Near fetal origin of the bilateral PCAs. Review of the MIP images confirms the above findings IMPRESSION: 1. No acute intracranial process. The acute infarcts seen on the 05/18/2022 MRI are not apparent on CT. 2. No intracranial large vessel occlusion. Moderate to severe stenosis in the proximal left V4. Moderate stenosis in the right V4 and left P2. Mild-to-moderate stenosis in the basilar artery. Mild stenosis in the left M1 and right P2. 3. No hemodynamically significant stenosis in the neck. 4. Enlarged thyroid with hypoenhancing nodules, the largest of which measures up to 2.7 cm. If this has not previously been evaluated, a non-emergent ultrasound of the thyroid is recommended. (Reference: J Am Coll Radiol. 2015 Feb;12(2): 143-50) Electronically Signed   By: Wiliam KeAlison  Vasan M.D.   On: 05/19/2022 01:43   MR Brain Wo Contrast (neuro protocol)  Result Date: 05/18/2022 CLINICAL DATA:  Neuro deficit, stroke suspected. EXAM: MRI HEAD WITHOUT CONTRAST TECHNIQUE: Multiplanar, multiecho pulse sequences of the brain and surrounding structures were obtained without intravenous contrast. COMPARISON:  Same-day CT head, brain MRI 06/15/2021 FINDINGS: Brain: There is a small focus of diffusion restriction in the right inferomedial frontal lobe cortex  consistent with a punctate acute infarct. There is an additional small focus of diffusion restriction in the right temporoparietal cortex consistent with additional punctate acute infarct (2-30). There is no associated hemorrhage or mass effect. There is more ill-defined DWI signal abnormality in the left lentiform nucleus with associated FLAIR signal abnormality which may reflect an additional subacute infarct, also without hemorrhage or mass effect. There is no other evidence of infarct. There is no acute intracranial hemorrhage or extra-axial fluid collection. Parenchymal volume is normal. The ventricles are normal in size. Mild FLAIR signal abnormality in the supratentorial white matter likely reflects sequela of mild chronic small-vessel ischemic change. There are punctate chronic microhemorrhages in the left cerebral peduncle and right cerebellar hemisphere, nonspecific but new since 2023. The pituitary and suprasellar region are normal. There is no solid mass lesion. There is no mass effect or midline shift. Vascular: Normal flow voids. Skull and upper cervical spine: Normal marrow signal. Sinuses/Orbits: There is mild mucosal thickening in the paranasal sinuses. The globes and orbits are unremarkable. Other: None. IMPRESSION: Punctate acute infarcts in the right inferomedial frontal lobe and temporoparietal cortex, and likely subacute infarct in the left lentiform nucleus. No hemorrhage or mass effect. Electronically Signed   By: Selena LesserPeter  Noone M.D.  On: 05/18/2022 18:13   CT HEAD WO CONTRAST  Result Date: 05/18/2022 CLINICAL DATA:  57 year old male with dizziness for 4 days. EXAM: CT HEAD WITHOUT CONTRAST TECHNIQUE: Contiguous axial images were obtained from the base of the skull through the vertex without intravenous contrast. RADIATION DOSE REDUCTION: This exam was performed according to the departmental dose-optimization program which includes automated exposure control, adjustment of the mA and/or kV  according to patient size and/or use of iterative reconstruction technique. COMPARISON:  Brain MRI 06/15/2021. FINDINGS: Brain: Stable cerebral volume from the MRI last year. No midline shift, ventriculomegaly, mass effect, evidence of mass lesion, intracranial hemorrhage or evidence of cortically based acute infarction. Gray-white matter differentiation is within normal limits throughout the brain. Vascular: Mild calcified atherosclerosis at the skull base. No suspicious intracranial vascular hyperdensity. Skull: Negative. Sinuses/Orbits: Tympanic cavities, visualized paranasal sinuses, and mastoids are stable and well aerated. Other: No acute orbit or scalp soft tissue finding. IMPRESSION: Normal for age non contrast CT appearance of the brain, appears stable to MRI last year. Electronically Signed   By: Odessa FlemingH  Hall M.D.   On: 05/18/2022 09:41     PHYSICAL EXAM  Temp:  [97.8 F (36.6 C)-98.4 F (36.9 C)] 97.8 F (36.6 C) (04/05 1212) Pulse Rate:  [53-95] 65 (04/05 1212) Resp:  [3-25] 18 (04/05 1212) BP: (162-220)/(77-116) 190/93 (04/05 1212) SpO2:  [91 %-100 %] 95 % (04/05 1212)  General - Well nourished, well developed, in no apparent distress.  Ophthalmologic - fundi not visualized due to noncooperation.  Cardiovascular - Regular rhythm and rate.  Mental Status -  Level of arousal and orientation to time, place, and person were intact. Language including expression, naming, repetition, comprehension was assessed and found intact. Fund of Knowledge was assessed and was intact.  Cranial Nerves II - XII - II - Visual field intact OU. III, IV, VI - Extraocular movements intact. V - Facial sensation intact bilaterally. VII - Facial movement intact bilaterally. VIII - Hearing & vestibular intact bilaterally. X - Palate elevates symmetrically. XI - Chin turning & shoulder shrug intact bilaterally. XII - Tongue protrusion intact.  Motor Strength - The patient's strength was normal in all  extremities and pronator drift was absent.  Bulk was normal and fasciculations were absent.   Motor Tone - Muscle tone was assessed at the neck and appendages and was normal.  Reflexes - The patient's reflexes were symmetrical in all extremities and he had no pathological reflexes.  Sensory - Light touch, temperature/pinprick were assessed and were symmetrical.    Coordination - The patient had normal movements in the hands and feet with no ataxia or dysmetria.  Tremor was absent.  Gait and Station - deferred.   ASSESSMENT/PLAN Mr. Annitta Jerseyracy Haddon is a 57 y.o. male with history of HTN, HLD, CKD, obesity admitted for dizziness for 4 days with worsening and imbalance. BP was high in ED, admitted to ICU for IV BP meds. No tPA given due to outside window.    Hypertensive urgency BP high on presentation Now o off cleviprex On home cozaar and labetalol now Gradually normalize BP in 2-3 days Long term BP goal normotensive   Stroke:  3 small infarcts at right inferior frontal, right temproparietal cortex, and left BG infarct, more likely synchronized small vessel disease but can not rule out cardioembolic source completely CT no acute finding CTA head and neck b/l V4 and left P2 mod to severe stenosis, mild to mod stenosis of BA MRI Punctate acute infarcts  in the right inferomedial frontal lobe and temporoparietal cortex, and likely subacute infarct in the left lentiform nucleus. 2D Echo EF 60 to 65% Recommend 30-day CardioNet monitoring as outpatient to rule out A-fib LDL 216 HgbA1c 6.2 UDS neg SCDs for VTE prophylaxis No antithrombotic prior to admission, now on aspirin 81 mg daily and clopidogrel 75 mg daily for 3 weeks and then ASA alone Patient counseled to be compliant with his antithrombotic medications Ongoing aggressive stroke risk factor management Therapy recommendations:  pending Disposition:  pending  Hyperlipidemia Home meds:  none, off crestor LDL 216, goal < 70 Now on  crestor 40 Continue statin at discharge  Other Stroke Risk Factors Obesity, Body mass index is 41.32 kg/m.   Other Active Problems AKI on CKD IIIb, Cre 1.85->1.87-2.26-2.35, may related to Lasix use yesterday, management per primary team  Hospital day # 2  Neurology will sign off. Please call with questions. Pt will follow up with stroke clinic NP at [facility] in about 4 weeks. Thanks for the consult.   Marvel Plan, MD PhD Stroke Neurology 05/20/2022 4:29 PM    To contact Stroke Continuity provider, please refer to [email or web address]. After hours, contact General Neurology

## 2022-05-21 DIAGNOSIS — I639 Cerebral infarction, unspecified: Secondary | ICD-10-CM | POA: Diagnosis not present

## 2022-05-21 DIAGNOSIS — R42 Dizziness and giddiness: Secondary | ICD-10-CM | POA: Diagnosis not present

## 2022-05-21 DIAGNOSIS — I10 Essential (primary) hypertension: Principal | ICD-10-CM

## 2022-05-21 LAB — BASIC METABOLIC PANEL
Anion gap: 12 (ref 5–15)
BUN: 34 mg/dL — ABNORMAL HIGH (ref 6–20)
CO2: 22 mmol/L (ref 22–32)
Calcium: 9.1 mg/dL (ref 8.9–10.3)
Chloride: 102 mmol/L (ref 98–111)
Creatinine, Ser: 2.4 mg/dL — ABNORMAL HIGH (ref 0.61–1.24)
GFR, Estimated: 31 mL/min — ABNORMAL LOW (ref >60)
Glucose, Bld: 89 mg/dL (ref 70–99)
Potassium: 3.7 mmol/L (ref 3.5–5.1)
Sodium: 136 mmol/L (ref 135–145)

## 2022-05-21 LAB — PHOSPHORUS: Phosphorus: 4.7 mg/dL — ABNORMAL HIGH (ref 2.5–4.6)

## 2022-05-21 LAB — GLUCOSE, CAPILLARY
Glucose-Capillary: 90 mg/dL (ref 70–99)
Glucose-Capillary: 153 mg/dL — ABNORMAL HIGH (ref 70–99)
Glucose-Capillary: 102 mg/dL — ABNORMAL HIGH (ref 70–99)

## 2022-05-21 LAB — MAGNESIUM: Magnesium: 2.1 mg/dL (ref 1.7–2.4)

## 2022-05-21 MED ORDER — NIFEDIPINE ER OSMOTIC RELEASE 60 MG PO TB24
60.0000 mg | ORAL_TABLET | Freq: Every day | ORAL | Status: DC
  Administered 2022-05-21: 60 mg via ORAL
  Filled 2022-05-21: qty 1

## 2022-05-21 MED ORDER — NIFEDIPINE ER OSMOTIC RELEASE 60 MG PO TB24
90.0000 mg | ORAL_TABLET | Freq: Every day | ORAL | Status: DC
  Administered 2022-05-22: 90 mg via ORAL
  Filled 2022-05-21: qty 1

## 2022-05-21 MED ORDER — NIFEDIPINE ER OSMOTIC RELEASE 30 MG PO TB24
30.0000 mg | ORAL_TABLET | Freq: Once | ORAL | Status: AC
  Administered 2022-05-21: 30 mg via ORAL
  Filled 2022-05-21: qty 1

## 2022-05-21 NOTE — Assessment & Plan Note (Addendum)
BP remains elevated (Mostly SBP 180-200 overnight) while on PO Labetalol, Losartan, Nifedipine. Plan is to gradually normalize BP over 2-3 days. - Continue Losartan 100 mg daily - Continue Labetolol 300 mg BID - Increase Nifedpine to 60 mg daily

## 2022-05-21 NOTE — Plan of Care (Signed)

## 2022-05-21 NOTE — Plan of Care (Signed)
  Problem: Education: Goal: Knowledge of disease or condition will improve Outcome: Progressing   Problem: Coping: Goal: Will identify appropriate support needs Outcome: Progressing   Problem: Skin Integrity: Goal: Risk for impaired skin integrity will decrease Outcome: Progressing

## 2022-05-21 NOTE — Progress Notes (Signed)
Pt said wear cpap when ready. Pt self administer cpap.

## 2022-05-21 NOTE — Progress Notes (Signed)
   05/20/22 2100  Assess: if the MEWS score is Yellow or Red  MEWS guidelines implemented  Yes, yellow  Treat  MEWS Interventions Considered administering scheduled or prn medications/treatments as ordered  Take Vital Signs  Increase Vital Sign Frequency  Yellow: Q2hr x1, continue Q4hrs until patient remains green for 12hrs  Escalate  MEWS: Escalate Yellow: Discuss with charge nurse and consider notifying provider and/or RRT  Provider Notification  Provider Name/Title Dameron, DO  Date Provider Notified 05/20/22  Time Provider Notified 2020  Method of Notification  (secure message)  Notification Reason Other (Comment) (increased bp)  Provider response Other (Comment) (administer scheduled bp med and monitor vitals q4)  Date of Provider Response 05/20/22  Time of Provider Response 2021

## 2022-05-21 NOTE — Progress Notes (Addendum)
     Daily Progress Note Intern Pager: [phone]  Patient name: [name] [name] Medical record number: [id number] Date of birth: 02/06/1966 Age: 57 y.o. Gender: male  Primary Care Provider: Pcp, No Consultants: Neurology, PCCM Code Status: FULL  Pt Overview and Major Events to Date:  4/3 - admitted to ICU for SBP >220 requiring nicardipine gtt 4/4 - off nicardipine gtt 4/5 - admitted to FMTS  Assessment and Plan: [name] [name] is a 57 y.o. 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 Now stable on PO antihypertensives. Pertinent PMH/PSH includes obesity, OSA, HTN, CKD3b, and gout. .   * CVA (cerebral vascular accident) Stable. No neuro changes.  Exam unremarkable.  - Increase Nifedipine to 60 mg daily -Neurology signed off- pt to f/u in 4 weeks in stroke clinic. Will have 30 day monitoring o/p to r/o a fib. -Continue DAPT with ASA and Plavix x3 weeks, continue statin -Neuro checks Q4  Hypertensive emergency BP remains elevated (Mostly SBP 180-200 overnight) while on PO Labetalol, Losartan, Nifedipine. Plan is to gradually normalize BP over 2-3 days. - Continue Losartan 100 mg daily - Continue Labetolol 300 mg BID - Increase Nifedpine to 60 mg daily   AKI (acute kidney injury) on CKD3b Cr increased yet again to 2.40 from 2.35 yesterday (1.87 baseline).  Likely pre-renal with decreased PO intake, also thought to be worsened by Lasix -Hold further doses lasix -Encourage PO -AM BMP - Monitor UOP   FEN/GI: Carb modified PPx: On Plavix Dispo:Home, pending further clinical improvement  Subjective:  [name] [name] was resting comfortably in his bed with his CPAP on when I arrived in the room. He awakes easily, and does not have any complaints other than "a sharp poking sensation in the side of my head" that comes and goes. Discussed with nighttime RN, he does not have great p.o. intake but he is  independently walking around on his own, sat in the chair for a while and she will give him a urinal so we can monitor his urine output.  Objective: Temp:  [97.8 F (36.6 C)-98.7 F (37.1 C)] 98.7 F (37.1 C) (04/05 2234) Pulse Rate:  [57-73] 73 (04/06 0624) Resp:  [15-24] 17 (04/06 0624) BP: (172-213)/(77-104) 206/92 (04/06 0624) SpO2:  [91 %-98 %] 98 % (04/06 9470) Physical Exam: General: Resting comfortably in bed with CPAP on Cardiovascular: Regular rate and rhythm Respiratory: Normal work of breathing on room air Abdomen: Obese, nontender nondistended Extremities: Warm and well-perfused  Laboratory: Most recent CBC Lab Results  Component Value Date   WBC 6.3 05/19/2022   HGB 13.1 05/19/2022   HCT 38.0 (L) 05/19/2022   MCV 88.8 05/19/2022   PLT 213 05/19/2022   Most recent BMP    Latest Ref Rng & Units 05/21/2022    4:09 AM  BMP  Glucose 70 - 99 mg/dL 89   BUN 6 - 20 mg/dL 34   Creatinine 9.62 - 1.24 mg/dL 8.36   Sodium 629 - 476 mmol/L 136   Potassium 3.5 - 5.1 mmol/L 3.7   Chloride 98 - 111 mmol/L 102   CO2 22 - 32 mmol/L 22   Calcium 8.9 - 10.3 mg/dL 9.1     Darral Dash, DO 05/21/2022, 6:39 AM  PGY-2, [facility] FPTS Intern pager: [phone], text pages welcome Secure chat group [facility] Teaching Service

## 2022-05-22 DIAGNOSIS — I639 Cerebral infarction, unspecified: Secondary | ICD-10-CM | POA: Diagnosis not present

## 2022-05-22 DIAGNOSIS — R7989 Other specified abnormal findings of blood chemistry: Secondary | ICD-10-CM

## 2022-05-22 DIAGNOSIS — I10 Essential (primary) hypertension: Secondary | ICD-10-CM | POA: Diagnosis not present

## 2022-05-22 LAB — CBC
HCT: 40.3 % (ref 39.0–52.0)
Hemoglobin: 12.9 g/dL — ABNORMAL LOW (ref 13.0–17.0)
MCH: 29.7 pg (ref 26.0–34.0)
MCHC: 32 g/dL (ref 30.0–36.0)
MCV: 92.9 fL (ref 80.0–100.0)
Platelets: 210 10*3/uL (ref 150–400)
RBC: 4.34 MIL/uL (ref 4.22–5.81)
RDW: 14.2 % (ref 11.5–15.5)
WBC: 5.8 10*3/uL (ref 4.0–10.5)
nRBC: 0 % (ref 0.0–0.2)

## 2022-05-22 LAB — BASIC METABOLIC PANEL
Anion gap: 8 (ref 5–15)
BUN: 31 mg/dL — ABNORMAL HIGH (ref 6–20)
CO2: 23 mmol/L (ref 22–32)
Calcium: 8.9 mg/dL (ref 8.9–10.3)
Chloride: 104 mmol/L (ref 98–111)
Creatinine, Ser: 2.61 mg/dL — ABNORMAL HIGH (ref 0.61–1.24)
GFR, Estimated: 28 mL/min — ABNORMAL LOW (ref >60)
Glucose, Bld: 131 mg/dL — ABNORMAL HIGH (ref 70–99)
Potassium: 3.9 mmol/L (ref 3.5–5.1)
Sodium: 135 mmol/L (ref 135–145)

## 2022-05-22 LAB — GLUCOSE, CAPILLARY
Glucose-Capillary: 137 mg/dL — ABNORMAL HIGH (ref 70–99)
Glucose-Capillary: 142 mg/dL — ABNORMAL HIGH (ref 70–99)
Glucose-Capillary: 94 mg/dL (ref 70–99)

## 2022-05-22 MED ORDER — LABETALOL HCL 300 MG PO TABS: 300.0000 mg | ORAL_TABLET | Freq: Two times a day (BID) | ORAL | 0 refills | Status: DC

## 2022-05-22 MED ORDER — ROSUVASTATIN CALCIUM 40 MG PO TABS: 40.0000 mg | ORAL_TABLET | Freq: Every day | ORAL | 0 refills | Status: AC

## 2022-05-22 MED ORDER — VITAMIN D 25 MCG (1000 UNIT) PO TABS: 1000.0000 [IU] | ORAL_TABLET | Freq: Every day | ORAL | 9 refills | Status: DC

## 2022-05-22 MED ORDER — NIFEDIPINE ER OSMOTIC RELEASE 90 MG PO TB24: 90.0000 mg | ORAL_TABLET | Freq: Every day | ORAL | 0 refills | Status: AC

## 2022-05-22 MED ORDER — ASPIRIN 81 MG PO TBEC: 81.0000 mg | DELAYED_RELEASE_TABLET | Freq: Every day | ORAL | 0 refills | Status: AC

## 2022-05-22 MED ORDER — CLOPIDOGREL BISULFATE 75 MG PO TABS: 75.0000 mg | ORAL_TABLET | Freq: Every day | ORAL | 0 refills | Status: DC

## 2022-05-22 NOTE — Discharge Summary (Signed)
Family Medicine Teaching [facility] Discharge Summary  Patient name: [name] [name] Medical record number: [id number] Date of birth: 03/12/1965 Age: 57 y.o. Gender: male Date of Admission: 05/18/2022  Date of Discharge:05/22/2022 Admitting Physician: Lorin Glass, MD  Primary Care Provider: Eartha Inch, MD Consultants: CCM, neurology  Indication for Hospitalization: Acute stroke  Discharge Diagnoses/Problem List:  Principal Problem:   CVA (cerebral vascular accident) Active Problems:   Hypertensive emergency   AKI (acute kidney injury) on CKD3b   Gait abnormality   At risk of diabetes mellitus   Chronic kidney disease (CKD) stage G3b/A2, moderately decreased glomerular filtration rate (GFR) between 30-44 mL/min/1.73 square meter and albuminuria creatinine ratio between 30-299 mg/g   Primary hypertension   Dizziness    Disposition: Home  Discharge Condition: Stable  Discharge Exam: Taken from daily progress note General: Alert and oriented, NAD, wearing CPAP Cardiovascular: RRR, no murmurs auscultated Respiratory: CTAB, normal WOB Neuro: Appropriately conversational, moves all extremities equally and appropriately Extremities: +1 pitting edema of BLEs   Brief Hospital Course:  [name] [name] is a 57 y.o. male who was admitted to the [facility] Teaching Service at [employer] for acute CVA. History is significant for obesity, OSA, HTN, CKD3b, and gout. Hospital course is outlined below by problem.   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 BP noted to be severe range at 220/113. Hgb A1c 6.2. CT head negative. MRI demonstrated right frontal and temporal parietal cortex as well as subacute left lentiform nucleus infarcts. CTA with mod-severe stenosis bilateral V4 and left P2. No tPA given as he was outside window. He was started on cardene drip for BP  and admitted to ICU for close monitoring of BP with BP goal less than 180/105 and to gradually normalize BP over the course of several days.  Neurology was consulted and recommended ASA/plavix for 3 weeks followed by ASA alone as well as Crestor.  PT/OT without follow up. Echo demonstrated LVEF55-60% with G1DD, no LV wall motion abnormalities. Neurology recommended outpatient follow-up in about 4 weeks and 30-day CardioNet monitoring outpatient to rule out A-fib. He was weaned off cardene drip on 4/4 and admitted to FMTS 4/5 for SBP <180. He ultimately managed with Labetolol 300 mg BID and Nifedipine 90 mg daily and losartan.  The losartan was not continued at discharge due to elevated creatinine.   Hyperlipidemia Lipid panel showed LDL 216; goal is <70. Started on Crestor 40 mg daily.  Other conditions that were chronic and stable: OSA (continued CPAP), CKD3b (improved with fluids), gout (continued allopurinol).  Issues for follow up: Ensure continued statin use and ASA alone after 3 week period  Ensure follow-up with stroke clinic in 4 weeks Neurology recommended 30-day CardioNet monitoring outpatient to rule out A-fib Assess functional status  Enlarged thyroid with hypoenhancing nodules, the largest of which measures up to 2.7 cm, thyroid ultrasound was recommended Consider nephrology referral for worsening kidney disease Discharged off of losartan due to creatinine 2.61 Follow-up BMP to check kidney function Monitor BP, gradually normalize   Significant Procedures: None  Significant Labs and Imaging:  Recent Labs  Lab 05/18/22 0851 05/19/22 0441 05/22/22 0940  WBC 5.8 6.3 5.8  HGB 13.4 13.1 12.9*  HCT 40.5 38.0* 40.3  PLT 205 213 210   Recent Labs  Lab 05/18/22 0851 05/19/22 0441 05/19/22 2122 05/20/22 0424 05/21/22 0409 05/22/22 0940  NA 136 134*  136 136 136 135  K 3.9 3.4* 3.6 3.8 3.7 3.9  CL 105 103 103 105 102 104  CO2 22 24 24 22 22 23   GLUCOSE 136* 94 132*  96 89 131*  BUN 25* 22* 30* 28* 34* 31*  CREATININE 1.85* 1.87* 2.26* 2.35* 2.40* 2.61*  CALCIUM 9.8 9.0 9.2 9.0 9.1 8.9  MG  --   --  2.1 1.9 2.1  --   PHOS  --   --   --   --  4.7*  --   ALKPHOS 59  --   --   --   --   --   AST 26  --   --   --   --   --   ALT 18  --   --   --   --   --   ALBUMIN 3.9  --   --   --   --   --       Results/Tests Pending at Time of Discharge: None  Discharge Medications:  Allergies as of 05/22/2022       Reactions   Hydralazine Palpitations   Per patient medication makes his heart feel like it is pounding   Amlodipine Rash   Prednisone Hives        Medication List     STOP taking these medications    furosemide 20 MG tablet Commonly known as: LASIX   GARLIC PO   olmesartan 40 MG tablet Commonly known as: BENICAR   Vitamin D3 25 MCG (1000 UT) capsule Generic drug: Cholecalciferol Replaced by: cholecalciferol 25 MCG (1000 UNIT) tablet       TAKE these medications    aspirin EC 81 MG tablet Take 1 tablet (81 mg total) by mouth daily. Swallow whole.   blood glucose meter kit and supplies Kit Dispense based on patient and insurance preference. Use up to four times daily as directed.   cholecalciferol 25 MCG (1000 UNIT) tablet Commonly known as: VITAMIN D3 Take 1 tablet (1,000 Units total) by mouth daily. Replaces: Vitamin D3 25 MCG (1000 UT) capsule   clopidogrel 75 MG tablet Commonly known as: PLAVIX Take 1 tablet (75 mg total) by mouth daily.   glucose blood test strip Use as instructed   labetalol 300 MG tablet Commonly known as: NORMODYNE Take 1 tablet (300 mg total) by mouth 2 (two) times daily. What changed:  medication strength how much to take   NIFEdipine 90 MG 24 hr tablet Commonly known as: PROCARDIA XL/NIFEDICAL-XL Take 1 tablet (90 mg total) by mouth daily. Start taking on: May 23, 2022   rosuvastatin 40 MG tablet Commonly known as: CRESTOR Take 1 tablet (40 mg total) by mouth at bedtime. What  changed:  medication strength how much to take        Discharge Instructions: Please refer to Patient Instructions section of EMR for full details.  Patient was counseled important signs and symptoms that should prompt return to medical care, changes in medications, dietary instructions, activity restrictions, and follow up appointments.   Follow-Up Appointments:  Follow-up Information     [facility]. Schedule an appointment as soon as possible for a visit in 1 month(s).   Specialty: Neurology Why: stroke clinic Contact information: [address] [place] [address] [phone]                Erick Alley, DO 05/22/2022, 4:33 PM PGY-2, [facility]

## 2022-05-22 NOTE — Progress Notes (Signed)
     Daily Progress Note Intern Pager: [phone]  Patient name: [name] [name] Medical record number: [id number] Date of birth: [birth year] Age: 57 y.o. Gender: male  Primary Care Provider: Pcp, No Consultants: Neurology, PCCM Code Status: Full  Pt Overview and Major Events to Date:  4/3 - admitted to ICU for SBP >220 requiring nicardipine gtt 4/4 - off nicardipine gtt 4/5 - admitted to FMTS  Assessment and Plan: [name] [name] is a 57 y.o. 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 Now stable on PO antihypertensives. Pertinent PMH/PSH includes obesity, OSA, HTN, CKD3b, and gout. .   * CVA (cerebral vascular accident) Stable. No neuro changes.  Exam unremarkable.  - Continue Nifedipine 90mg  daily -Neurology signed off- pt to f/u in 4 weeks in stroke clinic. Will have 30 day monitoring o/p to r/o a fib. -Continue DAPT with ASA and Plavix x3 weeks, continue statin -Neuro checks Q4  Hypertensive emergency BP improved while on PO Labetalol, Losartan, Nifedipine. Plan is to gradually normalize BP over 2-3 days. - Continue Losartan 100 mg daily - Continue Labetolol 300 mg BID - Continue Nifedipine 90 mg daily  AKI (acute kidney injury) on CKD3b Cr elevated 2.40 yesterday (1.87 baseline), labs not yet returned from this AM.  I do suspect that his baseline may be higher than 1.87 and he was experiencing hyperfiltration of creatinine secondary to extremely elevated blood pressures.  He would benefit from a nephrology consult in the outpatient setting for kidney disease.  -Hold further doses lasix -Encourage PO -AM BMP - Monitor UOP   FEN/GI: Carb modified PPx: Aspirin/Plavix Dispo:Home  today or tomorrow . Barriers include continued BP control, kidney function  Subjective:  Patient states he is doing well this morning.  He is amenable to what ever plan is appropriate for his blood pressure control.  He  does have a PCP that he can follow-up with.  Objective: Temp:  [97.4 F (36.3 C)-98.2 F (36.8 C)] 97.4 F (36.3 C) (04/07 0335) Pulse Rate:  [53-78] 53 (04/07 0335) Resp:  [20-24] 23 (04/07 0335) BP: (151-204)/(77-103) 151/77 (04/07 0335) SpO2:  [93 %-98 %] 97 % (04/07 0335) Physical Exam: General: Alert and oriented, NAD, wearing CPAP Cardiovascular: RRR, no murmurs auscultated Respiratory: CTAB, normal WOB Neuro: Appropriately conversational, moves all extremities equally and appropriately Extremities: +1 pitting edema of BLEs  Laboratory: Most recent CBC Lab Results  Component Value Date   WBC 6.3 05/19/2022   HGB 13.1 05/19/2022   HCT 38.0 (L) 05/19/2022   MCV 88.8 05/19/2022   PLT 213 05/19/2022   Most recent BMP    Latest Ref Rng & Units 05/21/2022    4:09 AM  BMP  Glucose 70 - 99 mg/dL 89   BUN 6 - 20 mg/dL 34   Creatinine 4.81 - 1.24 mg/dL 8.56   Sodium 314 - 970 mmol/L 136   Potassium 3.5 - 5.1 mmol/L 3.7   Chloride 98 - 111 mmol/L 102   CO2 22 - 32 mmol/L 22   Calcium 8.9 - 10.3 mg/dL 9.1    Imaging/Diagnostic Tests: No recent imaging. Shelby Mattocks, DO 05/22/2022, 7:14 AM  PGY-2, [facility] FPTS Intern pager: [phone], text pages welcome Secure chat group [facility] Teaching Service

## 2022-05-22 NOTE — Discharge Instructions (Addendum)
Dear [name] [name],  Thank you for letting us participate in your care. You were hospitalized for a CVA (cerebral vascular accident).  You were treated with aspirin, Plavix, and blood pressure lowering medications.  You were discharged on labetalol 300 mg twice daily and nifedipine 90 mg daily.  You should continue taking both aspirin and Plavix for a total of 3 weeks.  You have 19 tablets of Plavix left.  After this, take aspirin only.   POST-HOSPITAL & CARE INSTRUCTIONS Schedule an appointment with the neurologist as soon as possible for a visit in 1 month Schedule an appointment with your PCP Dr. Cyndia Bent early in this coming week.  It is important that you follow-up with him for labs and to check your blood pressure Go to your follow up appointments (listed below) Stop taking Lasix and olmesartan Continue taking labetalol, nifedipine for blood pressure.  Continue aspirin and Plavix as stated above followed by aspirin only.   DOCTOR'S APPOINTMENT   No future appointments.  Follow-up Information     [facility]. Schedule an appointment as soon as possible for a visit in 1 month(s).   Specialty: Neurology Why: stroke clinic Contact information: [address] [place] [address] [phone]                Take care and be well!  Family Medicine Teaching Service Inpatient Team [place]  [facility]  [address] [place], [place] [address] [phone]

## 2022-05-22 NOTE — Progress Notes (Signed)
Phlebotomist was contacted for patient's labs per MD request. They will be here as soon as they can. Dr. Royal Piedra was made aware.

## 2022-05-22 NOTE — Significant Event (Signed)
Patient is discharging to home. Reviewed AVS with patient and his daughter. Patient verbalize understanding of his medications and follow up appointments in which he has to make the calls to set up the appointments. A copy of the AVS given to patient. All personal belongings taken home with patient. Taken to transportation by [employer].

## 2022-05-24 ENCOUNTER — Other Ambulatory Visit (HOSPITAL_BASED_OUTPATIENT_CLINIC_OR_DEPARTMENT_OTHER): Payer: Self-pay

## 2022-05-24 ENCOUNTER — Emergency Department (HOSPITAL_BASED_OUTPATIENT_CLINIC_OR_DEPARTMENT_OTHER)
Admission: EM | Admit: 2022-05-24 | Discharge: 2022-05-24 | Disposition: A | Discharge status: Home / Self Care | Payer: BC Managed Care – PPO | Attending: Emergency Medicine | Admitting: Emergency Medicine

## 2022-05-24 ENCOUNTER — Other Ambulatory Visit: Payer: Self-pay

## 2022-05-24 DIAGNOSIS — Z7902 Long term (current) use of antithrombotics/antiplatelets: Secondary | ICD-10-CM | POA: Insufficient documentation

## 2022-05-24 DIAGNOSIS — Z7982 Long term (current) use of aspirin: Secondary | ICD-10-CM | POA: Insufficient documentation

## 2022-05-24 DIAGNOSIS — N189 Chronic kidney disease, unspecified: Secondary | ICD-10-CM | POA: Insufficient documentation

## 2022-05-24 DIAGNOSIS — I160 Hypertensive urgency: Secondary | ICD-10-CM | POA: Diagnosis present

## 2022-05-24 DIAGNOSIS — I129 Hypertensive chronic kidney disease with stage 1 through stage 4 chronic kidney disease, or unspecified chronic kidney disease: Secondary | ICD-10-CM | POA: Insufficient documentation

## 2022-05-24 LAB — BASIC METABOLIC PANEL
Anion gap: 9 (ref 5–15)
BUN: 30 mg/dL — ABNORMAL HIGH (ref 6–20)
CO2: 24 mmol/L (ref 22–32)
Calcium: 9.9 mg/dL (ref 8.9–10.3)
Chloride: 104 mmol/L (ref 98–111)
Creatinine, Ser: 2.06 mg/dL — ABNORMAL HIGH (ref 0.61–1.24)
GFR, Estimated: 37 mL/min — ABNORMAL LOW (ref >60)
Glucose, Bld: 119 mg/dL — ABNORMAL HIGH (ref 70–99)
Potassium: 4.4 mmol/L (ref 3.5–5.1)
Sodium: 137 mmol/L (ref 135–145)

## 2022-05-24 LAB — CBC
HCT: 40.6 % (ref 39.0–52.0)
Hemoglobin: 13.5 g/dL (ref 13.0–17.0)
MCH: 29.9 pg (ref 26.0–34.0)
MCHC: 33.3 g/dL (ref 30.0–36.0)
MCV: 90 fL (ref 80.0–100.0)
Platelets: 224 10*3/uL (ref 150–400)
RBC: 4.51 MIL/uL (ref 4.22–5.81)
RDW: 14 % (ref 11.5–15.5)
WBC: 6 10*3/uL (ref 4.0–10.5)
nRBC: 0 % (ref 0.0–0.2)

## 2022-05-24 LAB — GLUCOSE, CAPILLARY
Glucose-Capillary: 103 mg/dL — ABNORMAL HIGH (ref 70–99)
Glucose-Capillary: 87 mg/dL (ref 70–99)

## 2022-05-24 MED ORDER — CLONIDINE HCL 0.1 MG PO TABS
0.2000 mg | ORAL_TABLET | Freq: Once | ORAL | Status: AC
  Administered 2022-05-24: 0.2 mg via ORAL
  Filled 2022-05-24: qty 2

## 2022-05-24 MED ORDER — CLONIDINE HCL 0.1 MG PO TABS
0.1000 mg | ORAL_TABLET | Freq: Two times a day (BID) | ORAL | 0 refills | Status: AC
  Filled 2022-05-24: qty 7, 4d supply, fill #0
  Filled 2022-05-24: qty 23, 11d supply, fill #0

## 2022-05-24 NOTE — ED Triage Notes (Signed)
Patient here POV from Home.  Endorses being at PCP Office for Routine Assessment and Follow up regarding recent Hospital Admission for CVA.   No Symptoms. BP was 190/100.   CVA Tuesday. Began taking Procardia, Plavix, and Normodyne. Has not taken Normodyne today.   NAD Noted during Triage. A&Ox4. GCS 15. Ambulatory.

## 2022-05-24 NOTE — Discharge Instructions (Addendum)
Please call Dr. Duke Salvia at the clinic for ideal management of your blood pressure. Take the BP medications as prescribed. Make sure you keep a log of blood pressure daily and take that information with you to Dr. Duke Salvia when you see her in the clinic.

## 2022-05-24 NOTE — ED Notes (Signed)
Pt reports high blood pressure. No other complaints at this time. Pt alert/oriented. Denies warm blanket. Call light within reach. Pt resting.

## 2022-05-24 NOTE — ED Provider Notes (Signed)
[place] EMERGENCY DEPARTMENT AT [facility] Provider Note   CSN: [id number] Arrival date & time: 05/24/22  1158     History  Chief Complaint  Patient presents with   Hypertension    [name] [name] is a 57 y.o. male.  HPI    Patient comes in with chief complaint of elevated blood pressure. Patient advised to come to the emergency room by PCP because of elevated blood pressure.  Patient had a recent history of stroke.  He has history of CKD.  He states that while he was admitted to the hospital, his antihypertensives were changed because of his renal function.  He went to his PCP for follow-up, they noted that his blood pressure was over 200 and he was advised to come to the ER.  Patient has been taking his medications as prescribed.  He denies any associated chest pain, shortness of breath, palpitations, new neurologic deficits, severe headaches.  Home Medications Prior to Admission medications   Medication Sig Start Date End Date Taking? Authorizing Provider  cloNIDine (CATAPRES) 0.1 MG tablet Take 1 tablet (0.1 mg total) by mouth 2 (two) times daily. 05/24/22  Yes Derwood Kaplan, MD  aspirin EC 81 MG tablet Take 1 tablet (81 mg total) by mouth daily. Swallow whole. 05/22/22   Erick Alley, DO  blood glucose meter kit and supplies KIT Dispense based on patient and insurance preference. Use up to four times daily as directed. 05/06/20   Sponseller, Eugene Gavia, PA-C  cholecalciferol (VITAMIN D3) 25 MCG (1000 UNIT) tablet Take 1 tablet (1,000 Units total) by mouth daily. 05/22/22   Erick Alley, DO  clopidogrel (PLAVIX) 75 MG tablet Take 1 tablet (75 mg total) by mouth daily. 05/22/22   Erick Alley, DO  glucose blood test strip Use as instructed 05/06/20   Sponseller, Lupe Carney R, PA-C  labetalol (NORMODYNE) 300 MG tablet Take 1 tablet (300 mg total) by mouth 2 (two) times daily. 05/22/22   Erick Alley, DO  NIFEdipine (PROCARDIA XL/NIFEDICAL-XL) 90 MG 24 hr tablet Take 1 tablet (90 mg total)  by mouth daily. 05/23/22   Erick Alley, DO  rosuvastatin (CRESTOR) 40 MG tablet Take 1 tablet (40 mg total) by mouth at bedtime. 05/22/22   Erick Alley, DO      Allergies    Hydralazine, Amlodipine, and Prednisone    Review of Systems   Review of Systems  All other systems reviewed and are negative.   Physical Exam Updated Vital Signs BP (!) 203/100   Pulse (!) 50   Temp 97.7 F (36.5 C)   Resp 14   Ht 6' (1.829 m)   Wt (!) 138.2 kg   SpO2 96%   BMI 41.32 kg/m  Physical Exam Vitals and nursing note reviewed.  Constitutional:      Appearance: He is well-developed.  HENT:     Head: Atraumatic.  Eyes:     Extraocular Movements: Extraocular movements intact.     Pupils: Pupils are equal, round, and reactive to light.  Cardiovascular:     Rate and Rhythm: Normal rate.  Pulmonary:     Effort: Pulmonary effort is normal.  Musculoskeletal:     Cervical back: Neck supple.  Skin:    General: Skin is warm.  Neurological:     Mental Status: He is alert and oriented to person, place, and time.     ED Results / Procedures / Treatments   Labs (all labs ordered are listed, but only abnormal results are displayed) Labs  Reviewed  BASIC METABOLIC PANEL - Abnormal; Notable for the following components:      Result Value   Glucose, Bld 119 (*)    BUN 30 (*)    Creatinine, Ser 2.06 (*)    GFR, Estimated 37 (*)    All other components within normal limits  CBC    EKG EKG Interpretation  Date/Time:  Tuesday May 24 2022 12:15:06 EDT Ventricular Rate:  64 PR Interval:  188 QRS Duration: 158 QT Interval:  436 QTC Calculation: 449 R Axis:   103 Text Interpretation: Normal sinus rhythm Possible Left atrial enlargement Right bundle branch block Anterior infarct , age undetermined Abnormal ECG TWI in the lateral leads No significant change since last tracing Confirmed by Derwood KaplanNanavati, Calisha Tindel [phone]([id number]) on 05/24/2022 1:27:03 PM  Radiology No results found.  Procedures Procedures     Medications Ordered in ED Medications  cloNIDine (CATAPRES) tablet 0.2 mg (0.2 mg Oral Given 05/24/22 1423)    ED Course/ Medical Decision Making/ A&P                             Medical Decision Making Amount and/or Complexity of Data Reviewed Labs: ordered.  Risk Prescription drug management.   This patient presents to the ED with chief complaint(s) of elevated hypertension, asymptomatic with pertinent past medical history of recent stroke, CKD, CHF, hypertension with recent admission to the hospital for stroke where his antihypertensives were changed..The complaint involves an extensive differential diagnosis and also carries with it a high risk of complications and morbidity.    The differential diagnosis includes : Hypertensive urgency, hypertensive emergency, uncontrolled hypertension.  The initial plan is to discuss the case with PCP.  Basic labs will be ordered to ensure that his renal function is not worsening. Patient is asymptomatic with this blood pressure.  No cardiac workup, CT scan of the brain indicated.   Additional history obtained: Records reviewed previous admission documents and Care Everywhere/External Records  Independent labs interpretation:  The following labs were independently interpreted: CBC and BMP are reassuring.  Consultation: - Consulted or discussed management/test interpretation with external professional: I consulted with patient's PCP.  I spoke with the primary care physician who indicated that patient's BP was high, in the setting of recent stroke, CKD they wanted patient to be seen by cardiologist.  However the cardiologist at outside hospital was not comfortable seeing the patient without any ED workup, recommended ER follow-up.  I discussed with the PCP that there is no clear indication for admission.  Patient's labs are reassuring.  And that we will start patient on clonidine 0.2 mg twice daily as needed.  Patient already has an  appointment with cardiologist next week.  That cardiologist is with outside hospital system.  Indicates that follow-up is not completed due to unknown circumstances, we have given him follow-up information for our cardiology service as well.  Patient made aware that we will start him on clonidine 0.2 mg.  We have counseled him on appropriate management for hypertension, endorgan symptoms that should prompt return to the ER.  He will come back if he has chest pain, new neurologic deficits, severe headache.  Patient will try to follow-up with his cardiologist next week, he will keep blood pressure log.  Final Clinical Impression(s) / ED Diagnoses Final diagnoses:  Hypertensive urgency    Rx / DC Orders ED Discharge Orders          Ordered  cloNIDine (CATAPRES) 0.1 MG tablet  2 times daily        05/24/22 1517              Derwood KaplanNanavati, Janari Yamada, MD 05/30/22 1510

## 2022-06-06 ENCOUNTER — Other Ambulatory Visit (HOSPITAL_BASED_OUTPATIENT_CLINIC_OR_DEPARTMENT_OTHER): Payer: Self-pay

## 2022-06-07 ENCOUNTER — Other Ambulatory Visit: Payer: Self-pay | Admitting: Student

## 2022-06-12 ENCOUNTER — Other Ambulatory Visit: Payer: Self-pay

## 2022-06-12 ENCOUNTER — Encounter (HOSPITAL_COMMUNITY): Payer: Self-pay

## 2022-06-12 ENCOUNTER — Emergency Department (HOSPITAL_COMMUNITY): Payer: BC Managed Care – PPO

## 2022-06-12 ENCOUNTER — Emergency Department (HOSPITAL_COMMUNITY)
Admission: EM | Admit: 2022-06-12 | Discharge: 2022-06-12 | Disposition: A | Discharge status: Home / Self Care | Payer: BC Managed Care – PPO | Attending: Emergency Medicine | Admitting: Emergency Medicine

## 2022-06-12 DIAGNOSIS — I10 Essential (primary) hypertension: Secondary | ICD-10-CM | POA: Diagnosis not present

## 2022-06-12 DIAGNOSIS — Z7902 Long term (current) use of antithrombotics/antiplatelets: Secondary | ICD-10-CM | POA: Insufficient documentation

## 2022-06-12 DIAGNOSIS — Z7982 Long term (current) use of aspirin: Secondary | ICD-10-CM | POA: Insufficient documentation

## 2022-06-12 DIAGNOSIS — R42 Dizziness and giddiness: Secondary | ICD-10-CM | POA: Diagnosis present

## 2022-06-12 DIAGNOSIS — Z79899 Other long term (current) drug therapy: Secondary | ICD-10-CM | POA: Diagnosis not present

## 2022-06-12 DIAGNOSIS — R791 Abnormal coagulation profile: Secondary | ICD-10-CM | POA: Insufficient documentation

## 2022-06-12 LAB — URINALYSIS, ROUTINE W REFLEX MICROSCOPIC
Bacteria, UA: NONE SEEN
Bilirubin Urine: NEGATIVE
Glucose, UA: NEGATIVE mg/dL
Ketones, ur: NEGATIVE mg/dL
Leukocytes,Ua: NEGATIVE
Nitrite: NEGATIVE
Protein, ur: 100 mg/dL — AB
Specific Gravity, Urine: 1.01 (ref 1.005–1.030)
pH: 6 (ref 5.0–8.0)

## 2022-06-12 LAB — DIFFERENTIAL
Abs Immature Granulocytes: 0.01 10*3/uL (ref 0.00–0.07)
Basophils Absolute: 0 10*3/uL (ref 0.0–0.1)
Basophils Relative: 1 %
Eosinophils Absolute: 0.3 10*3/uL (ref 0.0–0.5)
Eosinophils Relative: 4 %
Immature Granulocytes: 0 %
Lymphocytes Relative: 13 %
Lymphs Abs: 0.8 10*3/uL (ref 0.7–4.0)
Monocytes Absolute: 0.5 10*3/uL (ref 0.1–1.0)
Monocytes Relative: 7 %
Neutro Abs: 4.8 10*3/uL (ref 1.7–7.7)
Neutrophils Relative %: 75 %

## 2022-06-12 LAB — CBC
HCT: 40.8 % (ref 39.0–52.0)
Hemoglobin: 13.5 g/dL (ref 13.0–17.0)
MCH: 30.1 pg (ref 26.0–34.0)
MCHC: 33.1 g/dL (ref 30.0–36.0)
MCV: 91.1 fL (ref 80.0–100.0)
Platelets: 212 10*3/uL (ref 150–400)
RBC: 4.48 MIL/uL (ref 4.22–5.81)
RDW: 13.8 % (ref 11.5–15.5)
WBC: 6.4 10*3/uL (ref 4.0–10.5)
nRBC: 0 % (ref 0.0–0.2)

## 2022-06-12 LAB — RAPID URINE DRUG SCREEN, HOSP PERFORMED
Amphetamines: NOT DETECTED
Barbiturates: NOT DETECTED
Benzodiazepines: NOT DETECTED
Cocaine: NOT DETECTED
Opiates: NOT DETECTED
Tetrahydrocannabinol: NOT DETECTED

## 2022-06-12 LAB — COMPREHENSIVE METABOLIC PANEL
ALT: 25 U/L (ref 0–44)
AST: 29 U/L (ref 15–41)
Albumin: 3.7 g/dL (ref 3.5–5.0)
Alkaline Phosphatase: 66 U/L (ref 38–126)
Anion gap: 11 (ref 5–15)
BUN: 25 mg/dL — ABNORMAL HIGH (ref 6–20)
CO2: 24 mmol/L (ref 22–32)
Calcium: 9.5 mg/dL (ref 8.9–10.3)
Chloride: 103 mmol/L (ref 98–111)
Creatinine, Ser: 2.11 mg/dL — ABNORMAL HIGH (ref 0.61–1.24)
GFR, Estimated: 36 mL/min — ABNORMAL LOW (ref >60)
Glucose, Bld: 155 mg/dL — ABNORMAL HIGH (ref 70–99)
Potassium: 4.4 mmol/L (ref 3.5–5.1)
Sodium: 138 mmol/L (ref 135–145)
Total Bilirubin: 0.9 mg/dL (ref 0.3–1.2)
Total Protein: 7.2 g/dL (ref 6.5–8.1)

## 2022-06-12 LAB — APTT: aPTT: 33 seconds (ref 24–36)

## 2022-06-12 LAB — I-STAT CHEM 8, ED
BUN: 29 mg/dL — ABNORMAL HIGH (ref 6–20)
Calcium, Ion: 1.16 mmol/L (ref 1.15–1.40)
Chloride: 105 mmol/L (ref 98–111)
Creatinine, Ser: 2.2 mg/dL — ABNORMAL HIGH (ref 0.61–1.24)
Glucose, Bld: 153 mg/dL — ABNORMAL HIGH (ref 70–99)
HCT: 42 % (ref 39.0–52.0)
Hemoglobin: 14.3 g/dL (ref 13.0–17.0)
Potassium: 4.4 mmol/L (ref 3.5–5.1)
Sodium: 140 mmol/L (ref 135–145)
TCO2: 25 mmol/L (ref 22–32)

## 2022-06-12 LAB — PROTIME-INR
INR: 1 (ref 0.8–1.2)
Prothrombin Time: 13.5 seconds (ref 11.4–15.2)

## 2022-06-12 LAB — ETHANOL: Alcohol, Ethyl (B): <10 mg/dL (ref <10)

## 2022-06-12 MED ORDER — DIAZEPAM 5 MG PO TABS
5.0000 mg | ORAL_TABLET | Freq: Once | ORAL | Status: AC
  Administered 2022-06-12: 5 mg via ORAL
  Filled 2022-06-12: qty 1

## 2022-06-12 MED ORDER — MECLIZINE HCL 25 MG PO TABS
25.0000 mg | ORAL_TABLET | Freq: Once | ORAL | Status: AC
  Administered 2022-06-12: 25 mg via ORAL
  Filled 2022-06-12: qty 1

## 2022-06-12 MED ORDER — DIAZEPAM 5 MG PO TABS: 5.0000 mg | ORAL_TABLET | Freq: Every day | ORAL | 0 refills | Status: AC

## 2022-06-12 NOTE — ED Notes (Signed)
Patient transported to MRI 

## 2022-06-12 NOTE — ED Triage Notes (Signed)
Pt arrived here POV from home brought in by daughter with a c/o dizziness. Unstable when he walks. Noticed it when he woke up at 7am to go to the bathroom. Last known well was 0030 today. No CP or SOB.

## 2022-06-12 NOTE — Discharge Instructions (Addendum)
You were seen in the ER today for evaluation of your dizziness and lightheadedness.  I am glad that you are feeling better.  I am sending you home with a few days of the Valium that you received helped with your symptoms.  Please make sure you call neurology tomorrow to see if they can schedule you with an earlier appointment.  If you have any headache, visual changes, trouble walking, trouble talking, new or worsening symptoms, please return to the nearest emergency room for evaluation.  Contact a doctor if: Your medicine does not help your vertigo. Your problems get worse or you have new symptoms. You have a fever. You feel like you may vomit (nauseous), or this feeling gets worse. You start to vomit. Your family or friends see changes in how you act. You lose feeling (have numbness) in part of your body. You feel prickling and tingling in a part of your body. Get help right away if: You are always dizzy. You faint. You get very bad headaches. You get a stiff neck. Bright light starts to bother you. You have trouble moving or talking. You feel weak in your hands, arms, or legs. You have changes in your hearing or in how you see (vision). These symptoms may be an emergency. Get help right away. Call your local emergency services (911 in the [place] Do not wait to see if the symptoms will go away. Do not drive yourself to the hospital.

## 2022-06-12 NOTE — ED Provider Notes (Signed)
[place] EMERGENCY DEPARTMENT AT [facility] Provider Note   CSN: [id number] Arrival date & time: 06/12/22  4098     History Chief Complaint  Patient presents with   Dizziness    [name] [name] is a 57 y.o. male with history of hyperlipidemia and hypertension presents the emergency room today for evaluation of room spinning sensation.  Patient reports that this started at 7:00 when he woke up and walk to the bathroom.  He reports that sometimes happens whenever he sitting down but mainly whenever he is trying to stand up or walk around.  Last known well was 0030 today.  Patient recently had a CVA and was diagnosed on 05-18-2022.  He reports this feels similar in presentation.  He reports he took his labetalol, nifedipine, and aspirin this morning.  He denies any chest pain or any shortness of breath.  Denies any blurry vision or visual changes.  Denies any weakness, numbness, or tingling.  Denies any fevers or recent cough or cold symptoms.   Dizziness Associated symptoms: no chest pain, no nausea, no shortness of breath, no vomiting and no weakness        Home Medications Prior to Admission medications   Medication Sig Start Date End Date Taking? Authorizing Provider  aspirin EC 81 MG tablet Take 1 tablet (81 mg total) by mouth daily. Swallow whole. 05/22/22   Erick Alley, DO  blood glucose meter kit and supplies KIT Dispense based on patient and insurance preference. Use up to four times daily as directed. 05/06/20   Sponseller, Eugene Gavia, PA-C  cholecalciferol (VITAMIN D3) 25 MCG (1000 UNIT) tablet Take 1 tablet (1,000 Units total) by mouth daily. 05/22/22   Erick Alley, DO  cloNIDine (CATAPRES) 0.1 MG tablet Take 1 tablet (0.1 mg total) by mouth 2 (two) times daily. 05/24/22   Derwood Kaplan, MD  clopidogrel (PLAVIX) 75 MG tablet Take 1 tablet (75 mg total) by mouth daily. 05/22/22   Erick Alley, DO  glucose blood test strip Use as instructed 05/06/20   Sponseller, Lupe Carney R, PA-C   labetalol (NORMODYNE) 300 MG tablet Take 1 tablet (300 mg total) by mouth 2 (two) times daily. 05/22/22   Erick Alley, DO  NIFEdipine (PROCARDIA XL/NIFEDICAL-XL) 90 MG 24 hr tablet Take 1 tablet (90 mg total) by mouth daily. 05/23/22   Erick Alley, DO  rosuvastatin (CRESTOR) 40 MG tablet Take 1 tablet (40 mg total) by mouth at bedtime. 05/22/22   Erick Alley, DO      Allergies    Hydralazine, Amlodipine, and Prednisone    Review of Systems   Review of Systems  Constitutional:  Negative for chills and fever.  Eyes:  Negative for photophobia and visual disturbance.  Respiratory:  Negative for shortness of breath.   Cardiovascular:  Negative for chest pain.  Gastrointestinal:  Negative for abdominal pain, nausea and vomiting.  Musculoskeletal:  Positive for gait problem.  Neurological:  Positive for dizziness. Negative for syncope and weakness.    Physical Exam Updated Vital Signs BP (!) 198/98 (BP Location: Right Arm)   Pulse 70   Temp 98 F (36.7 C) (Oral)   Resp 16   Ht 6' (1.829 m)   Wt (!) 138.2 kg   SpO2 96%   BMI 41.32 kg/m  Physical Exam Vitals and nursing note reviewed.  Constitutional:      General: He is not in acute distress.    Appearance: Normal appearance. He is not toxic-appearing.  HENT:  Head: Normocephalic and atraumatic.     Right Ear: Tympanic membrane, ear canal and external ear normal.     Left Ear: Tympanic membrane, ear canal and external ear normal.     Nose: Nose normal.     Mouth/Throat:     Mouth: Mucous membranes are moist.  Eyes:     General: No visual field deficit or scleral icterus.    Extraocular Movements: Extraocular movements intact.     Pupils: Pupils are equal, round, and reactive to light.  Cardiovascular:     Rate and Rhythm: Normal rate and regular rhythm.  Pulmonary:     Effort: Pulmonary effort is normal. No respiratory distress.     Breath sounds: Normal breath sounds.  Musculoskeletal:        General: No deformity.      Cervical back: Normal range of motion. No rigidity or tenderness.     Right lower leg: Edema present.     Left lower leg: Edema present.     Comments: Trace bilateral lower extremity edema.  Skin:    General: Skin is warm and dry.  Neurological:     General: No focal deficit present.     Mental Status: He is alert.     GCS: GCS eye subscore is 4. GCS verbal subscore is 5. GCS motor subscore is 6.     Cranial Nerves: No cranial nerve deficit, dysarthria or facial asymmetry.     Sensory: No sensory deficit.     Motor: No pronator drift.     Coordination: Coordination normal. Finger-Nose-Finger Test and Heel to [facility] Test normal.     Comments: GCS 15.  Patient alert.  Cranial nerves II through XII intact.  No facial asymmetry noted.  Patient is answering questions appropriately appropriate speech.  No visual field deficit appreciated.  Patient reportedly sensations intact throughout.  No pronator drift.  Patient does have some slight right upper extremity weakness but reports is from his gout and is at his chronic baseline.  No other weakness appreciated.  Normal coordination.  Normal finger-nose-finger bilaterally.  Normal heel-to-shin bilaterally.     ED Results / Procedures / Treatments   Labs (all labs ordered are listed, but only abnormal results are displayed) Labs Reviewed  ETHANOL  PROTIME-INR  APTT  CBC  DIFFERENTIAL  COMPREHENSIVE METABOLIC PANEL  RAPID URINE DRUG SCREEN, HOSP PERFORMED  URINALYSIS, ROUTINE W REFLEX MICROSCOPIC  I-STAT CHEM 8, ED    EKG EKG Interpretation  Date/Time:  Sunday June 12 2022 09:34:39 EDT Ventricular Rate:  63 PR Interval:  200 QRS Duration: 176 QT Interval:  461 QTC Calculation: 472 R Axis:   89 Text Interpretation: Sinus rhythm Right bundle branch block since last tracing no significant change Confirmed by Eber Hong ([id number]) on 06/12/2022 9:35:40 AM  Radiology No results found.  Procedures Procedures   Medications Ordered in  ED Medications - No data to display  ED Course/ Medical Decision Making/ A&P Clinical Course as of 06/12/22 1324  Sun Jun 12, 2022  6045 Spoke with Dr. Randal Buba who recommended MRI of the brain without contrast. Asked to be called back when ever MRI results.  [RR]  1056 Re-consultation for neurology placed [RR]  1131 Inform patient of results.  Patient just received meclizine.  Will reevaluate after. [RR]    Clinical Course User Index [RR] Achille Rich, PA-C   {  Medical Decision Making Amount and/or Complexity of Data Reviewed Labs: ordered. Radiology: ordered.  Risk Prescription drug management.   57 y.o. male presents to the ER for evaluation of dizziness. Differential diagnosis includes but is not limited to BPPV, vestibular migraine, head trauma, AVM, intracranial tumor, multiple sclerosis, drug-related, CVA, vasovagal syncope, orthostatic hypotension, sepsis, hypoglycemia, electrolyte disturbance, anemia, anxiety/panic attack. Vital signs show hypertension at 198/98 otherwise afebrile, no pulse rate, satting well room air without increased work of breathing. Physical exam as noted above.   Patient out of stroke window given last known normal was at 0030 today.  Does not meet any LVO criteria.  Consulted neuro placed.  CT head ordered.  Stroke labs ordered.  Given out of window, no code stroke necessary.  Consult received back from Dr. Randal Buba.  Recommended MRI of the brain and reconsultation after results.  Will allow permissive hypertension until imaging results.  Reports that he took his labetalol and phentermine and aspirin this morning.  I independently reviewed and interpreted the patient's labs.  CBC without leukocytosis or anemia.  Urinalysis shows straw-colored urine with small and hemoglobin 100 protein otherwise no signs of infection.  Normal APTT and PT/INR.  Ethanol negative.  CMP shows of a glucose of 155.  Creatinine at 2.11 with BUN  at 25.  This appears to be around his baseline.  UDS negative.  MRI brain shows chronic lacunar infarct at the left basal ganglia.  I reconsulted neurology and spoke with Dr. Randal Buba again. He reviewed the imaging and reports that this is likely recrudescence his previous stroke.  Asked about meclizine which he is not supportive.  Recommended follow-up with neurology. Meclizine ordered.  He is already had a full stroke workup including echo.  He had a CTA of his head and neck earlier this month.  On reevaluation, patient reports he still feeling the dizziness sensation whenever he turns his head.  Will order Valium.  Patient experienced some relief of symptoms with Valium.  He is ambulatory with no assistance at bedside.  Likely recrudescence of his stroke.  Patient's been increasingly stressed lately.  Stated for history of the year death of his brother.  He also reports that his brother's wife recently lost her job and has been feeling stressed about that as well.  He now feels anxious if something happens to him as he feels like he is having another stroke.  He denies any recent illnesses.  Denies any chest pain or shortness of breath.  He does not have any nuchal rigidity, I doubt any meningitis.  His TMs are clear, doubt any otitis media or otitis externa causing this.  Encouraged him to follow-up with neurology to see if they have a sooner appointment as his appointment is later in May. We discussed the results of the labs/imaging. The plan is follow-up with neurology.  Take Valium as prescribed.. We discussed strict return precautions and red flag symptoms. The patient verbalized their understanding and agrees to the plan. The patient is stable and being discharged home in good condition.  Portions of this report may have been transcribed using voice recognition software. Every effort was made to ensure accuracy; however, inadvertent computerized transcription errors may be present.   I  discussed this case with my attending physician who cosigned this note including patient's presenting symptoms, physical exam, and planned diagnostics and interventions. Attending physician stated agreement with plan or made changes to plan which were implemented.   Attending physician assessed patient at bedside.  Final Clinical Impression(s) / ED Diagnoses Final diagnoses:  Vertigo     Rx / DC Orders ED Discharge Orders          Ordered    diazepam (VALIUM) 5 MG tablet  Daily        06/12/22 1409              Achille Rich, PA-C 06/12/22 1615    Eber Hong, MD 06/14/22 [phone]

## 2022-06-18 ENCOUNTER — Other Ambulatory Visit: Payer: Self-pay | Admitting: Student

## 2022-07-28 ENCOUNTER — Other Ambulatory Visit: Payer: Self-pay

## 2022-07-28 ENCOUNTER — Emergency Department (HOSPITAL_COMMUNITY): Payer: BC Managed Care – PPO

## 2022-07-28 ENCOUNTER — Encounter (HOSPITAL_COMMUNITY): Payer: Self-pay

## 2022-07-28 ENCOUNTER — Emergency Department (HOSPITAL_COMMUNITY)
Admission: EM | Admit: 2022-07-28 | Discharge: 2022-07-28 | Disposition: A | Discharge status: Home / Self Care | Payer: BC Managed Care – PPO | Attending: Emergency Medicine | Admitting: Emergency Medicine

## 2022-07-28 DIAGNOSIS — Z7982 Long term (current) use of aspirin: Secondary | ICD-10-CM | POA: Diagnosis not present

## 2022-07-28 DIAGNOSIS — M25511 Pain in right shoulder: Secondary | ICD-10-CM | POA: Insufficient documentation

## 2022-07-28 DIAGNOSIS — I10 Essential (primary) hypertension: Secondary | ICD-10-CM | POA: Insufficient documentation

## 2022-07-28 DIAGNOSIS — Z79899 Other long term (current) drug therapy: Secondary | ICD-10-CM | POA: Insufficient documentation

## 2022-07-28 DIAGNOSIS — R1010 Upper abdominal pain, unspecified: Secondary | ICD-10-CM | POA: Insufficient documentation

## 2022-07-28 DIAGNOSIS — R0789 Other chest pain: Secondary | ICD-10-CM | POA: Diagnosis present

## 2022-07-28 LAB — COMPREHENSIVE METABOLIC PANEL
ALT: 17 U/L (ref 0–44)
AST: 17 U/L (ref 15–41)
Albumin: 3.3 g/dL — ABNORMAL LOW (ref 3.5–5.0)
Alkaline Phosphatase: 50 U/L (ref 38–126)
Anion gap: 8 (ref 5–15)
BUN: 32 mg/dL — ABNORMAL HIGH (ref 6–20)
CO2: 26 mmol/L (ref 22–32)
Calcium: 9.2 mg/dL (ref 8.9–10.3)
Chloride: 103 mmol/L (ref 98–111)
Creatinine, Ser: 2.01 mg/dL — ABNORMAL HIGH (ref 0.61–1.24)
GFR, Estimated: 38 mL/min — ABNORMAL LOW (ref >60)
Glucose, Bld: 132 mg/dL — ABNORMAL HIGH (ref 70–99)
Potassium: 4.4 mmol/L (ref 3.5–5.1)
Sodium: 137 mmol/L (ref 135–145)
Total Bilirubin: 0.8 mg/dL (ref 0.3–1.2)
Total Protein: 7.2 g/dL (ref 6.5–8.1)

## 2022-07-28 LAB — CBC WITH DIFFERENTIAL/PLATELET
Abs Immature Granulocytes: 0.02 10*3/uL (ref 0.00–0.07)
Basophils Absolute: 0 10*3/uL (ref 0.0–0.1)
Basophils Relative: 1 %
Eosinophils Absolute: 0.2 10*3/uL (ref 0.0–0.5)
Eosinophils Relative: 2 %
HCT: 39.5 % (ref 39.0–52.0)
Hemoglobin: 12.9 g/dL — ABNORMAL LOW (ref 13.0–17.0)
Immature Granulocytes: 0 %
Lymphocytes Relative: 10 %
Lymphs Abs: 0.8 10*3/uL (ref 0.7–4.0)
MCH: 30 pg (ref 26.0–34.0)
MCHC: 32.7 g/dL (ref 30.0–36.0)
MCV: 91.9 fL (ref 80.0–100.0)
Monocytes Absolute: 0.7 10*3/uL (ref 0.1–1.0)
Monocytes Relative: 9 %
Neutro Abs: 6.3 10*3/uL (ref 1.7–7.7)
Neutrophils Relative %: 78 %
Platelets: 240 10*3/uL (ref 150–400)
RBC: 4.3 MIL/uL (ref 4.22–5.81)
RDW: 14 % (ref 11.5–15.5)
WBC: 8 10*3/uL (ref 4.0–10.5)
nRBC: 0 % (ref 0.0–0.2)

## 2022-07-28 LAB — TROPONIN I (HIGH SENSITIVITY)
Troponin I (High Sensitivity): 14 ng/L (ref <18)
Troponin I (High Sensitivity): 13 ng/L (ref <18)

## 2022-07-28 LAB — BRAIN NATRIURETIC PEPTIDE: B Natriuretic Peptide: 38.4 pg/mL (ref 0.0–100.0)

## 2022-07-28 LAB — LIPASE, BLOOD: Lipase: 34 U/L (ref 11–51)

## 2022-07-28 MED ORDER — IOHEXOL 350 MG/ML SOLN
75.0000 mL | Freq: Once | INTRAVENOUS | Status: AC | PRN
  Administered 2022-07-28: 75 mL via INTRAVENOUS

## 2022-07-28 MED ORDER — HYDROCODONE-ACETAMINOPHEN 5-325 MG PO TABS: 1.0000 | ORAL_TABLET | Freq: Four times a day (QID) | ORAL | 0 refills | Status: DC | PRN

## 2022-07-28 MED ORDER — HYDROMORPHONE HCL 1 MG/ML IJ SOLN: 1.0000 mg | Freq: Once | INTRAMUSCULAR | Status: DC

## 2022-07-28 MED ORDER — ALUM & MAG HYDROXIDE-SIMETH 200-200-20 MG/5ML PO SUSP
30.0000 mL | Freq: Once | ORAL | Status: AC
  Administered 2022-07-28: 30 mL via ORAL
  Filled 2022-07-28: qty 30

## 2022-07-28 MED ORDER — KETOROLAC TROMETHAMINE 15 MG/ML IJ SOLN: 15.0000 mg | Freq: Once | INTRAMUSCULAR | Status: DC

## 2022-07-28 MED ORDER — TIZANIDINE HCL 4 MG PO TABS: 4.0000 mg | ORAL_TABLET | Freq: Four times a day (QID) | ORAL | 0 refills | Status: DC | PRN

## 2022-07-28 MED ORDER — PANTOPRAZOLE SODIUM 20 MG PO TBEC: 20.0000 mg | DELAYED_RELEASE_TABLET | Freq: Every day | ORAL | 0 refills | Status: DC

## 2022-07-28 MED ORDER — SODIUM CHLORIDE 0.9 % IV SOLN: Freq: Once | INTRAVENOUS | Status: AC

## 2022-07-28 MED ORDER — FAMOTIDINE 20 MG PO TABS
40.0000 mg | ORAL_TABLET | Freq: Once | ORAL | Status: AC
  Administered 2022-07-28: 40 mg via ORAL
  Filled 2022-07-28: qty 2

## 2022-07-28 MED ORDER — DIAZEPAM 5 MG PO TABS
5.0000 mg | ORAL_TABLET | Freq: Once | ORAL | Status: AC
  Administered 2022-07-28: 5 mg via ORAL
  Filled 2022-07-28: qty 1

## 2022-07-28 MED ORDER — PANTOPRAZOLE SODIUM 40 MG IV SOLR
40.0000 mg | Freq: Once | INTRAVENOUS | Status: DC
  Filled 2022-07-28: qty 10

## 2022-07-28 MED ORDER — DICLOFENAC SODIUM 1 % EX GEL
2.0000 g | Freq: Once | CUTANEOUS | Status: AC
  Administered 2022-07-28: 2 g via TOPICAL
  Filled 2022-07-28 (×2): qty 100

## 2022-07-28 MED ORDER — MORPHINE SULFATE (PF) 4 MG/ML IV SOLN
4.0000 mg | Freq: Once | INTRAVENOUS | Status: AC
  Administered 2022-07-28: 4 mg via INTRAVENOUS
  Filled 2022-07-28: qty 1

## 2022-07-28 MED ORDER — HYDROCODONE-ACETAMINOPHEN 5-325 MG PO TABS
2.0000 | ORAL_TABLET | Freq: Once | ORAL | Status: AC
  Administered 2022-07-28: 2 via ORAL
  Filled 2022-07-28: qty 2

## 2022-07-28 NOTE — ED Provider Notes (Signed)
I assumed care from Dr. Clarice Pole patient here with complaints of bilateral shoulder and chest pain.  CTA was negative for acute findings other than degenerative changes of the spine.  Patient is still complaining of upper extremity pain mostly in the right shoulder.  Suspect this is musculoskeletal in nature.  All findings discussed with the patient.  He is still having pain we will try Valium and Voltaren gel. At this time feel that patient is stable for discharge home but did discuss following up with his PCP if symptoms persist may need physical therapy, dry needling etc.   Gwyneth Sprout, MD 07/28/22 2332

## 2022-07-28 NOTE — Discharge Instructions (Addendum)
1. Follow the dietary instructions under bland diet.  You may be experiencing reflux or esophageal spasm.  You need to see your doctor and determine if you need an upper endoscopy. 2.  You may be experiencing musculoskeletal pain.  Because you have hypertension and decreased kidney function, you may not take medications in the category called NSAIDs.  This means you may not take ibuprofen\naproxen\Celebrex.  Have been prescribed Vicodin to take 1 to 2 tablets every 6 hours to help with pain control.  You can also try the muscle relaxer but try the muscle rubs, heating pad or massage

## 2022-07-28 NOTE — ED Triage Notes (Signed)
Pt c/o pain in right upper chest and left upper chest that radiate to the shouldersx2d. Pt states has pain with palpation and whenever he hits a bump when driving. Pt states took tylenol 1500mg  at 0300 today

## 2022-07-28 NOTE — ED Notes (Signed)
Patient verbalizes understanding of discharge instructions. Opportunity for questioning and answers were provided. Armband removed by staff, pt discharged from ED. Pt ambulatory to ED waiting room with steady gait.  

## 2022-07-28 NOTE — ED Provider Notes (Signed)
[place] [facility]ERGENCY DEPARTMENT AT [facility] Provider Note   CSN: [id number] Arrival date & time: 07/28/22  1251     History  Chief Complaint  Patient presents with   Chest Pain    [name] [name] is a 57 y.o. male.  HPI Patient reports for about 2 days he has had pain that is been in his chest and upper abdomen area.  Pain radiates up into the right shoulder and anterior chest and also at times is in the epigastrium.  He reports it sometimes worse with a deep breath or certain movement.  Patient denies he has significant associated shortness of breath.  No fever no productive cough.  No lower extremity swelling.  Patient does have history of hypertension.  Blood pressures have been controlled.  No injury.  Patient been trying extra strength Tylenol without relief.  Patient denies any exacerbation with certain foods.    Home Medications Prior to Admission medications   Medication Sig Start Date End Date Taking? Authorizing Provider  aspirin EC 81 MG tablet Take 1 tablet (81 mg total) by mouth daily. Swallow whole. 05/22/22   Erick Alley, DO  blood glucose meter kit and supplies KIT Dispense based on patient and insurance preference. Use up to four times daily as directed. 05/06/20   Sponseller, Eugene Gavia, PA-C  cholecalciferol (VITAMIN D3) 25 MCG (1000 UNIT) tablet Take 1 tablet (1,000 Units total) by mouth daily. 05/22/22   Erick Alley, DO  cloNIDine (CATAPRES) 0.1 MG tablet Take 1 tablet (0.1 mg total) by mouth 2 (two) times daily. 05/24/22   Derwood Kaplan, MD  clopidogrel (PLAVIX) 75 MG tablet Take 1 tablet (75 mg total) by mouth daily. 05/22/22   Erick Alley, DO  diazepam (VALIUM) 5 MG tablet Take 1 tablet (5 mg total) by mouth daily. 06/12/22   Achille Rich, PA-C  glucose blood test strip Use as instructed 05/06/20   Sponseller, Eugene Gavia, PA-C  HYDROcodone-acetaminophen (NORCO/VICODIN) 5-325 MG tablet Take 1-2 tablets by mouth every 6 (six) hours as needed. 07/28/22  Yes  Arby Barrette, MD  labetalol (NORMODYNE) 300 MG tablet Take 1 tablet (300 mg total) by mouth 2 (two) times daily. 05/22/22   Erick Alley, DO  NIFEdipine (PROCARDIA XL/NIFEDICAL-XL) 90 MG 24 hr tablet Take 1 tablet (90 mg total) by mouth daily. 05/23/22   Erick Alley, DO  pantoprazole (PROTONIX) 20 MG tablet Take 1 tablet (20 mg total) by mouth daily. 07/28/22  Yes Arby Barrette, MD  rosuvastatin (CRESTOR) 40 MG tablet Take 1 tablet (40 mg total) by mouth at bedtime. 05/22/22   Erick Alley, DO      Allergies    Hydralazine, Amlodipine, and Prednisone    Review of Systems   Review of Systems  Physical Exam Updated Vital Signs BP (!) 158/74   Pulse (!) 54   Temp 98.8 F (37.1 C) (Oral)   Resp 18   Ht 6' (1.829 m)   Wt (!) 138.2 kg   SpO2 98%   BMI 41.32 kg/m  Physical Exam Constitutional:      Comments: Alert nontoxic.  Moderately uncomfortable in appearance.  HENT:     Head: Normocephalic and atraumatic.     Mouth/Throat:     Pharynx: Oropharynx is clear.  Eyes:     Extraocular Movements: Extraocular movements intact.  Cardiovascular:     Rate and Rhythm: Normal rate and regular rhythm.  Pulmonary:     Effort: Pulmonary effort is normal.     Breath  sounds: Normal breath sounds.     Comments: Patient does perceive some discomfort with certain movements that is particularly localizing into the right upper chest.  Not specifically reproducible to compression or palpation. Musculoskeletal:        General: No swelling or tenderness. Normal range of motion.     Right lower leg: No edema.     Left lower leg: No edema.  Skin:    General: Skin is warm and dry.  Neurological:     General: No focal deficit present.     Mental Status: He is oriented to person, place, and time.     Motor: No weakness.     Coordination: Coordination normal.  Psychiatric:        Mood and Affect: Mood normal.     ED Results / Procedures / Treatments   Labs (all labs ordered are listed, but  only abnormal results are displayed) Labs Reviewed  COMPREHENSIVE METABOLIC PANEL - Abnormal; Notable for the following components:      Result Value   Glucose, Bld 132 (*)    BUN 32 (*)    Creatinine, Ser 2.01 (*)    Albumin 3.3 (*)    GFR, Estimated 38 (*)    All other components within normal limits  CBC WITH DIFFERENTIAL/PLATELET - Abnormal; Notable for the following components:   Hemoglobin 12.9 (*)    All other components within normal limits  BRAIN NATRIURETIC PEPTIDE  LIPASE, BLOOD  TROPONIN I (HIGH SENSITIVITY)  TROPONIN I (HIGH SENSITIVITY)    EKG EKG Interpretation  Date/Time:  Thursday July 28 2022 12:55:30 EDT Ventricular Rate:  76 PR Interval:  184 QRS Duration: 164 QT Interval:  414 QTC Calculation: 465 R Axis:   104 Text Interpretation: Normal sinus rhythm Right bundle branch block Abnormal ECG When compared with ECG of 12-Jun-2022 09:34, PREVIOUS ECG IS PRESENT old RBBB, anterir T wave inversion more pronounced compared to previous Confirmed by Arby Barrette [phone]) on 07/28/2022 1:40:25 PM  Radiology CT Angio Chest/Abd/Pel for Dissection W and/or W/WO  Result Date: 07/28/2022 CLINICAL DATA:  Acute aortic syndrome suspected EXAM: CT ANGIOGRAPHY CHEST, ABDOMEN AND PELVIS TECHNIQUE: Non-contrast CT of the chest was initially obtained. Multidetector CT imaging through the chest, abdomen and pelvis was performed using the standard protocol during bolus administration of intravenous contrast. Multiplanar reconstructed images and MIPs were obtained and reviewed to evaluate the vascular anatomy. RADIATION DOSE REDUCTION: This exam was performed according to the departmental dose-optimization program which includes automated exposure control, adjustment of the mA and/or kV according to patient size and/or use of iterative reconstruction technique. CONTRAST:  75mL OMNIPAQUE IOHEXOL 350 MG/ML SOLN COMPARISON:  Previous studies including chest radiograph done today FINDINGS:  CTA CHEST FINDINGS Cardiovascular: There is no demonstrable mural hematoma in thoracic aorta in the noncontrast images. There is homogeneous enhancement in thoracic aorta. Major branches of thoracic aorta appear patent. There are no intraluminal filling defects in central pulmonary artery branches. Mediastinum/Nodes: No significant lymphadenopathy is seen. Thyroid is enlarged. Lungs/Pleura: There is no focal pulmonary consolidation. There is no pleural effusion or pneumothorax. Musculoskeletal: Degenerative changes are noted in thoracic spine with anterior bony spurs and calcification in the anterior spinal ligament. Review of the MIP images confirms the above findings. CTA ABDOMEN AND PELVIS FINDINGS VASCULAR Aorta: There is no focal aneurysmal dilation or dissection. Celiac: Small atherosclerotic plaques are seen without significant narrowing. SMA: There is no significant narrowing Renals: Renal arteries are patent without significant narrowing. IMA: Patent. Inflow:  Unremarkable. Veins: Unremarkable. Review of the MIP images confirms the above findings. NON-VASCULAR Hepatobiliary: No focal abnormalities are seen in liver. Surgical clips are seen in gallbladder fossa. Pancreas: Unremarkable. Spleen: Unremarkable. Adrenals/Urinary Tract: Adrenals are unremarkable. There is no hydronephrosis. There are no renal or ureteral stones. There is 9 cm cyst in the upper pole of right kidney. Urinary bladder is unremarkable. Stomach/Bowel: Stomach is unremarkable. Small bowel loops are not dilated. The appendix is not dilated. There is no significant wall thickening in colon. There is no pericolic stranding or fluid collection. Lymphatic: Unremarkable. Reproductive: Unremarkable. Other: There is no ascites or pneumoperitoneum. Umbilical hernia containing fat is seen. There is diastasis between the rectus muscles superior to the umbilicus. Bilateral inguinal hernias containing fat are seen, larger on the right side.  Musculoskeletal: No recent fracture is seen. Degenerative changes are noted in thoracic and lumbar spine. There is minimal anterolisthesis at L4-L5 level and minimal retrolisthesis at the L5-S1 level. There is spinal stenosis at the L4-L5 level. There is encroachment of neural foramina at multiple levels in lumbar spine. Gynecomastia is seen in both breasts. Review of the MIP images confirms the above findings. IMPRESSION: There is no evidence of thoracic aortic dissection. There is no evidence of central pulmonary artery embolism. There is no evidence of abdominal aortic dissection or aneurysm. There is no focal pulmonary consolidation. There is no evidence of intestinal obstruction or pneumoperitoneum. Appendix is not dilated. There is no hydronephrosis. Lumbar spondylosis, most severe at the L4-L5 level. Bilateral inguinal hernias containing fat, larger on the right side. Medical hernia containing fat. Large right renal cyst. Other findings as described in the body of the report. Electronically Signed   By: Ernie Avena M.D.   On: 07/28/2022 17:11   DG Chest 2 View  Result Date: 07/28/2022 CLINICAL DATA:  Bilateral upper chest pain associated with palpitations EXAM: CHEST - 2 VIEW COMPARISON:  Chest radiograph dated 05/06/2021 FINDINGS: Normal lung volumes. No focal consolidations. No pleural effusion or pneumothorax. The heart size and mediastinal contours are within normal limits. No acute osseous abnormality. IMPRESSION: No active cardiopulmonary disease. Electronically Signed   By: Agustin Cree M.D.   On: 07/28/2022 13:48    Procedures Procedures    Medications Ordered in ED Medications  pantoprazole (PROTONIX) injection 40 mg (40 mg Intravenous Not Given 07/28/22 1435)  HYDROmorphone (DILAUDID) injection 1 mg (1 mg Intravenous Not Given 07/28/22 1709)  alum & mag hydroxide-simeth (MAALOX/MYLANTA) 200-200-20 MG/5ML suspension 30 mL (has no administration in time range)  famotidine (PEPCID)  tablet 40 mg (has no administration in time range)  HYDROcodone-acetaminophen (NORCO/VICODIN) 5-325 MG per tablet 2 tablet (has no administration in time range)  diazepam (VALIUM) tablet 5 mg (has no administration in time range)  morphine (PF) 4 MG/ML injection 4 mg (4 mg Intravenous Given 07/28/22 1434)  0.9 %  sodium chloride infusion ( Intravenous New Bag/Given 07/28/22 1433)  iohexol (OMNIPAQUE) 350 MG/ML injection 75 mL (75 mLs Intravenous Contrast Given 07/28/22 1649)    ED Course/ Medical Decision Making/ A&P                             Medical Decision Making Amount and/or Complexity of Data Reviewed Labs: ordered. Radiology: ordered.  Risk OTC drugs. Prescription drug management.   Resents as outlined.  She has had approximately 2 days of upper chest and thoracic pain.  Differential diagnosis includes aortic dissection\ACS\pneumonia\pneumothorax\pancreatitis\reflux\esophageal spasm.  Will initiate  broad diagnostic evaluation and start treatment with Protonix and morphine.  With morphine patient has some increased sensation of nausea and discomfort.  He initially refused Protonix but then we discussed the rationale for Protonix and especially in conjunction with other medications sometimes averting reflux and GERD symptoms.  He is agreeable to trying Protonix.  Chest x-ray interpreted by radiology no acute findings.  Troponin normal.  CBC normal with normal differential.  BUN and creatinine stable at 32 and 2.01.  LFTs within normal limits.  BNP 38.  Troponin 13.  Lipase 34.  This time we will proceed with CT dissection study.  Patient given additional dose of Dilaudid for continued pain.  CT study reviewed by radiology does not identify acute abnormalities.  PE and dissection ruled out.  No significant identified intra-abdominal etiology.  At this time with rule out of MI\PE\dissection\bowel obstruction or subdiaphragmatic process, will proceed with ongoing treatment for  possible esophageal spasm or reflux as well as consideration for musculoskeletal pain.  Will have patient continue on daily Protonix and take Vicodin 1 to 2 tablets as needed for pain control.  At this time with history of hypertension and borderline kidney function will not opt for NSAIDs.  I recommend close follow-up with PCP for recheck.        Final Clinical Impression(s) / ED Diagnoses Final diagnoses:  Chest pain, unspecified type    Rx / DC Orders ED Discharge Orders          Ordered    pantoprazole (PROTONIX) 20 MG tablet  Daily        07/28/22 1740    HYDROcodone-acetaminophen (NORCO/VICODIN) 5-325 MG tablet  Every 6 hours PRN        07/28/22 1740              Arby Barrette, MD 07/28/22 1744

## 2022-08-10 NOTE — Progress Notes (Unsigned)
Patient: [name] [name] Date of Birth: [birth year]  Reason for Visit: Follow up stroke clinic  History from: Patient Primary Neurologist: [name] [name]   ASSESSMENT AND PLAN 57 y.o. 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  Had hypertensive urgency.  Vascular risk factors of hypertension and hyperlipidemia.  Overall, doing well, essentially back to baseline.  Is aggressively working on management of vascular risk factors, has lost 60 lbs, BP improved but still slightly up 148/100. LDL much better at 72 (was 216).  -Continue aspirin 81 mg daily, can stop Plavix at this point for secondary stroke prevention -Recent lipid panel is much improved!  LDL 72 on Crestor 40 mg daily -Commended on weight loss, has lost 60 pounds since December, just started on Mounjaro -Ordered 30-day cardiac monitor to rule out A-fib -Continue close follow-up with PCP, cardiologist. Strict management of vascular risk factors with a goal BP less than 130/90, A1c less than 7.0, LDL less than 70 for secondary stroke prevention -Follow-up in 6 months or sooner if needed  HISTORY OF PRESENT ILLNESS: Today 08/11/22 [name] [name] here for stroke clinic follow-up.  Presented 05/18/2022 with dizziness and imbalance for several days.  BP was elevated 220/113. Reports he was taking BP medication at the time.  Admitted to the ICU for BP medication.  Outside of the window for tPA.  Found to have 3 small infarcts at right inferior frontal, right temporoparietal cortex, left basal ganglia infarct.  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  Crestor 40 mg daily was restarted. Started on Cozaar and labetalol for BP.  Back in the ER 05/24/22 for high blood pressure, started on clonidine.  Admitted to [facility] 06/12/22 BP was  220/112.  Had seen cardiology and placed on Imdur and spironolactone but he was not taking.  Creatinine was elevated at 2.5.  Repeat MRI of the brain showed no acute abnormality.   Today , mentions has not worn cardiac monitor to rule out AFIB. Remains on Crestor 40 mg daily. Reports physical last week. LDL was much better 72, cholesterol 151, triglycerides 128. A1C 6.6. creatinine 2.10. is doing intermittent fasting only eats once a day. Has lost 60 lbs since December. Has just been put on Mounjaro. BP today 148/100, on clonidine, Nifedipine, chlorthalidone, valsartan. Sees nephrologist. On aspirin 81 mg daily and Plavix 75 mg daily. Feels overall recovered from stroke, still working on getting on stamina back. Has been out of work since stroke, trying to get BP under control. 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 Uses CPAP nightly, going to get new sleep study. Has cardiologist at [facility].  Indicates strict medication compliance.  -CT head no acute abnormality -CTA head and neck no LVO.  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  Moderate stenosis in right V4 and left P2.  Mild to moderate stenosis in the basilar artery.  Mild stenosis in the left M1 and right P2. -MRI of the brain punctate acute infarcts in right inferomedial frontal lobe and temporoparietal cortex and likely subacute infarct in left lentiform nucleus -2D echo EF 60 to 65% -Recommended 30-day outpatient cardiac monitoring to rule out A-fib -LDL 216 -A1c 6.2 -No antithrombotic prior to admission, 3 weeks aspirin 81 mg and Plavix 75 mg daily then aspirin alone  HISTORY  05/18/22 Dr. Otelia [name]: [name] Ana  [name] is an 57 y.o. male with a PMHx of HTN and obesity who presented to the [facility] ED on Wednesday morning with a c/c of dizziness for the past 4 days that then worsened to the extent that he felt it prudent to be evaluated in the ED. He states that his dizziness felt like a combination of gait unsteadiness in conjunction  with movement-induced vertigo. He did not have any associated limb weakness but did feel that he was walking as though drunk. No headache, SOB or CP. There was no tendency to drift towards one side while ambulating. NIHSS on arrival to the ED was 0. He is on labetalol 400 mg twice a day in addition to Benicar. Blood pressures were severely elevated in the ED and he was started on a Cardene drip. CT in the ED at [facility] was unremarkable.    He was then transferred to [facility] for MRI, which revealed punctate acute infarcts in the right inferomedial frontal lobe and temporoparietal cortex, in addition to a likely subacute infarct in the left lentiform nucleus.  REVIEW OF SYSTEMS: Out of a complete 14 system review of symptoms, the patient complains only of the following symptoms, and all other reviewed systems are negative.  See HPI  ALLERGIES: Allergies  Allergen Reactions   Hydralazine Palpitations    Per patient medication makes his heart feel like it is pounding   Amlodipine Rash   Prednisone Hives    HOME MEDICATIONS: Outpatient Medications Prior to Visit  Medication Sig Dispense Refill   aspirin EC 81 MG tablet Take 1 tablet (81 mg total) by mouth daily. Swallow whole. 30 tablet 0   blood glucose meter kit and supplies KIT Dispense based on patient and insurance preference. Use up to four times daily as directed. 1 each 0   cloNIDine (CATAPRES) 0.1 MG tablet Take 1 tablet (0.1 mg total) by mouth 2 (two) times daily. 30 tablet 0   clopidogrel (PLAVIX) 75 MG tablet Take 1 tablet (75 mg total) by mouth daily. 19 tablet 0   glucose blood test strip Use as instructed 100 each 12   NIFEdipine (PROCARDIA XL/NIFEDICAL-XL) 90 MG 24 hr tablet Take 1 tablet (90 mg total) by mouth daily. 30 tablet 0   rosuvastatin (CRESTOR) 40 MG tablet Take 1 tablet (40 mg total) by mouth at bedtime. 30 tablet 0   cholecalciferol (VITAMIN D3) 25 MCG (1000 UNIT) tablet Take 1 tablet (1,000 Units total) by mouth daily.  (Patient not taking: Reported on 08/11/2022) 90 tablet 9   diazepam (VALIUM) 5 MG tablet Take 1 tablet (5 mg total) by mouth daily. (Patient not taking: Reported on 08/11/2022) 5 tablet 0   HYDROcodone-acetaminophen (NORCO/VICODIN) 5-325 MG tablet Take 1-2 tablets by mouth every 6 (six) hours as needed. (Patient not taking: Reported on 08/11/2022) 20 tablet 0   labetalol (NORMODYNE) 300 MG tablet Take 1 tablet (300 mg total) by mouth 2 (two) times daily. (Patient not taking: Reported on 08/11/2022) 60 tablet 0   tiZANidine (ZANAFLEX) 4 MG tablet Take 1 tablet (4 mg total) by mouth every 6 (six) hours as needed for muscle spasms. (Patient not taking: Reported on 08/11/2022) 30 tablet 0   No facility-administered medications prior to visit.    PAST MEDICAL HISTORY: Past Medical History:  Diagnosis Date   Diabetes mellitus without complication (HCC)    Hypertension    Stroke (HCC)     PAST SURGICAL HISTORY: Past Surgical History:  Procedure Laterality Date   CHOLECYSTECTOMY  FAMILY HISTORY: Family History  Problem Relation Age of Onset   Stroke Mother     SOCIAL HISTORY: Social History   Socioeconomic History   Marital status: Unknown    Spouse name: Not on file   Number of children: Not on file   Years of education: Not on file   Highest education level: Not on file  Occupational History   Not on file  Tobacco Use   Smoking status: Never   Smokeless tobacco: Never  Substance and Sexual Activity   Alcohol use: Never   Drug use: Never   Sexual activity: Not on file  Other Topics Concern   Not on file  Social History Narrative   Lives alone with daughter   Right handed   Caffeine- none   Social Determinants of Health   Financial Resource Strain: Not on file  Food Insecurity: No Food Insecurity (05/20/2022)   Hunger Vital Sign    Worried About Running Out of Food in the Last Year: Never true    Ran Out of Food in the Last Year: Never true  Transportation Needs: No  Transportation Needs (05/20/2022)   PRAPARE - Administrator, Civil Service (Medical): No    Lack of Transportation (Non-Medical): No  Physical Activity: Not on file  Stress: Not on file  Social Connections: Not on file  Intimate Partner Violence: Not At Risk (05/20/2022)   Humiliation, Afraid, Rape, and Kick questionnaire    Fear of Current or Ex-Partner: No    Emotionally Abused: No    Physically Abused: No    Sexually Abused: No    PHYSICAL EXAM  Vitals:   08/11/22 0802  BP: (!) 148/100  Pulse: 71  SpO2: 98%  Weight: (!) 323 lb (146.5 kg)  Height: 6' (1.829 m)   Body mass index is 43.81 kg/m.  Generalized: Well developed, in no acute distress  Neurological examination  Mentation: Alert oriented to time, place, history taking. Follows all commands speech and language fluent Cranial nerve II-XII: Pupils were equal round reactive to light. Extraocular movements were full, visual field were full on confrontational test. Facial sensation and strength were normal.  Head turning and shoulder shrug  were normal and symmetric. Motor: The motor testing reveals 5 over 5 strength of all 4 extremities. Good symmetric motor tone is noted throughout.  Sensory: Sensory testing is intact to soft touch on all 4 extremities. No evidence of extinction is noted.  Coordination: Cerebellar testing reveals good finger-nose-finger and heel-to-shin bilaterally.  Gait and station: Gait is normal for large body habitus, tandem gait is slightly unsteady.  Romberg was deferred. Reflexes: Deep tendon reflexes are symmetric and normal bilaterally.   DIAGNOSTIC DATA (LABS, IMAGING, TESTING) - I reviewed patient records, labs, notes, testing and imaging myself where available.  Lab Results  Component Value Date   WBC 8.0 07/28/2022   HGB 12.9 (L) 07/28/2022   HCT 39.5 07/28/2022   MCV 91.9 07/28/2022   PLT 240 07/28/2022      Component Value Date/Time   NA 137 07/28/2022 1424   K 4.4  07/28/2022 1424   CL 103 07/28/2022 1424   CO2 26 07/28/2022 1424   GLUCOSE 132 (H) 07/28/2022 1424   BUN 32 (H) 07/28/2022 1424   CREATININE 2.01 (H) 07/28/2022 1424   CALCIUM 9.2 07/28/2022 1424   PROT 7.2 07/28/2022 1424   ALBUMIN 3.3 (L) 07/28/2022 1424   AST 17 07/28/2022 1424   ALT 17 07/28/2022 1424  ALKPHOS 50 07/28/2022 1424   BILITOT 0.8 07/28/2022 1424   GFRNONAA 38 (L) 07/28/2022 1424   Lab Results  Component Value Date   CHOL 297 (H) 05/19/2022   HDL 52 05/19/2022   LDLCALC 216 (H) 05/19/2022   TRIG 145 05/19/2022   CHOLHDL 5.7 05/19/2022   Lab Results  Component Value Date   HGBA1C 6.2 (H) 05/19/2022   No results found for: "VITAMINB12" No results found for: "TSH"  Margie Ege, AGNP-C, DNP 08/11/2022, 8:49 AM [facility] [address], [address] [place], [place] [address] [phone]

## 2022-08-11 ENCOUNTER — Other Ambulatory Visit: Payer: Self-pay | Admitting: Neurology

## 2022-08-11 ENCOUNTER — Ambulatory Visit: Payer: BC Managed Care – PPO | Admitting: Neurology

## 2022-08-11 ENCOUNTER — Encounter: Payer: Self-pay | Admitting: Neurology

## 2022-08-11 VITALS — BP 148/100 | HR 71 | Ht 72.0 in | Wt 323.0 lb

## 2022-08-11 DIAGNOSIS — I6330 Cerebral infarction due to thrombosis of unspecified cerebral artery: Secondary | ICD-10-CM

## 2022-08-11 DIAGNOSIS — I10 Essential (primary) hypertension: Secondary | ICD-10-CM | POA: Diagnosis not present

## 2022-08-11 DIAGNOSIS — I4510 Unspecified right bundle-branch block: Secondary | ICD-10-CM

## 2022-08-11 DIAGNOSIS — N1832 Chronic kidney disease, stage 3b: Secondary | ICD-10-CM

## 2022-08-11 DIAGNOSIS — R42 Dizziness and giddiness: Secondary | ICD-10-CM

## 2022-08-11 NOTE — Patient Instructions (Addendum)
I will order 30 day cardiac monitor study   Continue aspirin 81 mg daily, can stop the Plavix at this point  Continue work with your primary care doctor Strict management of vascular risk factors with a goal BP less than 130/90, A1c less than 7.0, LDL less than 70 for secondary stroke prevention  Follow up in 6 months or sooner if needed

## 2022-08-11 NOTE — Progress Notes (Signed)
I agree with the above plan 

## 2023-02-03 IMAGING — CR DG CHEST 1V
1 series · 1 of 1 positions shown · non-contrast
Comparison: May 03, 2020

CLINICAL DATA: Weakness

EXAM:
CHEST  1 VIEW

[chest pa]
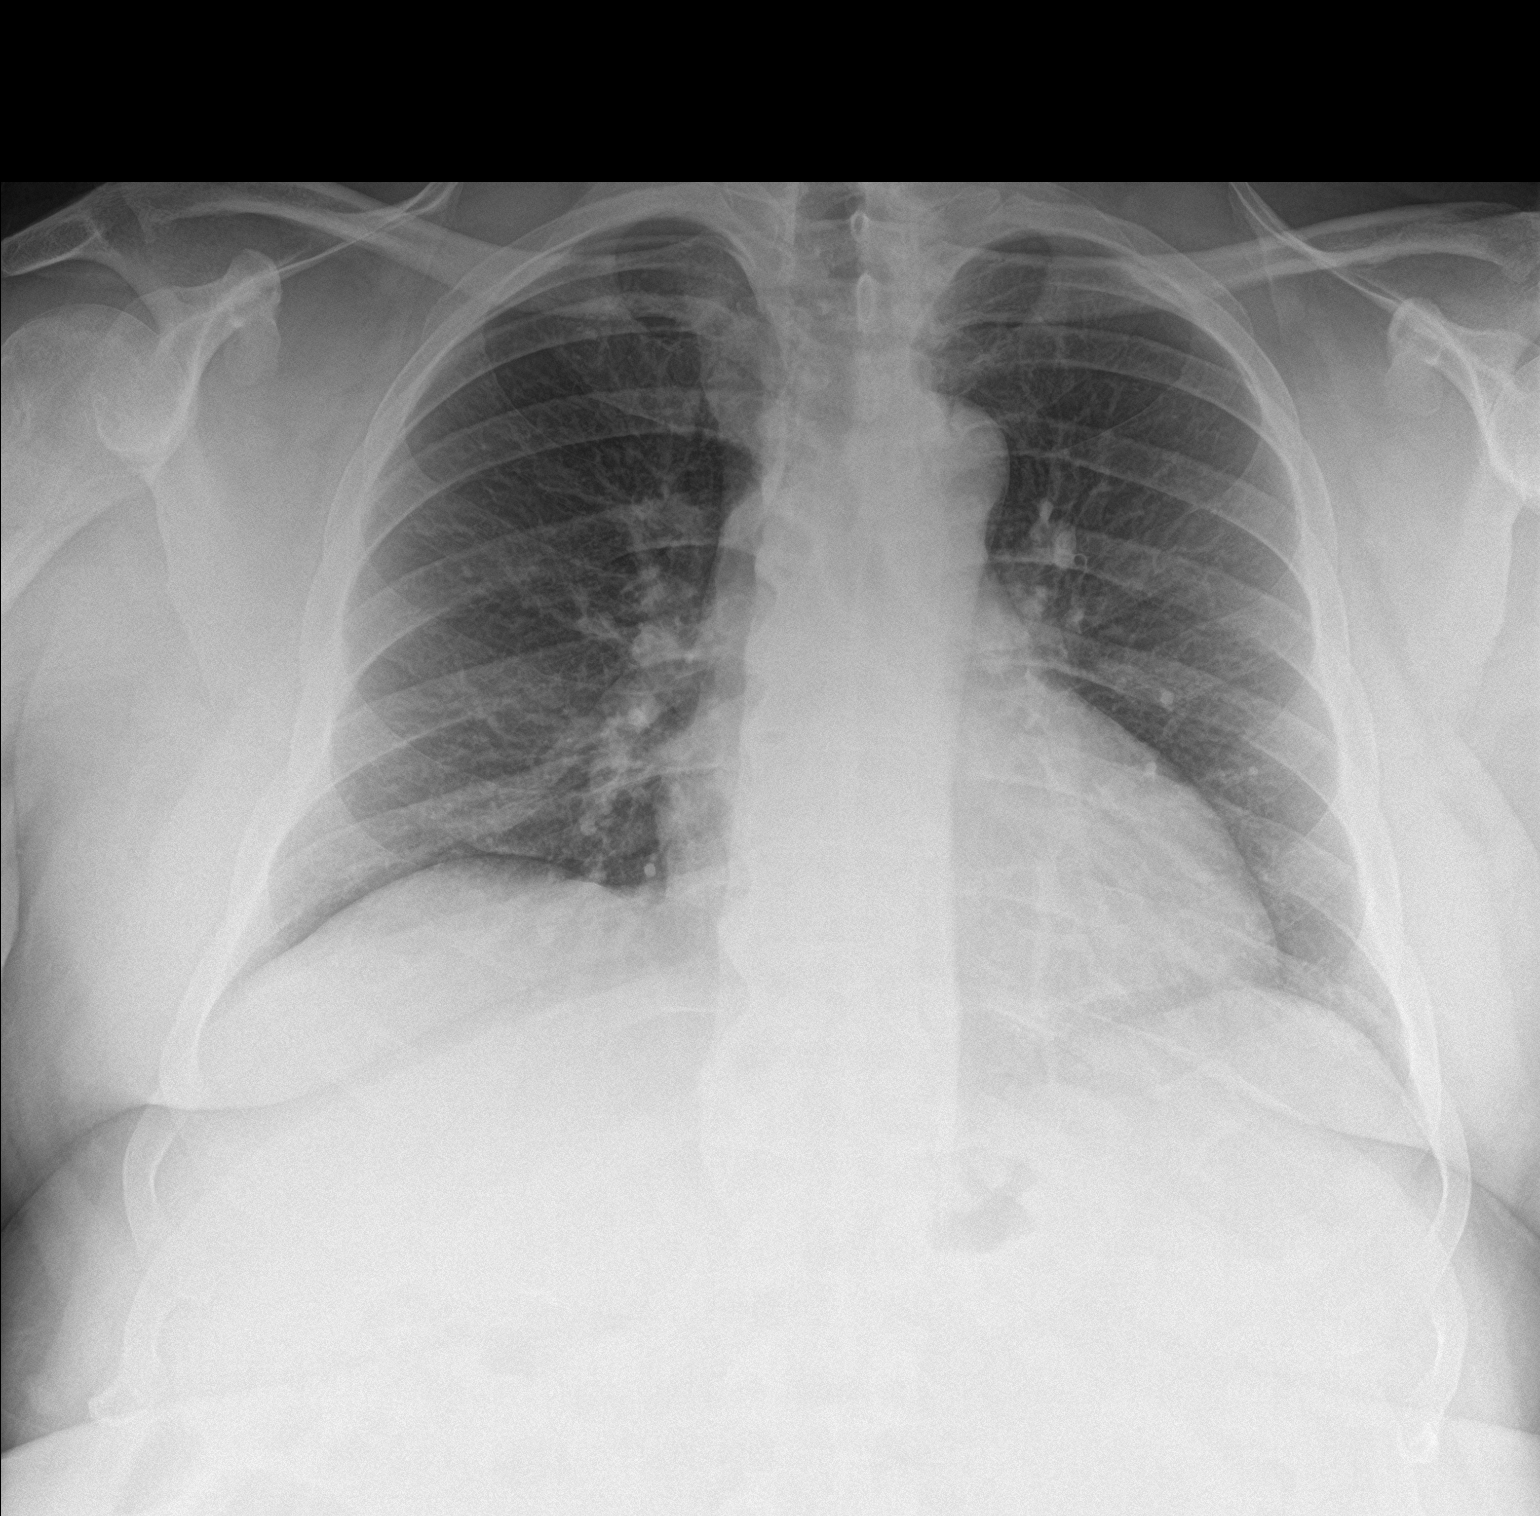

[1 of 1 positions shown; findings below may reference images not displayed]

FINDINGS: Lungs are clear. Heart is upper normal in size with pulmonary
vascularity normal. No adenopathy. No bone lesions.
IMPRESSION: Lungs clear.  Heart upper normal in size.

## 2023-02-27 NOTE — Progress Notes (Signed)
 Patient: [name] [name] Date of Birth: [birth year]  Reason for Visit: Follow up stroke clinic  History from: Patient Primary Neurologist: Rosemarie   ASSESSMENT AND PLAN 58 y.o. 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  Had hypertensive urgency.  Vascular risk factors of hypertension and hyperlipidemia.  Overall, doing well, essentially back to baseline.  Has done well to lose 60 pounds.  LDL much better at 72 (was 216).  -Continue aspirin  81 mg daily for secondary stroke prevention -Recent lipid panel is much improved!  LDL 72 on Crestor  40 mg daily, will be due for recheck soon  -Continue to work on weight loss, is planning to resume Mounjaro -Ordered 30-day cardiac monitor to rule out A-fib -Continue close follow-up with PCP, cardiologist. Strict management of vascular risk factors with a goal BP less than 130/90, A1c less than 7.0, LDL less than 70 for secondary stroke prevention -Continue CPAP use, follow-up with prescribing provider -Follow-up at our office as needed  HISTORY OF PRESENT ILLNESS: Today 02/28/23 I ordered cardiac monitor, has yet to be completed. Reports he saw his cardiologist but was told cardiac monitor was not needed. Remains on aspirin  81 mg daily.  BP was up today 161/94, didn't take medications yet. He is back to work. 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 Had stopped Mounjaro, has plateaued at 316 lb, planning to get back on. He has had some pain to left hand, thinks work related. Feels back to baseline from stroke, but does get more tired at work. In September 2024 A1C 6.9, creatinine 2.20. remains on Coreg, clonidine , valsartan, nifedipine , Crestor . He uses CPAP machine.   08/11/22 SS: [name] [name] here for stroke clinic follow-up.  Presented 05/18/2022 with dizziness and  imbalance for several days.  BP was elevated 220/113. Reports he was taking BP medication at the time.  Admitted to the ICU for BP medication.  Outside of the window for tPA.  Found to have 3 small infarcts at right inferior frontal, right temporoparietal cortex, left basal ganglia infarct.  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  Crestor  40 mg daily was restarted. Started on Cozaar  and labetalol  for BP.  Back in the ER 05/24/22 for high blood pressure, started on clonidine .  Admitted to [facility] 06/12/22 BP was 220/112.  Had seen cardiology and placed on Imdur and spironolactone but he was not taking.  Creatinine was elevated at 2.5.  Repeat MRI of the brain showed no acute abnormality.   Today , mentions has not worn cardiac monitor to rule out AFIB. Remains on Crestor  40 mg daily. Reports physical last week. LDL was much better 72, cholesterol 151, triglycerides 128. A1C 6.6. creatinine 2.10. is doing intermittent fasting only eats once a day. Has lost 60 lbs since December. Has just been put on Mounjaro. BP today 148/100, on clonidine , Nifedipine , chlorthalidone, valsartan. Sees nephrologist. On aspirin  81 mg daily and Plavix  75 mg daily. Feels overall recovered from stroke, still working on getting on stamina back. Has been out of work since stroke, trying to get BP under control. 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 Uses CPAP nightly, going to get new sleep study. Has cardiologist at [facility].  Indicates strict medication compliance.  -CT head no acute abnormality -CTA head and neck no LVO.  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  Moderate stenosis in right V4 and left P2.  Mild to moderate stenosis in the basilar artery.  Mild stenosis in the left M1 and right P2. -MRI of the brain punctate acute infarcts in right inferomedial frontal lobe and temporoparietal cortex and likely subacute infarct in left lentiform nucleus -2D  echo EF 60 to 65% -Recommended 30-day outpatient cardiac monitoring to rule out A-fib -LDL 216 -A1c 6.2 -No antithrombotic prior to admission, 3 weeks aspirin  81 mg and Plavix  75 mg daily then aspirin  alone  HISTORY  05/18/22 Dr. Merrianne: [name] [name] is an 58 y.o. male with a PMHx of HTN and obesity who presented to the [facility] ED on Wednesday morning with a c/c of dizziness for the past 4 days that then worsened to the extent that he felt it prudent to be evaluated in the ED. He states that his dizziness felt like a combination of gait unsteadiness in conjunction with movement-induced vertigo. He did not have any associated limb weakness but did feel that he was walking as though drunk. No headache, SOB or CP. There was no tendency to drift towards one side while ambulating. NIHSS on arrival to the ED was 0. He is on labetalol  400 mg twice a day in addition to Benicar. Blood pressures were severely elevated in the ED and he was started on a Cardene  drip. CT in the ED at [facility] was unremarkable.    He was then transferred to [facility] for MRI, which revealed punctate acute infarcts in the right inferomedial frontal lobe and temporoparietal cortex, in addition to a likely subacute infarct in the left lentiform nucleus.  REVIEW OF SYSTEMS: Out of a complete 14 system review of symptoms, the patient complains only of the following symptoms, and all other reviewed systems are negative.  See HPI  ALLERGIES: Allergies  Allergen Reactions   Hydralazine  Palpitations    Per patient medication makes his heart feel like it is pounding   Amlodipine  Rash   Prednisone Hives    HOME MEDICATIONS: Outpatient Medications Prior to Visit  Medication Sig Dispense Refill   aspirin  EC 81 MG tablet Take 1 tablet (81 mg total) by mouth daily. Swallow whole. 30 tablet 0   blood glucose meter kit and supplies KIT Dispense based on patient and insurance preference. Use up to four times daily as directed. 1 each 0   carvedilol  (COREG) 25 MG tablet Take 1 tablet by mouth 2 (two) times daily.     cloNIDine  (CATAPRES ) 0.1 MG tablet Take 1 tablet (0.1 mg total) by mouth 2 (two) times daily. 30 tablet 0   diazepam  (VALIUM ) 5 MG tablet Take 1 tablet (5 mg total) by mouth daily. 5 tablet 0   glucose blood test strip Use as instructed 100 each 12   NIFEdipine  (PROCARDIA  XL/NIFEDICAL-XL) 90 MG 24 hr tablet Take 1 tablet (90 mg total) by mouth daily. 30 tablet 0   NIFEdipine  (PROCARDIA  XL/NIFEDICAL-XL) 90 MG 24 hr tablet Take 90 mg by mouth 2 (two) times daily.     rosuvastatin  (CRESTOR ) 40 MG tablet Take 1 tablet (40 mg total) by mouth at bedtime. 30 tablet 0   cholecalciferol  (VITAMIN D3) 25 MCG (1000 UNIT) tablet Take 1 tablet (1,000 Units total) by mouth daily. (Patient not taking: Reported on 08/11/2022) 90 tablet 9   clopidogrel  (PLAVIX ) 75 MG tablet Take 1 tablet (75 mg total) by mouth daily. 19 tablet 0   HYDROcodone -acetaminophen  (NORCO/VICODIN) 5-325 MG tablet Take 1-2 tablets  by mouth every 6 (six) hours as needed. (Patient not taking: Reported on 08/11/2022) 20 tablet 0   labetalol  (NORMODYNE ) 300 MG tablet Take 1 tablet (300 mg total) by mouth 2 (two) times daily. (Patient not taking: Reported on 08/11/2022) 60 tablet 0   tiZANidine  (ZANAFLEX ) 4 MG tablet Take 1 tablet (4 mg total) by mouth every 6 (six) hours as needed for muscle spasms. (Patient not taking: Reported on 08/11/2022) 30 tablet 0   No facility-administered medications prior to visit.    PAST MEDICAL HISTORY: Past Medical History:  Diagnosis Date   Diabetes mellitus without complication (HCC)    Hypertension    Stroke (HCC)     PAST SURGICAL HISTORY: Past Surgical History:  Procedure Laterality Date   CHOLECYSTECTOMY      FAMILY HISTORY: Family History  Problem Relation Age of Onset   Stroke Mother     SOCIAL HISTORY: Social History   Socioeconomic History   Marital status: Unknown    Spouse name: Not on file   Number of children: Not  on file   Years of education: Not on file   Highest education level: Not on file  Occupational History   Not on file  Tobacco Use   Smoking status: Never   Smokeless tobacco: Never  Substance and Sexual Activity   Alcohol use: Never   Drug use: Never   Sexual activity: Not on file  Other Topics Concern   Not on file  Social History Narrative   Lives alone with daughter   Right handed   Caffeine- none   Social Drivers of Health   Financial Resource Strain: Low Risk  (08/03/2022)   Received from [facility], [facility]   Overall Financial Resource Strain (CARDIA)    Difficulty of Paying Living Expenses: Not hard at all  Food Insecurity: No Food Insecurity (08/03/2022)   Received from [facility], [facility]   Hunger Vital Sign    Worried About Running Out of Food in the Last Year: Never true    Ran Out of Food in the Last Year: Never true  Transportation Needs: No Transportation Needs (08/03/2022)   Received from [facility], [facility]   PRAPARE - Transportation    Lack of Transportation (Medical): No    Lack of Transportation (Non-Medical): No  Physical Activity: Insufficiently Active (08/03/2022)   Received from [facility], [facility]   Exercise Vital Sign    Days of Exercise per Week: 2 days    Minutes of Exercise per Session: 30 min  Stress: No Stress Concern Present (08/03/2022)   Received from [facility], [facility] - Occupational Stress Questionnaire    Feeling of Stress : Only a little  Social Connections: Somewhat Isolated (08/03/2022)   Received from [facility], [facility]   Social Network    How would you rate your social network (family, work, friends)?: Restricted participation with some degree of social isolation  Intimate Partner Violence: Not At Risk (08/03/2022)   Received from [facility], [facility]   HITS    Over the last 12 months how often did your partner physically hurt  you?: Never    Over the last 12 months how often did your partner insult you or talk down to you?: Never    Over the last 12 months how often did your partner threaten you with physical harm?: Never    Over the last 12 months how often did your partner  scream or curse at you?: Never    PHYSICAL EXAM  Vitals:   02/28/23 0830  BP: (!) 161/94  Pulse: 69  Weight: (!) 316 lb 6.4 oz (143.5 kg)  Height: 6' (1.829 m)   Body mass index is 42.91 kg/m.  Generalized: Well developed, in no acute distress  Neurological examination  Mentation: Alert oriented to time, place, history taking. Follows all commands speech and language fluent Cranial nerve II-XII: Pupils were equal round reactive to light. Extraocular movements were full, visual field were full on confrontational test. Facial sensation and strength were normal.  Head turning and shoulder shrug  were normal and symmetric. Motor: The motor testing reveals 5 over 5 strength of all 4 extremities. Good symmetric motor tone is noted throughout.  Sensory: Sensory testing is intact to soft touch on all 4 extremities. No evidence of extinction is noted.  Coordination: Cerebellar testing reveals good finger-nose-finger and heel-to-shin bilaterally.  Gait and station: Gait is normal for large body habitus Reflexes: Deep tendon reflexes are symmetric but decreased  DIAGNOSTIC DATA (LABS, IMAGING, TESTING) - I reviewed patient records, labs, notes, testing and imaging myself where available.  Lab Results  Component Value Date   WBC 8.0 07/28/2022   HGB 12.9 (L) 07/28/2022   HCT 39.5 07/28/2022   MCV 91.9 07/28/2022   PLT 240 07/28/2022      Component Value Date/Time   NA 137 07/28/2022 1424   K 4.4 07/28/2022 1424   CL 103 07/28/2022 1424   CO2 26 07/28/2022 1424   GLUCOSE 132 (H) 07/28/2022 1424   BUN 32 (H) 07/28/2022 1424   CREATININE 2.01 (H) 07/28/2022 1424   CALCIUM  9.2 07/28/2022 1424   PROT 7.2 07/28/2022 1424   ALBUMIN 3.3  (L) 07/28/2022 1424   AST 17 07/28/2022 1424   ALT 17 07/28/2022 1424   ALKPHOS 50 07/28/2022 1424   BILITOT 0.8 07/28/2022 1424   GFRNONAA 38 (L) 07/28/2022 1424   Lab Results  Component Value Date   CHOL 297 (H) 05/19/2022   HDL 52 05/19/2022   LDLCALC 216 (H) 05/19/2022   TRIG 145 05/19/2022   CHOLHDL 5.7 05/19/2022   Lab Results  Component Value Date   HGBA1C 6.2 (H) 05/19/2022   No results found for: VITAMINB12 No results found for: TSH  Lauraine Born, AGNP-C, DNP 02/28/2023, 10:16 AM [facility] [address] [place], [place] [address] [phone]

## 2023-02-28 ENCOUNTER — Other Ambulatory Visit: Payer: Self-pay | Admitting: Neurology

## 2023-02-28 ENCOUNTER — Encounter: Payer: Self-pay | Admitting: Neurology

## 2023-02-28 ENCOUNTER — Ambulatory Visit: Payer: BC Managed Care – PPO | Admitting: Neurology

## 2023-02-28 VITALS — BP 161/94 | HR 69 | Ht 72.0 in | Wt 316.4 lb

## 2023-02-28 DIAGNOSIS — I6330 Cerebral infarction due to thrombosis of unspecified cerebral artery: Secondary | ICD-10-CM | POA: Diagnosis not present

## 2023-02-28 DIAGNOSIS — I4510 Unspecified right bundle-branch block: Secondary | ICD-10-CM

## 2023-02-28 DIAGNOSIS — R42 Dizziness and giddiness: Secondary | ICD-10-CM

## 2023-02-28 DIAGNOSIS — I10 Essential (primary) hypertension: Secondary | ICD-10-CM | POA: Diagnosis not present

## 2023-02-28 NOTE — Patient Instructions (Addendum)
 Continue aspirin  81 mg daily for secondary stroke prevention  Strict management of vascular risk factors with a goal BP less than 130/90, A1c less than 7.0, LDL less than 70 for secondary stroke prevention Recommended 30 day cardiac monitor to rule out AFIB as cause of stroke, If you want me to order again please let me know Continue to see your primary care doctor  Follow up here as needed

## 2023-04-19 ENCOUNTER — Telehealth: Payer: Self-pay | Admitting: Neurology

## 2023-04-19 NOTE — Telephone Encounter (Signed)
 Received clearance paperwork for colonoscopy.  I last saw him in January 2025, ordered cardiac monitor 30 days for the second time.  I do not see it has been completed.  Please ensure he has this done to rule out A-fib. I completed his clearance, but note of the above on paperwork.

## 2023-04-19 NOTE — Telephone Encounter (Signed)
 Call to patient, no answer. Left voicemail to return call.

## 2023-04-20 ENCOUNTER — Telehealth: Payer: Self-pay

## 2023-04-24 NOTE — Telephone Encounter (Signed)
 Lvm 2nd attempt by hf 04/24/23

## 2023-04-26 NOTE — Telephone Encounter (Signed)
 Lvm 3rd and final attempt Mychart msg sent

## 2023-08-21 ENCOUNTER — Emergency Department (HOSPITAL_BASED_OUTPATIENT_CLINIC_OR_DEPARTMENT_OTHER): Admitting: Radiology

## 2023-08-21 ENCOUNTER — Emergency Department (HOSPITAL_BASED_OUTPATIENT_CLINIC_OR_DEPARTMENT_OTHER)
Admission: EM | Admit: 2023-08-21 | Discharge: 2023-08-21 | Disposition: A | Discharge status: Home / Self Care | Attending: Student | Admitting: Student

## 2023-08-21 ENCOUNTER — Other Ambulatory Visit: Payer: Self-pay

## 2023-08-21 ENCOUNTER — Emergency Department (HOSPITAL_BASED_OUTPATIENT_CLINIC_OR_DEPARTMENT_OTHER)

## 2023-08-21 ENCOUNTER — Encounter (HOSPITAL_BASED_OUTPATIENT_CLINIC_OR_DEPARTMENT_OTHER): Payer: Self-pay

## 2023-08-21 DIAGNOSIS — Z8673 Personal history of transient ischemic attack (TIA), and cerebral infarction without residual deficits: Secondary | ICD-10-CM | POA: Insufficient documentation

## 2023-08-21 DIAGNOSIS — W208XXA Other cause of strike by thrown, projected or falling object, initial encounter: Secondary | ICD-10-CM | POA: Insufficient documentation

## 2023-08-21 DIAGNOSIS — N189 Chronic kidney disease, unspecified: Secondary | ICD-10-CM | POA: Insufficient documentation

## 2023-08-21 DIAGNOSIS — S81802A Unspecified open wound, left lower leg, initial encounter: Secondary | ICD-10-CM | POA: Insufficient documentation

## 2023-08-21 DIAGNOSIS — Z79899 Other long term (current) drug therapy: Secondary | ICD-10-CM | POA: Insufficient documentation

## 2023-08-21 DIAGNOSIS — Z7982 Long term (current) use of aspirin: Secondary | ICD-10-CM | POA: Diagnosis not present

## 2023-08-21 DIAGNOSIS — I129 Hypertensive chronic kidney disease with stage 1 through stage 4 chronic kidney disease, or unspecified chronic kidney disease: Secondary | ICD-10-CM | POA: Insufficient documentation

## 2023-08-21 DIAGNOSIS — R600 Localized edema: Secondary | ICD-10-CM | POA: Diagnosis not present

## 2023-08-21 MED ORDER — CEPHALEXIN 500 MG PO CAPS: 500.0000 mg | ORAL_CAPSULE | Freq: Four times a day (QID) | ORAL | 0 refills | Status: AC

## 2023-08-21 MED ORDER — CEPHALEXIN 250 MG PO CAPS
500.0000 mg | ORAL_CAPSULE | Freq: Once | ORAL | Status: AC
  Administered 2023-08-21: 500 mg via ORAL
  Filled 2023-08-21: qty 2

## 2023-08-21 MED ORDER — ACETAMINOPHEN 500 MG PO TABS
1000.0000 mg | ORAL_TABLET | Freq: Once | ORAL | Status: AC
  Administered 2023-08-21: 1000 mg via ORAL
  Filled 2023-08-21: qty 2

## 2023-08-21 NOTE — ED Notes (Signed)
 RN reviewed discharge instructions with pt. Pt verbalized understanding and had no further questions. VSS upon discharge.

## 2023-08-21 NOTE — ED Triage Notes (Signed)
 Pt states he dropped a window onto L calf 6/26, it's not getting better- burning sensation that won't go away. Advises he has been using neosporin & taking care of it as best I can but it's just not getting better. Denies fever, additional symptoms. Ambulatory to triage, slight favoring of L leg.

## 2023-08-21 NOTE — ED Notes (Signed)
 Wound cleaned with soap and water, dressed with nonadherent and kerlex

## 2023-08-21 NOTE — ED Provider Notes (Signed)
 [place] EMERGENCY DEPARTMENT AT [facility] Provider Note   CSN: [id number] Arrival date & time: 08/21/23  1711     Patient presents with: No chief complaint on file.  HPI Abdulhadi Stopa is a 58 y.o. male with history of AKI, CVA, CKD, hypertension presenting for left calf wound.  He states that on 6/26 a door fell on the back of his lower left leg.  He sustained an abrasion there and has been treating at home with hydroperoxide and Neosporin but he states that it is more tender and painful and red around the area.  He denies fever.  Does endorse some swelling in that area but denies radiation up or down his leg.  Still able to ambulate and bear weight.     HPI     Prior to Admission medications   Medication Sig Start Date End Date Taking? Authorizing Provider  cephALEXin  (KEFLEX ) 500 MG capsule Take 1 capsule (500 mg total) by mouth 4 (four) times daily. 08/21/23  Yes Lang Norleen POUR, PA-C  aspirin  EC 81 MG tablet Take 1 tablet (81 mg total) by mouth daily. Swallow whole. 05/22/22   Joshua Domino, DO  blood glucose meter kit and supplies KIT Dispense based on patient and insurance preference. Use up to four times daily as directed. 05/06/20   Sponseller, Pleasant R, PA-C  carvedilol (COREG) 25 MG tablet Take 1 tablet by mouth 2 (two) times daily. 01/23/23   [provider]  cloNIDine  (CATAPRES ) 0.1 MG tablet Take 1 tablet (0.1 mg total) by mouth 2 (two) times daily. 05/24/22   Charlyn Sora, MD  diazepam  (VALIUM ) 5 MG tablet Take 1 tablet (5 mg total) by mouth daily. 06/12/22   Bernis Ernst, PA-C  glucose blood test strip Use as instructed 05/06/20   Sponseller, Rebekah R, PA-C  NIFEdipine  (PROCARDIA  XL/NIFEDICAL-XL) 90 MG 24 hr tablet Take 1 tablet (90 mg total) by mouth daily. 05/23/22   Joshua Domino, DO  NIFEdipine  (PROCARDIA  XL/NIFEDICAL-XL) 90 MG 24 hr tablet Take 90 mg by mouth 2 (two) times daily. 08/11/21   [provider]  rosuvastatin  (CRESTOR ) 40 MG tablet Take  1 tablet (40 mg total) by mouth at bedtime. 05/22/22   Joshua Domino, DO    Allergies: Hydralazine , Amlodipine , and Prednisone    Review of Systems See HPI  Updated Vital Signs BP (!) 159/88   Pulse 76   Temp 98.3 F (36.8 C)   Resp 16   SpO2 92%    Physical Exam   Vitals:   08/21/23 1730  BP: (!) 159/88  Pulse: 76  Resp: 16  Temp: 98.3 F (36.8 C)  SpO2: 92%    CONSTITUTIONAL:  well-appearing, NAD NEURO:  Alert and oriented x 3, CN 3-12 grossly intact EYES:  eyes equal and reactive ENT/NECK:  Supple, no stridor CARDIO:  regular rate and rhythm, appears well-perfused, pedal pulses are 2+ bilaterally. PULM:  No respiratory distress GI/GU:  non-distended MSK/SPINE:  No gross deformities, no edema, moves all extremities.  Open wound in the left calf.  No palpable fluctuance.  No pustular oozing.  There is surrounding mild erythema and edema.   SKIN:  no rash, left calf wound   *Additional and/or pertinent findings included in MDM below    (all labs ordered are listed, but only abnormal results are displayed) Labs Reviewed - No data to display  EKG: None  Radiology: US  Venous Img Lower  Left (DVT Study) Result Date: 08/21/2023 CLINICAL DATA:  Left lower  extremity edema and rash. EXAM: LEFT LOWER EXTREMITY VENOUS DOPPLER ULTRASOUND TECHNIQUE: Gray-scale sonography with graded compression, as well as color Doppler and duplex ultrasound were performed to evaluate the lower extremity deep venous systems from the level of the common femoral vein and including the common femoral, femoral, profunda femoral, popliteal and calf veins including the posterior tibial, peroneal and gastrocnemius veins when visible. The superficial great saphenous vein was also interrogated. Spectral Doppler was utilized to evaluate flow at rest and with distal augmentation maneuvers in the common femoral, femoral and popliteal veins. COMPARISON:  None Available. FINDINGS: Contralateral Common Femoral  Vein: Respiratory phasicity is normal and symmetric with the symptomatic side. No evidence of thrombus. Normal compressibility. Common Femoral Vein: No evidence of thrombus. Normal compressibility, respiratory phasicity and response to augmentation. Saphenofemoral Junction: No evidence of thrombus. Normal compressibility and flow on color Doppler imaging. Profunda Femoral Vein: No evidence of thrombus. Normal compressibility and flow on color Doppler imaging. Femoral Vein: No evidence of thrombus. Normal compressibility, respiratory phasicity and response to augmentation. Popliteal Vein: No evidence of thrombus. Normal compressibility, respiratory phasicity and response to augmentation. Calf Veins: No evidence of thrombus. Normal compressibility and flow on color Doppler imaging. Superficial Great Saphenous Vein: No evidence of thrombus. Normal compressibility. Venous Reflux:  None. Other Findings:  None. IMPRESSION: No evidence of deep venous thrombosis within the LEFT lower extremity. Electronically Signed   By: Suzen Dials M.D.   On: 08/21/2023 19:07   DG Tibia/Fibula Left Result Date: 08/21/2023 CLINICAL DATA:  Patient dropped a window on to left calf on August 10, 2023 with subsequent burning sensation. EXAM: LEFT TIBIA AND FIBULA - 2 VIEW COMPARISON:  None Available. FINDINGS: There is no evidence of an acute fracture or other focal bone lesions. Moderate severity diffuse soft tissue swelling is seen which may be, in part, related to the patient's body habitus. IMPRESSION: Moderate severity diffuse soft tissue swelling without evidence of an acute osseous abnormality. Electronically Signed   By: Suzen Dials M.D.   On: 08/21/2023 19:06     Procedures   Medications Ordered in the ED  cephALEXin  (KEFLEX ) capsule 500 mg (has no administration in time range)  acetaminophen  (TYLENOL ) tablet 1,000 mg (has no administration in time range)                                    Medical Decision  Making Amount and/or Complexity of Data Reviewed Radiology: ordered.   58 year old well-appearing male presenting for left calf wound.  Exam notable for what appears to be an open wound in the left calf with surrounding findings suggestive of cellulitis.  There was no clinical findings to suggest associated abscess. I personally reviewed and interpreted ultrasound which was negative for DVT in the left lower extremity.  I also personally reviewed and interpreted left tib-fib x-ray which did reveal soft tissue swelling but otherwise no acute osseous abnormality. He overall looks clinically well in no acute distress and hemodynamically stable.  Will treat with Keflex  for what appears to be cellulitis.  Advised him to follow-up with his PCP and wound care.  Also advised him to discontinue using hydroperoxide and to clean daily with soap and water.  We did discussed strict return precautions should the wound worsen in any way.      Final diagnoses:  Wound of left lower extremity, initial encounter    ED Discharge Orders  Ordered    cephALEXin  (KEFLEX ) 500 MG capsule  4 times daily        08/21/23 2009               Aylene Acoff K, PA-C 08/21/23 2013    Albertina Dixon, MD 08/22/23 [phone]

## 2023-08-21 NOTE — Discharge Instructions (Addendum)
 Evaluation today was concerning for what appears to be a skin infection associated with the wound you sustained to your left calf.  I am starting on Keflex .  Please keep the wound clean daily with soap and water.  Please do not use hydrogen peroxide for cleaning.  Also would recommend that you follow-up with wound care as well.  If your symptoms worsen in any way, you have worsening redness, swelling, develop a fever or any other concerning symptom please return to the ED for further evaluation.

## 2023-09-02 ENCOUNTER — Encounter (HOSPITAL_BASED_OUTPATIENT_CLINIC_OR_DEPARTMENT_OTHER): Payer: Self-pay

## 2023-09-02 ENCOUNTER — Other Ambulatory Visit: Payer: Self-pay

## 2023-09-02 ENCOUNTER — Emergency Department (HOSPITAL_BASED_OUTPATIENT_CLINIC_OR_DEPARTMENT_OTHER)
Admission: EM | Admit: 2023-09-02 | Discharge: 2023-09-02 | Disposition: A | Discharge status: Home / Self Care | Attending: Emergency Medicine | Admitting: Emergency Medicine

## 2023-09-02 DIAGNOSIS — Z8673 Personal history of transient ischemic attack (TIA), and cerebral infarction without residual deficits: Secondary | ICD-10-CM | POA: Insufficient documentation

## 2023-09-02 DIAGNOSIS — R600 Localized edema: Secondary | ICD-10-CM | POA: Insufficient documentation

## 2023-09-02 DIAGNOSIS — Z7982 Long term (current) use of aspirin: Secondary | ICD-10-CM | POA: Diagnosis not present

## 2023-09-02 DIAGNOSIS — I129 Hypertensive chronic kidney disease with stage 1 through stage 4 chronic kidney disease, or unspecified chronic kidney disease: Secondary | ICD-10-CM | POA: Diagnosis not present

## 2023-09-02 DIAGNOSIS — Z79899 Other long term (current) drug therapy: Secondary | ICD-10-CM | POA: Insufficient documentation

## 2023-09-02 DIAGNOSIS — E1122 Type 2 diabetes mellitus with diabetic chronic kidney disease: Secondary | ICD-10-CM | POA: Insufficient documentation

## 2023-09-02 DIAGNOSIS — Z4800 Encounter for change or removal of nonsurgical wound dressing: Secondary | ICD-10-CM | POA: Insufficient documentation

## 2023-09-02 DIAGNOSIS — N189 Chronic kidney disease, unspecified: Secondary | ICD-10-CM | POA: Insufficient documentation

## 2023-09-02 DIAGNOSIS — D649 Anemia, unspecified: Secondary | ICD-10-CM | POA: Diagnosis not present

## 2023-09-02 DIAGNOSIS — Z5189 Encounter for other specified aftercare: Secondary | ICD-10-CM

## 2023-09-02 LAB — CBC WITH DIFFERENTIAL/PLATELET
Abs Immature Granulocytes: 0.02 K/uL (ref 0.00–0.07)
Basophils Absolute: 0.1 K/uL (ref 0.0–0.1)
Basophils Relative: 1 %
Eosinophils Absolute: 0.3 K/uL (ref 0.0–0.5)
Eosinophils Relative: 5 %
HCT: 34.5 % — ABNORMAL LOW (ref 39.0–52.0)
Hemoglobin: 11.4 g/dL — ABNORMAL LOW (ref 13.0–17.0)
Immature Granulocytes: 0 %
Lymphocytes Relative: 18 %
Lymphs Abs: 1.1 K/uL (ref 0.7–4.0)
MCH: 29.9 pg (ref 26.0–34.0)
MCHC: 33 g/dL (ref 30.0–36.0)
MCV: 90.6 fL (ref 80.0–100.0)
Monocytes Absolute: 0.6 K/uL (ref 0.1–1.0)
Monocytes Relative: 10 %
Neutro Abs: 4.1 K/uL (ref 1.7–7.7)
Neutrophils Relative %: 66 %
Platelets: 248 K/uL (ref 150–400)
RBC: 3.81 MIL/uL — ABNORMAL LOW (ref 4.22–5.81)
RDW: 14.8 % (ref 11.5–15.5)
WBC: 6.2 K/uL (ref 4.0–10.5)
nRBC: 0 % (ref 0.0–0.2)

## 2023-09-02 LAB — COMPREHENSIVE METABOLIC PANEL WITH GFR
ALT: 13 U/L (ref 0–44)
AST: 23 U/L (ref 15–41)
Albumin: 4 g/dL (ref 3.5–5.0)
Alkaline Phosphatase: 63 U/L (ref 38–126)
Anion gap: 12 (ref 5–15)
BUN: 33 mg/dL — ABNORMAL HIGH (ref 6–20)
CO2: 22 mmol/L (ref 22–32)
Calcium: 9.3 mg/dL (ref 8.9–10.3)
Chloride: 103 mmol/L (ref 98–111)
Creatinine, Ser: 1.94 mg/dL — ABNORMAL HIGH (ref 0.61–1.24)
GFR, Estimated: 40 mL/min — ABNORMAL LOW (ref >60)
Glucose, Bld: 93 mg/dL (ref 70–99)
Potassium: 4 mmol/L (ref 3.5–5.1)
Sodium: 137 mmol/L (ref 135–145)
Total Bilirubin: 0.5 mg/dL (ref 0.0–1.2)
Total Protein: 7.4 g/dL (ref 6.5–8.1)

## 2023-09-02 MED ORDER — CLINDAMYCIN HCL 300 MG PO CAPS: 300.0000 mg | ORAL_CAPSULE | Freq: Three times a day (TID) | ORAL | 0 refills | Status: AC

## 2023-09-02 MED ORDER — CLINDAMYCIN HCL 150 MG PO CAPS
300.0000 mg | ORAL_CAPSULE | Freq: Once | ORAL | Status: AC
  Administered 2023-09-02: 300 mg via ORAL
  Filled 2023-09-02: qty 2

## 2023-09-02 MED ORDER — CHLORHEXIDINE GLUCONATE 4 % EX SOLN: Freq: Every day | CUTANEOUS | 0 refills | Status: AC | PRN

## 2023-09-02 NOTE — ED Triage Notes (Signed)
 Pt POV d/t wound on left calf injury  not healing - a window fell on it and pt was seen here and given Keflex  at that time.

## 2023-09-02 NOTE — ED Provider Notes (Signed)
 [place] EMERGENCY DEPARTMENT AT [facility] Provider Note   CSN: [id number] Arrival date & time: 09/02/23  8057     Patient presents with: Wound Check   [name] [name] is a 57 y.o. male.    Wound Check   58 year old male presents emergency department with complaints of wound on the back of his left calf.  States that he suffered wound late June.  Was seen in the ED on 08/21/2023 had negative ultrasound, reassuring labs and was sent with Keflex .  Patient reports attempting wound care at home and taking antibiotic as prescribed.  States that the area is not getting better.  Denies any fevers, chills, weakness or sensory deficits in affected leg.  Presents emergency department for reevaluation.  Past medical history significant for diabetes mellitus, hypertension, CVA, CKD  Prior to Admission medications   Medication Sig Start Date End Date Taking? Authorizing Provider  aspirin  EC 81 MG tablet Take 1 tablet (81 mg total) by mouth daily. Swallow whole. 05/22/22   Joshua Domino, DO  blood glucose meter kit and supplies KIT Dispense based on patient and insurance preference. Use up to four times daily as directed. 05/06/20   Sponseller, Pleasant R, PA-C  carvedilol (COREG) 25 MG tablet Take 1 tablet by mouth 2 (two) times daily. 01/23/23   [provider]  cephALEXin  (KEFLEX ) 500 MG capsule Take 1 capsule (500 mg total) by mouth 4 (four) times daily. 08/21/23   Robinson, John K, PA-C  cloNIDine  (CATAPRES ) 0.1 MG tablet Take 1 tablet (0.1 mg total) by mouth 2 (two) times daily. 05/24/22   Charlyn Sora, MD  diazepam  (VALIUM ) 5 MG tablet Take 1 tablet (5 mg total) by mouth daily. 06/12/22   Bernis Ernst, PA-C  glucose blood test strip Use as instructed 05/06/20   Sponseller, Rebekah R, PA-C  NIFEdipine  (PROCARDIA  XL/NIFEDICAL-XL) 90 MG 24 hr tablet Take 1 tablet (90 mg total) by mouth daily. 05/23/22   Joshua Domino, DO  NIFEdipine  (PROCARDIA  XL/NIFEDICAL-XL) 90 MG 24 hr tablet Take 90 mg  by mouth 2 (two) times daily. 08/11/21   [provider]  rosuvastatin  (CRESTOR ) 40 MG tablet Take 1 tablet (40 mg total) by mouth at bedtime. 05/22/22   Joshua Domino, DO    Allergies: Hydralazine , Amlodipine , and Prednisone    Review of Systems  All other systems reviewed and are negative.   Updated Vital Signs BP (!) 173/101   Pulse 61   Temp 98.6 F (37 C) (Oral)   Resp 18   Ht 6' (1.829 m)   Wt (!) 140.6 kg   SpO2 99%   BMI 42.04 kg/m   Physical Exam Vitals and nursing note reviewed.  Constitutional:      General: He is not in acute distress.    Appearance: He is well-developed.  HENT:     Head: Normocephalic and atraumatic.  Eyes:     Conjunctiva/sclera: Conjunctivae normal.  Cardiovascular:     Rate and Rhythm: Normal rate and regular rhythm.     Heart sounds: No murmur heard. Pulmonary:     Effort: Pulmonary effort is normal. No respiratory distress.     Breath sounds: Normal breath sounds.  Abdominal:     Palpations: Abdomen is soft.     Tenderness: There is no abdominal tenderness.  Musculoskeletal:        General: No swelling.     Cervical back: Neck supple.     Comments: 2-3+ pitting edema bilateral lower extremities.  Pedal and posttibial  pulses 2+ bilaterally.  Very shallow ulcerative area left posterior calf measuring 6 to 7 cm in diameter.  Erythema along edges of wound without extension or induration present.  No obvious palpable fluctuance or expressible drainage.  Skin:    General: Skin is warm and dry.     Capillary Refill: Capillary refill takes less than 2 seconds.  Neurological:     Mental Status: He is alert.  Psychiatric:        Mood and Affect: Mood normal.     (all labs ordered are listed, but only abnormal results are displayed) Labs Reviewed  CBC WITH DIFFERENTIAL/PLATELET  COMPREHENSIVE METABOLIC PANEL WITH GFR    EKG: None  Radiology: No results found.   Procedures   Medications Ordered in the ED  clindamycin   (CLEOCIN ) capsule 300 mg (has no administration in time range)                                    Medical Decision Making Amount and/or Complexity of Data Reviewed Labs: ordered.  Risk OTC drugs. Prescription drug management.   This patient presents to the ED for concern of wound, this involves an extensive number of treatment options, and is a complaint that carries with it a high risk of complications and morbidity.  The differential diagnosis includes cellulitis, erysipelas, necrotizing infection, abscess, ischemic limb, DVT, other   Co morbidities that complicate the patient evaluation  See HPI   Additional history obtained:  Additional history obtained from EMR External records from outside source obtained and reviewed including hospital records   Lab Tests:  I Ordered, and personally interpreted labs.  The pertinent results include: No leukocytosis.  Anemia hemoglobin 11.4.  Placed within range.  No Electra abnormalities.  Baseline renal function creatinine of 1.94, BUN of 33.  No transaminitis.   Imaging Studies ordered:  N/a   Cardiac Monitoring: / EKG:  The patient was maintained on a cardiac monitor.  I personally viewed and interpreted the cardiac monitored which showed an underlying rhythm of: Sinus rhythm   Consultations Obtained:  N/a   Problem List / ED Course / Critical interventions / Medication management  Wound recheck I ordered medication including clindamycin  Reevaluation of the patient after these medicines showed that the patient improved I have reviewed the patients home medicines and have made adjustments as needed   Social Determinants of Health:  Denies tobacco, licit drug use.   Test / Admission - Considered:  Wound check Vitals signs significant for hypertension. Otherwise within normal range and stable throughout visit. Laboratory studies significant for: See above 58 year old male presents emergency department with  complaints of wound on the back of his left calf.  States that he suffered wound late June.  Was seen in the ED on 08/21/2023 had negative ultrasound, reassuring labs and was sent with Keflex .  Patient reports attempting wound care at home and taking antibiotic as prescribed.  States that the area is not getting better.  Denies any fevers, chills, weakness or sensory deficits in affected leg.  Presents emergency department for reevaluation. On exam, superficial ulcerative area left posterior calf with mild erythema along borders of wound.  Patient without systemic signs of illness does not meet SIRS criteria.  No leukocytosis on blood work.  Desire to get ultrasound imaging of patient's left lower extremity given worsening left lower extremity swelling from baseline but ultrasound not currently available.  Will order  for tomorrow.  DVT ultrasound on patient's prior ED visit 12 days ago was normal.  Patient does have chronic lower extremity swelling, venous stasis changes and has difficulty with wound healing at baseline.  Has had to follow-up with the wound clinic in the past for lower extremity wounds have had difficulty healing.  Patient recently finished Keflex  course.  Will escalate antibiotic therapy to clindamycin .  Recommend continue local wound care at home and will place referral for wound clinic in the outpatient setting.  Treatment plan discussed with patient and he acknowledged understanding was agreeable to said plan.  Patient will well-appearing, afebrile in no acute distress. Worrisome signs and symptoms were discussed with the patient, and the patient acknowledged understanding to return to the ED if noticed. Patient was stable upon discharge.       Final diagnoses:  None    ED Discharge Orders     None          Silver Wonda LABOR, [place] 09/02/23 2211    Bernard Drivers, MD 09/03/23 [phone]

## 2023-09-02 NOTE — Discharge Instructions (Addendum)
 As discussed, lab work appeared normal.  Ultrasound not available today but will order ultrasound to be done tomorrow back here at [facility].  They will call you with scheduled appointment..  Will recommend continue washing area with warm soapy water or Hibiclens  solution.  Put a sterile dressing over top of this.  I will send a referral for wound clinic as well so they can see you for management of this wound.  Please do not hesitate to return if you develop worsening redness, fever or other abnormality we discussed.

## 2023-09-03 ENCOUNTER — Inpatient Hospital Stay (HOSPITAL_BASED_OUTPATIENT_CLINIC_OR_DEPARTMENT_OTHER)
Admission: RE | Admit: 2023-09-03 | Discharge: 2023-09-03 | Discharge status: Home / Self Care | Source: Ambulatory Visit | Attending: Emergency Medicine

## 2023-09-03 DIAGNOSIS — E669 Obesity, unspecified: Secondary | ICD-10-CM | POA: Insufficient documentation

## 2023-09-03 DIAGNOSIS — M7989 Other specified soft tissue disorders: Secondary | ICD-10-CM | POA: Diagnosis present

## 2023-10-23 ENCOUNTER — Encounter (HOSPITAL_BASED_OUTPATIENT_CLINIC_OR_DEPARTMENT_OTHER): Admitting: Internal Medicine

## 2023-11-14 ENCOUNTER — Ambulatory Visit (HOSPITAL_BASED_OUTPATIENT_CLINIC_OR_DEPARTMENT_OTHER): Admitting: Internal Medicine
